# Patient Record
Sex: Female | Born: 1970 | Race: White | Hispanic: Yes | Marital: Married | Smoking: Never smoker
Health system: Southern US, Community
[De-identification: ages and names within clinical notes are randomized; demographics above are authoritative.]

## PROBLEM LIST (undated history)

## (undated) DIAGNOSIS — IMO0002 Reserved for concepts with insufficient information to code with codable children: Secondary | ICD-10-CM

## (undated) DIAGNOSIS — G4730 Sleep apnea, unspecified: Secondary | ICD-10-CM

## (undated) DIAGNOSIS — M5417 Radiculopathy, lumbosacral region: Secondary | ICD-10-CM

## (undated) DIAGNOSIS — R0789 Other chest pain: Secondary | ICD-10-CM

## (undated) DIAGNOSIS — E119 Type 2 diabetes mellitus without complications: Secondary | ICD-10-CM

## (undated) DIAGNOSIS — E1169 Type 2 diabetes mellitus with other specified complication: Secondary | ICD-10-CM

## (undated) DIAGNOSIS — R000 Tachycardia, unspecified: Secondary | ICD-10-CM

## (undated) DIAGNOSIS — E1143 Type 2 diabetes mellitus with diabetic autonomic (poly)neuropathy: Secondary | ICD-10-CM

## (undated) DIAGNOSIS — K219 Gastro-esophageal reflux disease without esophagitis: Secondary | ICD-10-CM

## (undated) DIAGNOSIS — I10 Essential (primary) hypertension: Secondary | ICD-10-CM

## (undated) DIAGNOSIS — K5900 Constipation, unspecified: Secondary | ICD-10-CM

## (undated) DIAGNOSIS — N390 Urinary tract infection, site not specified: Secondary | ICD-10-CM

## (undated) DIAGNOSIS — M545 Low back pain: Secondary | ICD-10-CM

## (undated) DIAGNOSIS — E669 Obesity, unspecified: Secondary | ICD-10-CM

## (undated) DIAGNOSIS — Z6841 Body Mass Index (BMI) 40.0 and over, adult: Secondary | ICD-10-CM

## (undated) HISTORY — DX: Essential (primary) hypertension: I10

## (undated) HISTORY — PX: OTHER SURGICAL HISTORY: SHX169

## (undated) HISTORY — DX: Urinary tract infection, site not specified: N39.0

## (undated) HISTORY — DX: Tachycardia, unspecified: R00.0

## (undated) HISTORY — DX: Sleep apnea, unspecified: G47.30

## (undated) HISTORY — DX: Radiculopathy, lumbosacral region: M54.17

## (undated) HISTORY — DX: Type 2 diabetes mellitus without complications: E11.9

## (undated) HISTORY — DX: Body Mass Index (BMI) 40.0 and over, adult: Z684

## (undated) HISTORY — DX: Gastro-esophageal reflux disease without esophagitis: K21.9

## (undated) HISTORY — DX: Obesity, unspecified: E66.9

## (undated) HISTORY — DX: Type 2 diabetes mellitus with other specified complication: E11.69

## (undated) HISTORY — DX: Low back pain: M54.5

## (undated) HISTORY — DX: Morbid (severe) obesity due to excess calories: E66.01

## (undated) HISTORY — DX: Other chest pain: R07.89

## (undated) HISTORY — DX: Constipation, unspecified: K59.00

## (undated) HISTORY — DX: Type 2 diabetes mellitus with diabetic autonomic (poly)neuropathy: E11.43

---

## 2004-05-20 ENCOUNTER — Ambulatory Visit: Payer: Self-pay | Admitting: Family Medicine

## 2004-07-01 ENCOUNTER — Ambulatory Visit: Payer: Self-pay | Admitting: Family Medicine

## 2004-07-13 ENCOUNTER — Ambulatory Visit: Payer: Self-pay | Admitting: Family Medicine

## 2004-11-29 ENCOUNTER — Ambulatory Visit: Payer: Self-pay | Admitting: Family Medicine

## 2004-12-01 ENCOUNTER — Ambulatory Visit: Payer: Self-pay | Admitting: Family Medicine

## 2005-10-19 ENCOUNTER — Encounter: Admission: RE | Admit: 2005-10-19 | Discharge: 2005-10-19 | Payer: Self-pay | Admitting: Specialist

## 2005-11-09 ENCOUNTER — Encounter: Admission: RE | Admit: 2005-11-09 | Discharge: 2005-11-09 | Payer: Self-pay | Admitting: Specialist

## 2005-11-16 ENCOUNTER — Encounter: Admission: RE | Admit: 2005-11-16 | Discharge: 2005-11-16 | Payer: Self-pay | Admitting: Specialist

## 2005-12-15 ENCOUNTER — Ambulatory Visit (HOSPITAL_COMMUNITY): Admission: RE | Admit: 2005-12-15 | Discharge: 2005-12-16 | Payer: Self-pay | Admitting: Specialist

## 2007-08-01 IMAGING — RF DG MYELOGRAM LUMBAR
14 of 18 series · 14 of 18 positions shown · IV contrast (omnipaque)
Comparison: none

CLINICAL DATA: Low back and bilateral leg pain.  
LUMBAR MYELOGRAM:
TECHNIQUE: Following informed consent, sterile preparation of the back, and adequate local anesthesia, a lumbar puncture was performed using a 22 gauge spinal needle at L2-3 from a right paramedian approach.  Fluid was clear and colorless.  15 cc of Omnipaque 180 was instilled in the subarachnoid space.
TECHNIQUE: Multidetector CT imaging of the lumbar spine was performed after intrathecal injection of contrast.  Multiplanar CT image reconstructions were also generated.

[Series 2: myelogram  white · 1 of 1 slices shown (1 of 14)]
[im 1/1]
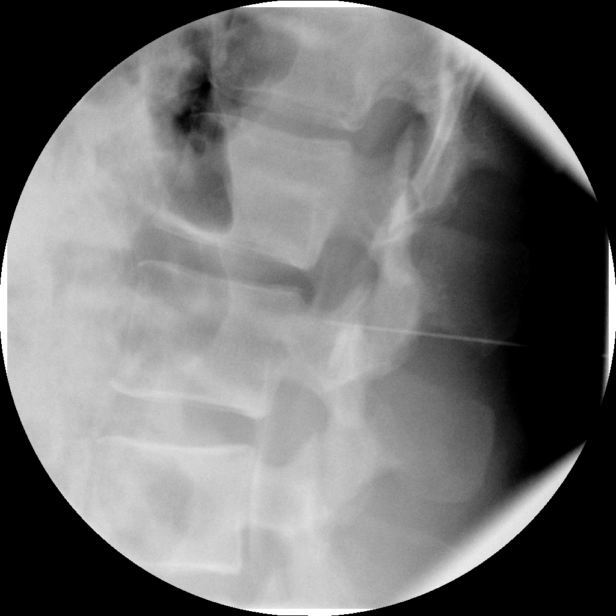

[Series 3: myelogram  white · 1 of 1 slices shown (2 of 14)]
[im 1/1]
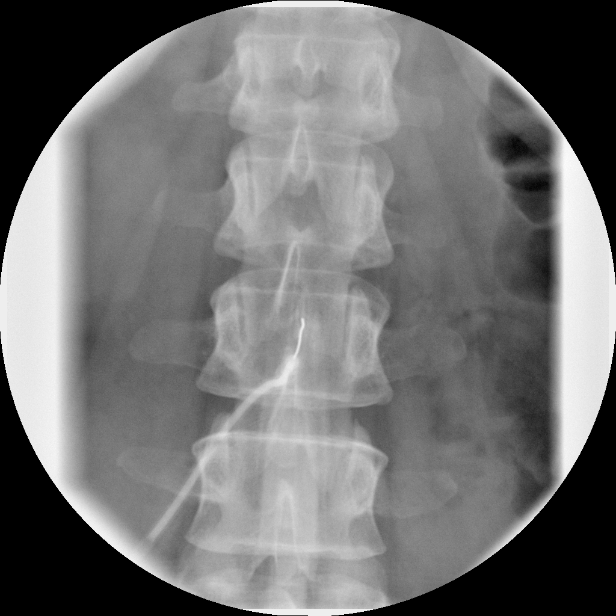

[Series 5: myelogram  white · 1 of 1 slices shown (3 of 14)]
[im 1/1]
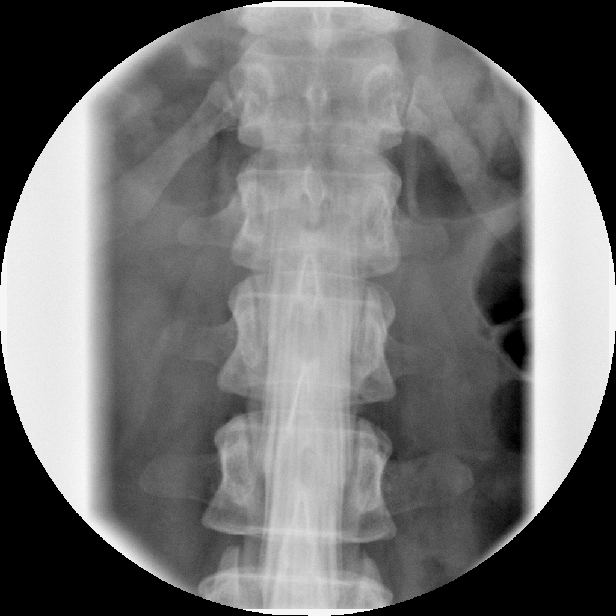

[Series 6: myelogram  white · 1 of 1 slices shown (4 of 14)]
[im 1/1]
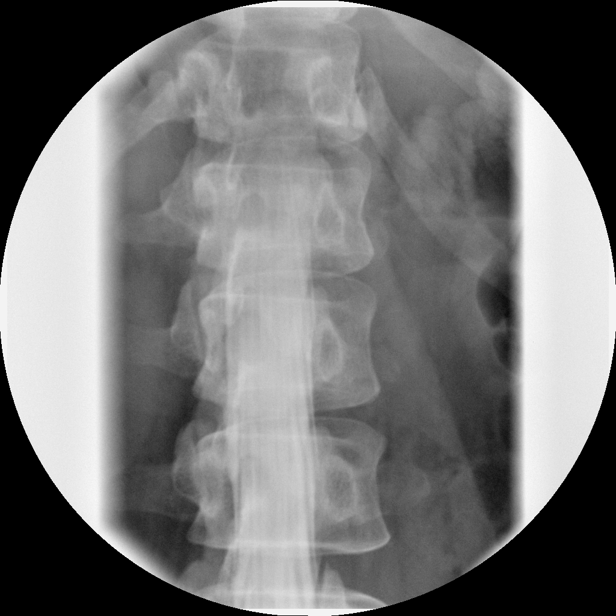

[Series 7: myelogram  white · 1 of 1 slices shown (5 of 14)]
[im 1/1]
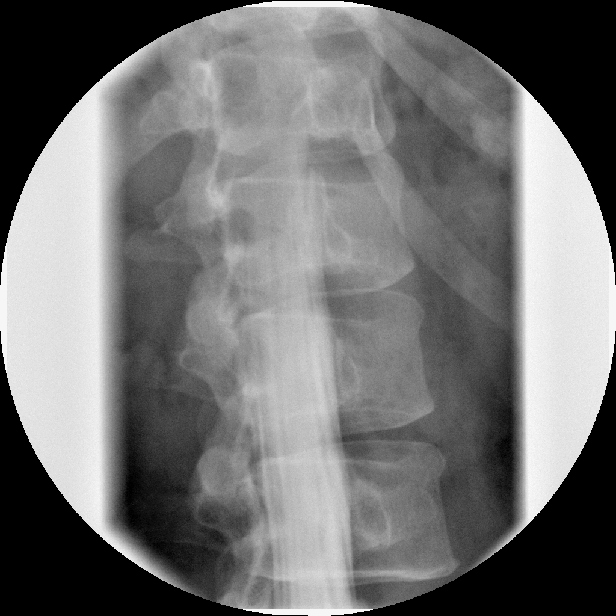

[Series 9: myelogram  white · 1 of 1 slices shown (6 of 14)]
[im 1/1]
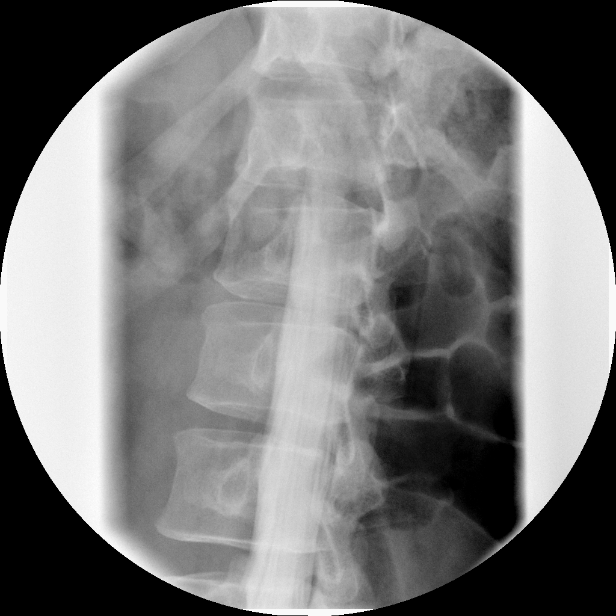

[Series 10: myelogram  white · 1 of 1 slices shown (7 of 14)]
[im 1/1]
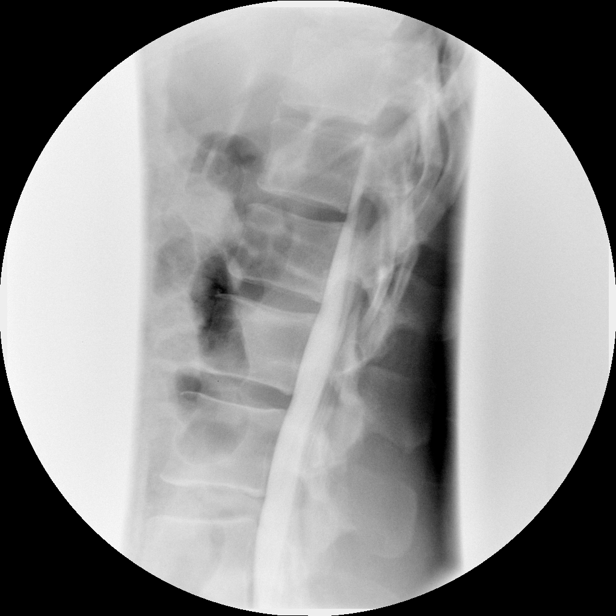

[Series 11: myelogram  white · 1 of 1 slices shown (8 of 14)]
[im 1/1]
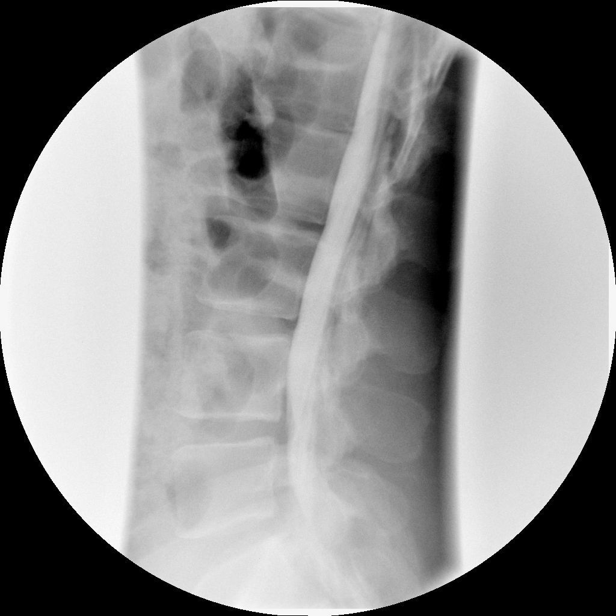

[Series 12: myelogram  white · 1 of 1 slices shown (9 of 14)]
[im 1/1]
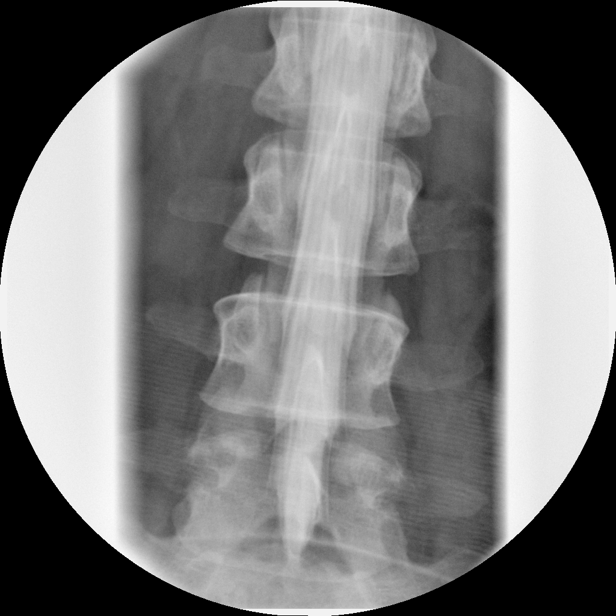

[Series 14: myelogram  white · 1 of 1 slices shown (10 of 14)]
[im 1/1]
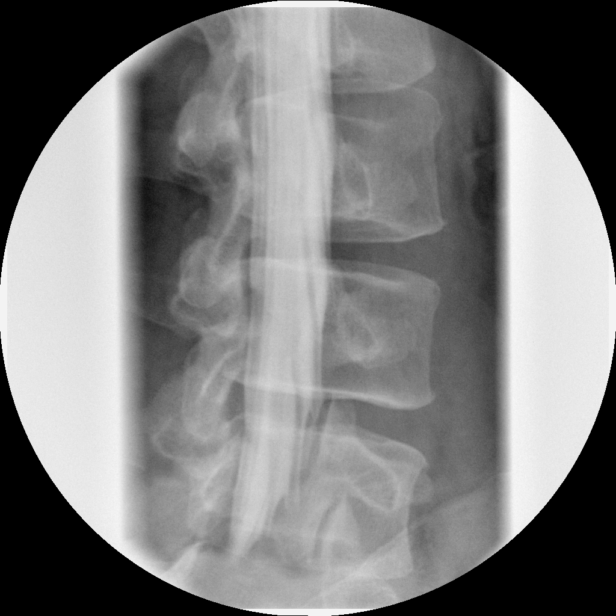

[Series 15: myelogram  white · 1 of 1 slices shown (11 of 14)]
[im 1/1]
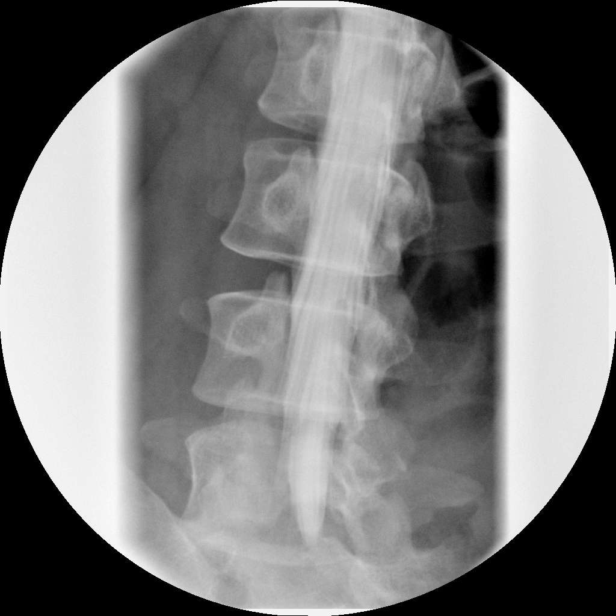

[Series 16: myelogram  white · 1 of 1 slices shown (12 of 14)]
[im 1/1]
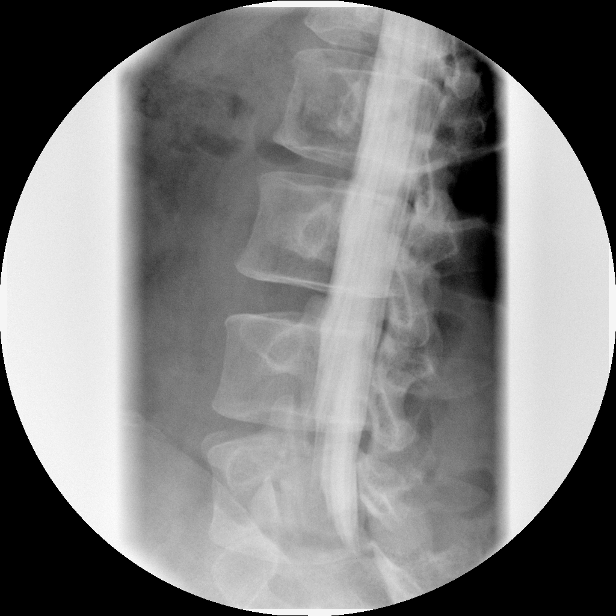

[Series 18: myelogram  white · 1 of 1 slices shown (13 of 14)]
[im 1/1]
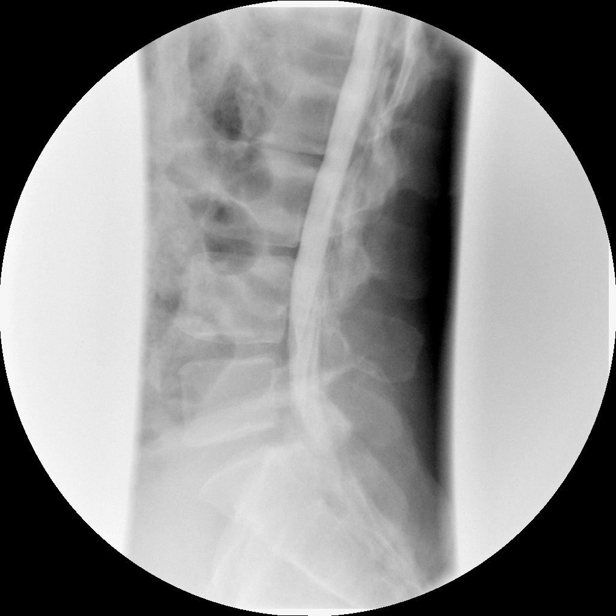

[Series 19: myelogram  white · 1 of 1 slices shown (14 of 14)]
[im 1/1]
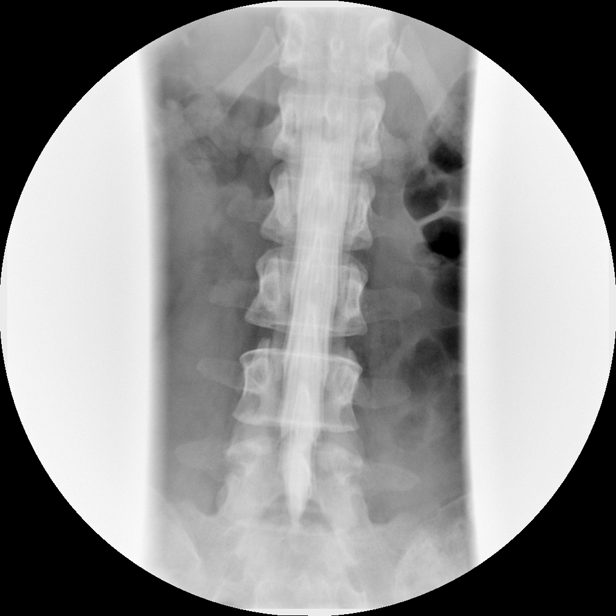

[14 of 18 positions shown; findings below may reference images not displayed]

FINDINGS: AP, lateral, and oblique views demonstrate bilateral extradural defects at L4-5 which appear relatively symmetric.  With the patient standing, the defect is larger on the right.  Side-to-side bending exacerbates the right sided defect when bending to that side.  Flexion/extensions show prominent ventral defects at L4-5 and 
L5-S1, but no abnormal movement or subluxation.
IMPRESSION: Axial loading results in increasing prominence of extradural defects at L4-5.  
POST MYELOGRAM CT SCAN OF THE LUMBAR SPINE:
FINDINGS: The disk spaces at L3-4 and above are normal.  
L4-5:  Calcified central disk protrusion with facet arthropathy.  It contacts, but not does significantly efface, either L5 nerve root.
L5-S1:  Shallow central disk protrusion.  No definite S1 nerve root encroachment.  Mild central osteophyte formation. 
Far right and left parasagittal images show some mild foraminal narrowing, but no significant neural compression.
IMPRESSION: 1.  Calcified central protrusion at L4-5 associated with facet arthropathy; this contacts, but does not significantly displace, the L5 nerve roots with the patient recumbent. 
2.  Central protrusion L5-S1 again without significant neural compression.

## 2007-08-01 IMAGING — CT CT L SPINE W/ CM
2 of 10 series · 10 of 27 positions shown, 12 images · IV contrast (omnipaque)
Comparison: none

CLINICAL DATA: Low back and bilateral leg pain.  
LUMBAR MYELOGRAM:
TECHNIQUE: Following informed consent, sterile preparation of the back, and adequate local anesthesia, a lumbar puncture was performed using a 22 gauge spinal needle at L2-3 from a right paramedian approach.  Fluid was clear and colorless.  15 cc of Omnipaque 180 was instilled in the subarachnoid space.
TECHNIQUE: Multidetector CT imaging of the lumbar spine was performed after intrathecal injection of contrast.  Multiplanar CT image reconstructions were also generated.

[Series 4: recon 3: l spine · axial · 0.27mm/px · z∈[-232,-92]mm · 5 of 351 slices shown, 7 images]
[im 59/351  soft-tissue]
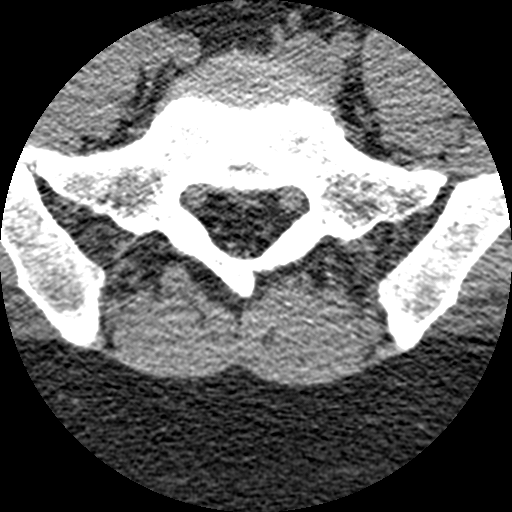
[im 59/351  bone]
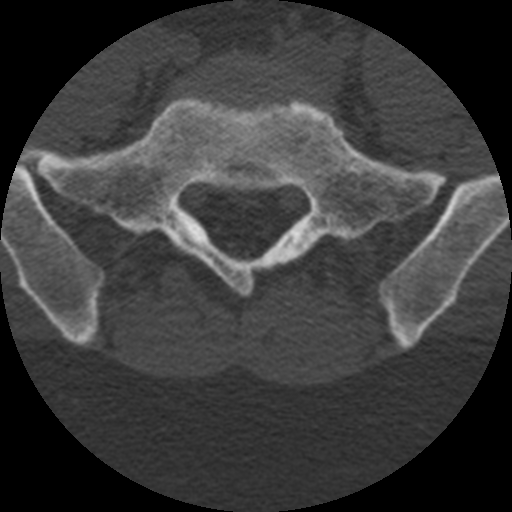
[im 117/351  bone]
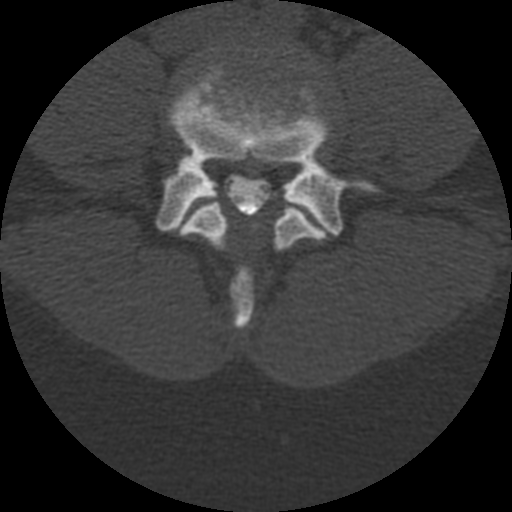
[im 176/351  bone]
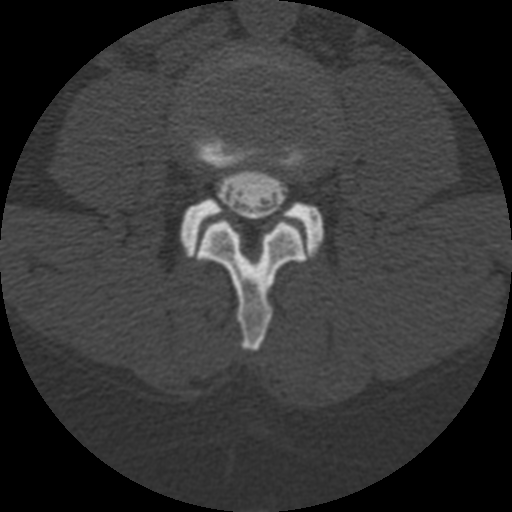
[im 234/351  bone]
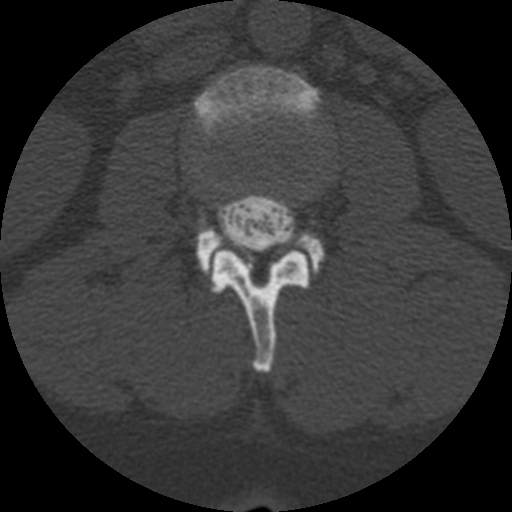
[im 292/351  soft-tissue]
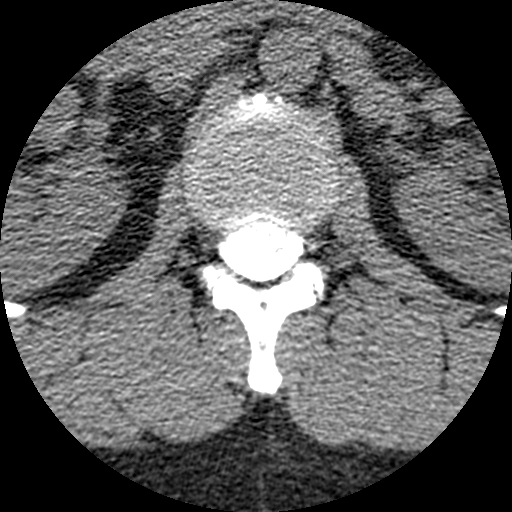
[im 292/351  bone]
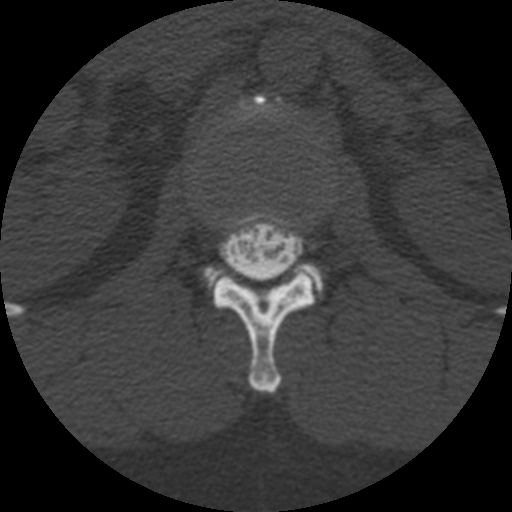

[Series 400: reformatted · coronal · 0.42mm/px · 5 of 40 slices shown]
[im 7/40  bone]
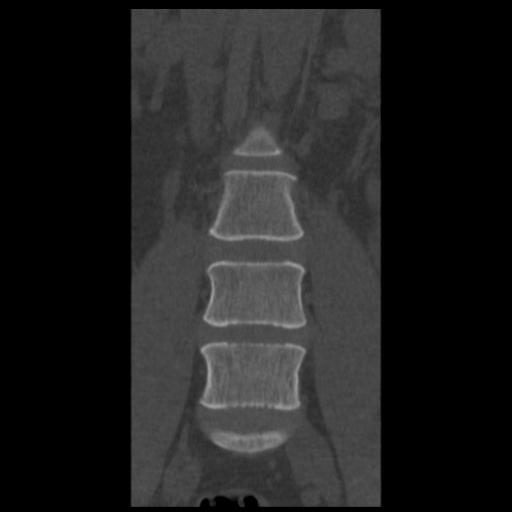
[im 14/40  bone]
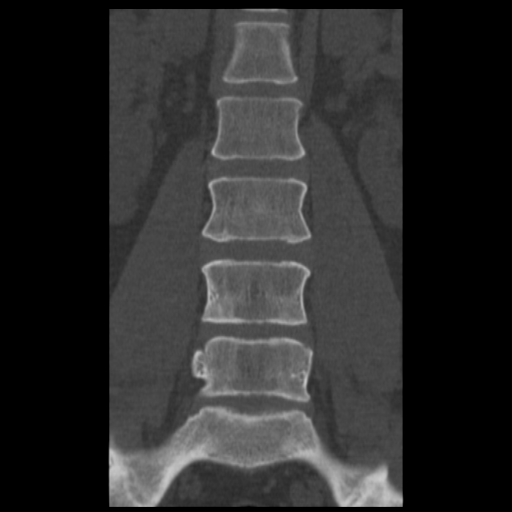
[im 20/40  bone]
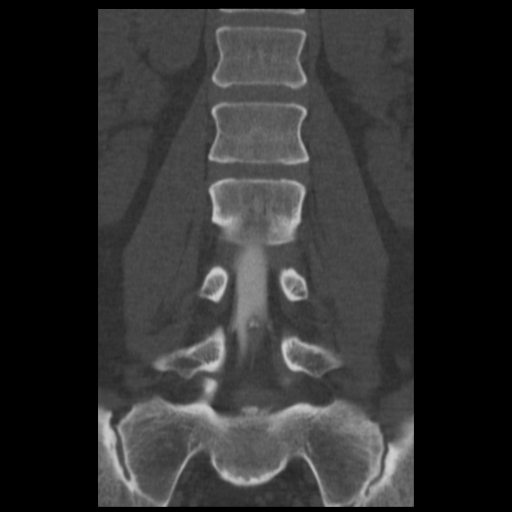
[im 27/40  bone]
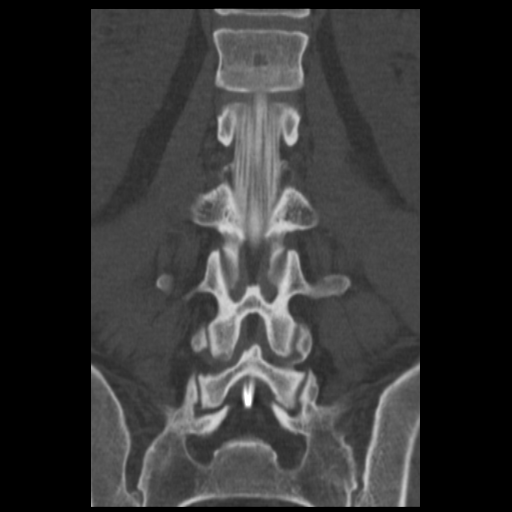
[im 33/40  bone]
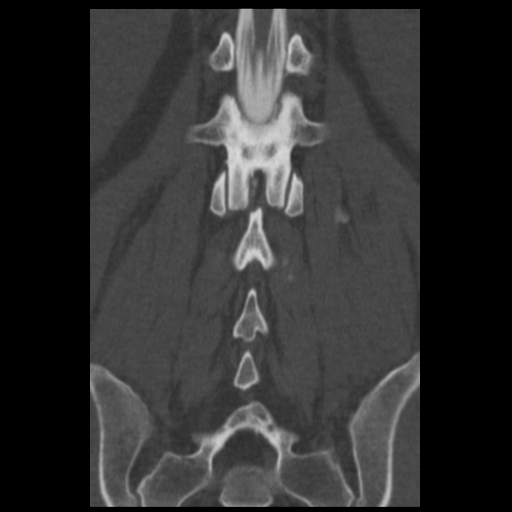

[10 of 27 positions shown; findings below may reference images not displayed]

FINDINGS: AP, lateral, and oblique views demonstrate bilateral extradural defects at L4-5 which appear relatively symmetric.  With the patient standing, the defect is larger on the right.  Side-to-side bending exacerbates the right sided defect when bending to that side.  Flexion/extensions show prominent ventral defects at L4-5 and 
L5-S1, but no abnormal movement or subluxation.
IMPRESSION: Axial loading results in increasing prominence of extradural defects at L4-5.  
POST MYELOGRAM CT SCAN OF THE LUMBAR SPINE:
FINDINGS: The disk spaces at L3-4 and above are normal.  
L4-5:  Calcified central disk protrusion with facet arthropathy.  It contacts, but not does significantly efface, either L5 nerve root.
L5-S1:  Shallow central disk protrusion.  No definite S1 nerve root encroachment.  Mild central osteophyte formation. 
Far right and left parasagittal images show some mild foraminal narrowing, but no significant neural compression.
IMPRESSION: 1.  Calcified central protrusion at L4-5 associated with facet arthropathy; this contacts, but does not significantly displace, the L5 nerve roots with the patient recumbent. 
2.  Central protrusion L5-S1 again without significant neural compression.

## 2011-08-07 ENCOUNTER — Inpatient Hospital Stay (HOSPITAL_COMMUNITY)
Admission: EM | Admit: 2011-08-07 | Discharge: 2011-08-15 | DRG: 460 | Disposition: A | Discharge status: Home / Self Care | Payer: 59 | Attending: Internal Medicine | Admitting: Internal Medicine

## 2011-08-07 ENCOUNTER — Encounter (HOSPITAL_COMMUNITY): Payer: Self-pay | Admitting: Physical Medicine and Rehabilitation

## 2011-08-07 DIAGNOSIS — D72829 Elevated white blood cell count, unspecified: Secondary | ICD-10-CM | POA: Diagnosis present

## 2011-08-07 DIAGNOSIS — M5459 Other low back pain: Secondary | ICD-10-CM

## 2011-08-07 DIAGNOSIS — I10 Essential (primary) hypertension: Secondary | ICD-10-CM | POA: Diagnosis present

## 2011-08-07 DIAGNOSIS — M545 Low back pain: Secondary | ICD-10-CM | POA: Diagnosis present

## 2011-08-07 DIAGNOSIS — M5126 Other intervertebral disc displacement, lumbar region: Principal | ICD-10-CM | POA: Diagnosis present

## 2011-08-07 DIAGNOSIS — K5900 Constipation, unspecified: Secondary | ICD-10-CM | POA: Diagnosis present

## 2011-08-07 DIAGNOSIS — N390 Urinary tract infection, site not specified: Secondary | ICD-10-CM | POA: Diagnosis present

## 2011-08-07 DIAGNOSIS — M549 Dorsalgia, unspecified: Secondary | ICD-10-CM

## 2011-08-07 DIAGNOSIS — M5432 Sciatica, left side: Secondary | ICD-10-CM

## 2011-08-07 DIAGNOSIS — M5417 Radiculopathy, lumbosacral region: Secondary | ICD-10-CM | POA: Diagnosis present

## 2011-08-07 HISTORY — DX: Other low back pain: M54.59

## 2011-08-07 HISTORY — DX: Reserved for concepts with insufficient information to code with codable children: IMO0002

## 2011-08-07 LAB — CBC WITH DIFFERENTIAL/PLATELET
Basophils Absolute: 0 10*3/uL (ref 0.0–0.1)
Eosinophils Relative: 0 % (ref 0–5)
Lymphocytes Relative: 25 % (ref 12–46)
Lymphs Abs: 4 10*3/uL (ref 0.7–4.0)
MCV: 85.1 fL (ref 78.0–100.0)
Neutro Abs: 11 10*3/uL — ABNORMAL HIGH (ref 1.7–7.7)
Neutrophils Relative %: 68 % (ref 43–77)
Platelets: 320 10*3/uL (ref 150–400)
RBC: 4.98 MIL/uL (ref 3.87–5.11)
RDW: 12.3 % (ref 11.5–15.5)
WBC: 16.2 10*3/uL — ABNORMAL HIGH (ref 4.0–10.5)

## 2011-08-07 LAB — BASIC METABOLIC PANEL
CO2: 24 mEq/L (ref 19–32)
Calcium: 8.7 mg/dL (ref 8.4–10.5)
Chloride: 104 mEq/L (ref 96–112)
Glucose, Bld: 86 mg/dL (ref 70–99)
Potassium: 3.2 mEq/L — ABNORMAL LOW (ref 3.5–5.1)
Sodium: 137 mEq/L (ref 135–145)

## 2011-08-07 MED ORDER — KETOROLAC TROMETHAMINE 30 MG/ML IJ SOLN
30.0000 mg | Freq: Once | INTRAMUSCULAR | Status: AC
  Administered 2011-08-07: 30 mg via INTRAVENOUS
  Filled 2011-08-07: qty 1

## 2011-08-07 MED ORDER — HYDROMORPHONE HCL PF 1 MG/ML IJ SOLN
0.5000 mg | INTRAMUSCULAR | Status: DC | PRN
Start: 2011-08-07 — End: 2011-08-11
  Administered 2011-08-08 – 2011-08-11 (×11): 0.5 mg via INTRAVENOUS
  Filled 2011-08-07 (×10): qty 1

## 2011-08-07 MED ORDER — HYDROMORPHONE HCL 2 MG PO TABS: 2.0000 mg | ORAL_TABLET | ORAL | Status: DC | PRN

## 2011-08-07 MED ORDER — HEPARIN SODIUM (PORCINE) 5000 UNIT/ML IJ SOLN
5000.0000 [IU] | Freq: Three times a day (TID) | INTRAMUSCULAR | Status: DC
  Administered 2011-08-08 – 2011-08-09 (×4): 5000 [IU] via SUBCUTANEOUS
  Filled 2011-08-07 (×7): qty 1

## 2011-08-07 MED ORDER — METHOCARBAMOL 100 MG/ML IJ SOLN: 1000.0000 mg | Freq: Once | INTRAMUSCULAR | Status: DC

## 2011-08-07 MED ORDER — NAPROXEN 500 MG PO TABS: 500.0000 mg | ORAL_TABLET | Freq: Two times a day (BID) | ORAL | Status: DC

## 2011-08-07 MED ORDER — FENTANYL 25 MCG/HR TD PT72
25.0000 ug | MEDICATED_PATCH | TRANSDERMAL | Status: DC
  Administered 2011-08-07 – 2011-08-10 (×2): 25 ug via TRANSDERMAL
  Filled 2011-08-07 (×2): qty 1

## 2011-08-07 MED ORDER — HYDROMORPHONE HCL PF 1 MG/ML IJ SOLN: 1.0000 mg | INTRAMUSCULAR | Status: DC | PRN

## 2011-08-07 MED ORDER — HYDROMORPHONE HCL PF 1 MG/ML IJ SOLN
1.0000 mg | Freq: Once | INTRAMUSCULAR | Status: AC
  Administered 2011-08-07: 1 mg via INTRAVENOUS
  Filled 2011-08-07: qty 1

## 2011-08-07 MED ORDER — HYDROMORPHONE HCL PF 1 MG/ML IJ SOLN
1.0000 mg | INTRAMUSCULAR | Status: AC | PRN
  Administered 2011-08-07 – 2011-08-08 (×2): 1 mg via INTRAVENOUS
  Filled 2011-08-07 (×4): qty 1

## 2011-08-07 MED ORDER — SODIUM CHLORIDE 0.9 % IV SOLN
Freq: Once | INTRAVENOUS | Status: AC
  Administered 2011-08-07: 17:00:00 via INTRAVENOUS

## 2011-08-07 MED ORDER — CYCLOBENZAPRINE HCL 10 MG PO TABS
10.0000 mg | ORAL_TABLET | Freq: Three times a day (TID) | ORAL | Status: DC | PRN
  Administered 2011-08-07 – 2011-08-08 (×2): 10 mg via ORAL
  Filled 2011-08-07 (×2): qty 1

## 2011-08-07 MED ORDER — PREDNISONE 20 MG PO TABS
20.0000 mg | ORAL_TABLET | Freq: Two times a day (BID) | ORAL | Status: DC
  Administered 2011-08-07 – 2011-08-11 (×8): 20 mg via ORAL
  Filled 2011-08-07 (×9): qty 1

## 2011-08-07 MED ORDER — KETOROLAC TROMETHAMINE 10 MG PO TABS
10.0000 mg | ORAL_TABLET | Freq: Four times a day (QID) | ORAL | Status: DC | PRN
  Administered 2011-08-09 – 2011-08-11 (×4): 10 mg via ORAL
  Filled 2011-08-07 (×4): qty 1

## 2011-08-07 MED ORDER — ONDANSETRON HCL 4 MG/2ML IJ SOLN: 4.0000 mg | Freq: Three times a day (TID) | INTRAMUSCULAR | Status: AC | PRN

## 2011-08-07 MED ORDER — METHOCARBAMOL 500 MG PO TABS: 1000.0000 mg | ORAL_TABLET | Freq: Four times a day (QID) | ORAL | Status: DC

## 2011-08-07 MED ORDER — METHOCARBAMOL 100 MG/ML IJ SOLN
1000.0000 mg | Freq: Once | INTRAVENOUS | Status: AC
  Administered 2011-08-07: 1000 mg via INTRAVENOUS
  Filled 2011-08-07: qty 10

## 2011-08-07 MED ORDER — ONDANSETRON HCL 4 MG/2ML IJ SOLN
4.0000 mg | Freq: Once | INTRAMUSCULAR | Status: AC
  Administered 2011-08-07: 4 mg via INTRAVENOUS
  Filled 2011-08-07: qty 2

## 2011-08-07 MED ORDER — DOCUSATE SODIUM 100 MG PO CAPS
100.0000 mg | ORAL_CAPSULE | Freq: Two times a day (BID) | ORAL | Status: DC
  Administered 2011-08-07 – 2011-08-11 (×8): 100 mg via ORAL
  Filled 2011-08-07 (×8): qty 1

## 2011-08-07 MED ORDER — ONDANSETRON HCL 4 MG/2ML IJ SOLN: 4.0000 mg | Freq: Four times a day (QID) | INTRAMUSCULAR | Status: DC | PRN

## 2011-08-07 MED ORDER — ONDANSETRON HCL 4 MG PO TABS: 4.0000 mg | ORAL_TABLET | Freq: Four times a day (QID) | ORAL | Status: DC | PRN

## 2011-08-07 MED ORDER — SENNA 8.6 MG PO TABS
1.0000 | ORAL_TABLET | Freq: Two times a day (BID) | ORAL | Status: DC
  Administered 2011-08-07 – 2011-08-11 (×8): 8.6 mg via ORAL
  Filled 2011-08-07 (×9): qty 1

## 2011-08-07 NOTE — ED Notes (Signed)
Report received from Donna, RN.

## 2011-08-07 NOTE — ED Notes (Signed)
Report given to [place], Charity fundraiser.  Pt moving to CDU.

## 2011-08-07 NOTE — H&P (Signed)
[facility]                                                                                    Patient Demographics  Jasmin Gordon, is a 41 y.o. female  CSN: [id number]  MRN: [id number]  DOB - 01-Feb-1971  Admit Date - 08/07/2011  Outpatient Primary MD for the patient is [facility] Valrie Hart., MD   With History of -  Past Medical History  Diagnosis Date  . Herniated disc       No past surgical history on file. C-section  Hysterectomy  in for   Chief Complaint  Patient presents with  . Back Pain  . Leg Pain     HPI  Jasmin Gordon  is a 41 y.o. 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 Hulda Humphrey at [facility], where she was given a 3 Depo shots in March April and May, with initial improvement of pain, but patient was supposed to follow with [facility] in [place] for second opinion for possible need of surgery but she did not have her appointment, vision denies any focal deficits any urine incontinence and stool incontinence and loss of sensation dysuria polyuria chest pain shortness of breath.   Review of Systems    In addition to the HPI above,  No Fever-chills, No Headache, No changes with Vision or hearing, No problems swallowing food or Liquids, No Chest pain, Cough or Shortness of Breath, No Abdominal pain, No Nausea or Vommitting, Bowel movements are regular, No Blood in stool or Urine, No dysuria, No new skin rashes  or bruises, Complaints of intractable lower back pain radiating to the left lower, denies any urine incontinence and stool incontinence or paresthesia, or focal deficit No new weakness, tingling, numbness in any extremity, No recent weight gain or loss, No polyuria, polydypsia or polyphagia, No significant Mental Stressors.  A full 10 point Review of Systems was done, except as stated above, all other Review of Systems were negative.   Social History History  Substance Use Topics  . Smoking status: Never Smoker   . Smokeless tobacco: Not on file  . Alcohol Use: No     Family History No family history on file.   Prior to Admission medications   Medication Sig Start Date End Date Taking? Authorizing Provider  predniSONE (DELTASONE) 20 MG tablet Take 20 mg by mouth 2 (two) times daily.   Yes Historical Provider, MD  HYDROmorphone (DILAUDID) 2 MG tablet Take 1 tablet (2 mg total) by mouth every 4 (four) hours as needed for pain. 08/07/11 08/17/11  Dione Booze, MD  methocarbamol (ROBAXIN) 500 MG tablet Take 2 tablets (1,000 mg total)  by mouth 4 (four) times daily. 08/07/11 08/17/11  Dione Booze, MD  naproxen (NAPROSYN) 500 MG tablet Take 1 tablet (500 mg total) by mouth 2 (two) times daily. 08/07/11 08/06/12  Dione Booze, MD    No Known Allergies  Physical Exam  Vitals  Blood pressure 149/86, pulse 87, temperature 98 F (36.7 C), temperature source Oral, resp. rate 24, SpO2 98.00%.   1. General obese female lying in bed in mild distress due to pain  2. Normal affect and insight, Not Suicidal or Homicidal, Awake Alert, Oriented X 3.  3. No F.N deficits, ALL C.Nerves Intact, Strength 5/5 all 4 extremities, Sensation intact all 4 extremities, Plantars down going. Movement is limited in left lower extremity due to pain, positive leg raise test  left leg, and has left paravertebral tenderness to palpation  4. Ears and Eyes appear Normal, Conjunctivae clear, PERRLA. Moist Oral Mucosa.  5.  Supple Neck, No JVD, No cervical lymphadenopathy appriciated, No Carotid Bruits.  6. Symmetrical Chest wall movement, Good air movement bilaterally, CTAB.  7. RRR, No Gallops, Rubs or Murmurs, No Parasternal Heave.  8. Positive Bowel Sounds, Abdomen Soft, Non tender, No organomegaly appriciated,No rebound -guarding or rigidity.  9.  No Cyanosis, Normal Skin Turgor, No Skin Rash or Bruise.  10. Good muscle tone,  joints appear normal , no effusions, .  11. No Palpable Lymph Nodes in Neck or Axillae   Data Review  CBC No results found for this basename: WBC:5,HGB:5,HCT:5,PLT:5,MCV:5,MCH:5,MCHC:5,RDW:5,NEUTRABS:5,LYMPHSABS:5,MONOABS:5,EOSABS:5,BASOSABS:5,BANDABS:5,BANDSABD:5 in the last 168 hours ------------------------------------------------------------------------------------------------------------------  Chemistries  No results found for this basename: NA:5,K:5,CL:5,CO2:5,GLUCOSE:5,BUN:5,CREATININE:5,GFRCGP,:5,CALCIUM:5,MG:5,AST:5,ALT:5,ALKPHOS:5,BILITOT:5 in the last 168 hours ------------------------------------------------------------------------------------------------------------------ CrCl is unknown because no creatinine reading has been taken and the patient has no height on file. ------------------------------------------------------------------------------------------------------------------ No results found for this basename: TSH,T4TOTAL,FREET3,T3FREE,THYROIDAB in the last 72 hours   Coagulation profile No results found for this basename: INR:5,PROTIME:5 in the last 168 hours ------------------------------------------------------------------------------------------------------------------- No results found for this basename: DDIMER:2 in the last 72 hours -------------------------------------------------------------------------------------------------------------------  Cardiac Enzymes No results found for this basename: CK:3,CKMB:3,TROPONINI:3,MYOGLOBIN:3 in the last  168 hours ------------------------------------------------------------------------------------------------------------------ No components found with this basename: POCBNP:3   ---------------------------------------------------------------------------------------------------------------  Urinalysis No results found for this basename: colorurine, appearanceur, labspec, phurine, glucoseu, hgbur, bilirubinur, ketonesur, proteinur, urobilinogen, nitrite, leukocytesur    ----------------------------------------------------------------------------------------------------------------    Imaging results:   No results found.   Assessment & Plan  Principal Problem:  *Intractable low back pain    1. intractable back pain. Patient has history of disc desiccation in L4-L5, L5-S1, status post left laminectomy in L4, with recurrent lower back pain, status post 3 Depo shots, recently started on by mouth prednisone, 20 daily last Wednesday, will continue her on when necessary Dilauded, continue her on Flexeril when necessary, and oral toradol prn, and we'll start her on fentanyl patch, and if pain is hard to control will consult [facility] neurosurgery.    DVT Prophylaxis Heparin  AM Labs Ordered, also please review Full Orders  Admission, patients condition and plan of care including tests being ordered have been discussed with the patient  who indicate understanding and agree with the plan and Code Status.  Code Status full  Condition fair  Brie Eppard M.D on 08/07/2011 at 7:45 PM

## 2011-08-07 NOTE — ED Notes (Addendum)
Pt presents to department for evaluation of lower back pain and L leg pain. States she lost her balance and fell this morning, was able to stop herself with hands. Pt states history of herniated disk. 10/10 pain upon arrival to ED. Pt is tearful and crying. No obvious deformities noted. She is conscious alert and oriented x4. Pt states she recently went to [facility] for same recently, no relief from pain with steroid or muscle relaxer.

## 2011-08-07 NOTE — ED Provider Notes (Signed)
History   This chart was scribed for Dione Booze, MD scribed by Magnus Sinning. The patient was seen in room TR11C/TR11C seen at 13:32   CSN: [id number]  Arrival date & time 08/07/11  1210   None     Chief Complaint  Patient presents with  . Back Pain  . Leg Pain    (Consider location/radiation/quality/duration/timing/severity/associated sxs/prior treatment) HPI Jasmin Gordon is a 41 y.o. female who presents to the Emergency Department complaining of gradually worsening severe burning, throbbing, and aching lower left back pain that radiates into left leg, onset 1 week. Also reports associated weakness. She explains back pain wasn't bad initially, but says now it is aggravated severely with walking and sitting. She states it used to be modified by laying down, but says it now causes pain.  She states she was prescribed oxycodone to take for chronic back pain, but states it has not worked because it just makes her sleep. Patient states she was dxd with herniated disk with Dr. Kathrene Bongo approximately 5 months ago. She says she has been treated with depo shots, which she explains worked very well. However, she states orthopedist also noted possible need for surgery. Patient does not smoke or consume ETOH. Denies incontinence.  Past Medical History  Diagnosis Date  . Herniated disc     No past surgical history on file.  No family history on file.  History  Substance Use Topics  . Smoking status: Never Smoker   . Smokeless tobacco: Not on file  . Alcohol Use: No   Review of Systems  Musculoskeletal: Positive for back pain.       Leg pain    Allergies  Review of patient's allergies indicates no known allergies.  Home Medications  No current outpatient prescriptions on file.  BP 169/115  Pulse 101  Temp 97 F (36.1 C) (Oral)  Resp 22  SpO2 96%  Physical Exam  Nursing note and vitals reviewed. Constitutional: She is oriented to person, place, and time. She appears  well-developed and well-nourished. No distress.       In pain  HENT:  Head: Normocephalic and atraumatic.  Eyes: EOM are normal.  Neck: Neck supple. No tracheal deviation present.  Cardiovascular: Normal rate.   Pulmonary/Chest: Effort normal. No respiratory distress.  Musculoskeletal: Normal range of motion. She exhibits tenderness.       Mild tenderness to lumbar spine with left para lumbar tenderness and spasm. Positive straight leg raise on the left at 30 degrees. Positive straight leg raise on the right at 45 degrees  Neurological: She is alert and oriented to person, place, and time.  Skin: Skin is warm and dry.  Psychiatric: She has a normal mood and affect. Her behavior is normal.    ED Course  Procedures (including critical care time) DIAGNOSTIC STUDIES: Oxygen Saturation is 96% on room air, normal by my interpretation.    COORDINATION OF CARE:  15:22:EDMD performs recheck. Patient reports feeling better and is ready for d/c.  17:06: EDMD notes: Patient unable to get out of bed and will need further evaluation.   17:09: EDMD performs recheck. Patient reports that she is still in pain. EDMD also provides options for treatment and possible plans for admittance for further EVAL.   18:52: EDMD provides that patient did not have relief with Dilaudid injections and provides plan to admit for further eval.  Results for orders placed during the hospital encounter of 08/07/11  CBC WITH DIFFERENTIAL  Component Value Range   WBC 16.2 (*) 4.0 - 10.5 K/uL   RBC 4.98  3.87 - 5.11 MIL/uL   Hemoglobin 14.9  12.0 - 15.0 g/dL   HCT 16.1  09.6 - 04.5 %   MCV 85.1  78.0 - 100.0 fL   MCH 29.9  26.0 - 34.0 pg   MCHC 35.1  30.0 - 36.0 g/dL   RDW 40.9  81.1 - 91.4 %   Platelets 320  150 - 400 K/uL   Neutrophils Relative 68  43 - 77 %   Neutro Abs 11.0 (*) 1.7 - 7.7 K/uL   Lymphocytes Relative 25  12 - 46 %   Lymphs Abs 4.0  0.7 - 4.0 K/uL   Monocytes Relative 7  3 - 12 %    Monocytes Absolute 1.1 (*) 0.1 - 1.0 K/uL   Eosinophils Relative 0  0 - 5 %   Eosinophils Absolute 0.0  0.0 - 0.7 K/uL   Basophils Relative 0  0 - 1 %   Basophils Absolute 0.0  0.0 - 0.1 K/uL  BASIC METABOLIC PANEL      Component Value Range   Sodium 137  135 - 145 mEq/L   Potassium 3.2 (*) 3.5 - 5.1 mEq/L   Chloride 104  96 - 112 mEq/L   CO2 24  19 - 32 mEq/L   Glucose, Bld 86  70 - 99 mg/dL   BUN 11  6 - 23 mg/dL   Creatinine, Ser 7.82 (*) 0.50 - 1.10 mg/dL   Calcium 8.7  8.4 - 95.6 mg/dL   GFR calc non Af Amer >90  >90 mL/min   GFR calc Af Amer >90  >90 mL/min  URINALYSIS, ROUTINE W REFLEX MICROSCOPIC      Component Value Range   Color, Urine YELLOW  YELLOW   APPearance CLOUDY (*) CLEAR   Specific Gravity, Urine 1.024  1.005 - 1.030   pH 6.0  5.0 - 8.0   Glucose, UA NEGATIVE  NEGATIVE mg/dL   Hgb urine dipstick TRACE (*) NEGATIVE   Bilirubin Urine NEGATIVE  NEGATIVE   Ketones, ur NEGATIVE  NEGATIVE mg/dL   Protein, ur NEGATIVE  NEGATIVE mg/dL   Urobilinogen, UA 0.2  0.0 - 1.0 mg/dL   Nitrite POSITIVE (*) NEGATIVE   Leukocytes, UA MODERATE (*) NEGATIVE  URINE MICROSCOPIC-ADD ON      Component Value Range   Squamous Epithelial / LPF FEW (*) RARE   WBC, UA 11-20  <3 WBC/hpf   RBC / HPF 0-2  <3 RBC/hpf   Bacteria, UA MANY (*) RARE  URINE CULTURE      Component Value Range   Specimen Description URINE, CLEAN CATCH     Special Requests NONE     Culture  Setup Time 08/09/2011 06:13     Colony Count 20,OOO COLONIES/ML     Culture       Value: Multiple bacterial morphotypes present, none predominant. Suggest appropriate recollection if clinically indicated.   Report Status 08/10/2011 FINAL    CBC      Component Value Range   WBC 15.6 (*) 4.0 - 10.5 K/uL   RBC 5.25 (*) 3.87 - 5.11 MIL/uL   Hemoglobin 15.6 (*) 12.0 - 15.0 g/dL   HCT 21.3  08.6 - 57.8 %   MCV 84.2  78.0 - 100.0 fL   MCH 29.7  26.0 - 34.0 pg   MCHC 35.3  30.0 - 36.0 g/dL   RDW 46.9  62.9 - 52.8 %  Platelets 346  150 - 400 K/uL  BASIC METABOLIC PANEL      Component Value Range   Sodium 136  135 - 145 mEq/L   Potassium 3.3 (*) 3.5 - 5.1 mEq/L   Chloride 99  96 - 112 mEq/L   CO2 24  19 - 32 mEq/L   Glucose, Bld 171 (*) 70 - 99 mg/dL   BUN 13  6 - 23 mg/dL   Creatinine, Ser 6.44 (*) 0.50 - 1.10 mg/dL   Calcium 9.5  8.4 - 03.4 mg/dL   GFR calc non Af Amer >90  >90 mL/min   GFR calc Af Amer >90  >90 mL/min  CBC      Component Value Range   WBC 14.3 (*) 4.0 - 10.5 K/uL   RBC 5.66 (*) 3.87 - 5.11 MIL/uL   Hemoglobin 16.7 (*) 12.0 - 15.0 g/dL   HCT 74.2 (*) 59.5 - 63.8 %   MCV 85.0  78.0 - 100.0 fL   MCH 29.5  26.0 - 34.0 pg   MCHC 34.7  30.0 - 36.0 g/dL   RDW 75.6  43.3 - 29.5 %   Platelets 367  150 - 400 K/uL  BASIC METABOLIC PANEL      Component Value Range   Sodium 138  135 - 145 mEq/L   Potassium 4.4  3.5 - 5.1 mEq/L   Chloride 99  96 - 112 mEq/L   CO2 24  19 - 32 mEq/L   Glucose, Bld 149 (*) 70 - 99 mg/dL   BUN 14  6 - 23 mg/dL   Creatinine, Ser 1.88 (*) 0.50 - 1.10 mg/dL   Calcium 9.8  8.4 - 41.6 mg/dL   GFR calc non Af Amer >90  >90 mL/min   GFR calc Af Amer >90  >90 mL/min   Mr Lumbar Spine W Wo Contrast  08/09/2011  *RADIOLOGY REPORT*  Clinical Data: Increasing low back pain and left lower extremity radiculopathy.  MRI LUMBAR SPINE WITHOUT AND WITH CONTRAST  Technique:  Multiplanar and multiecho pulse sequences of the lumbar spine were obtained without and with intravenous contrast.  Contrast: 20mL MULTIHANCE GADOBENATE DIMEGLUMINE 529 MG/ML IV SOLN  Comparison: MRI dated 03/11/2011 and radiographs dated 03/06/2011  Findings: Scan extends from T11-12 through S4.  Tip of the conus is at L1-2 with a tiny lipoma of the filum terminalis.  Normal paraspinal soft tissues.  T10-11 through L3-4: Normal.  L4-5:  There is a recurrent disc extrusion into the left lateral recess compressing the left L5 nerve root sleeve.  Evidence of previous left hemilaminotomy.  After contrast  administration there is slight enhancement around the thecal sac.  The extrusion is new since 03/11/2011.  L5-S1:  There is a focal central disc protrusion which does not touch the nerve roots or thecal sac.  There is enhancing epidural fibrosis around the left S1 nerve root sleeve.  This does have a slight mass effect upon the left S1 nerve root sleeve.  IMPRESSION:  1.  New disc extrusion into the left lateral recess at L4-5 compressing the left L5 nerve root. 2.  Small central disc protrusion at L5-S1 without neural impingement. 3.  Enhancing scar tissue around the left S1 nerve root sleeve at L5-S1.  Original Report Authenticated By: Gwynn Burly, M.D.      1. Sciatica of left side       MDM  Severe lumbar pain. She will be given treatment with IV hydromorphone and methocarbamol and reevaluated.  No relief despite multiple injections of hydromorphone and also methocarbamol. I have discussed with the radiologist was able to get her MRI from [facility] and she has severe multilevel disc disease. She'll need to be admitted for pain control and neurosurgical consultation. Case is discussed with [facility] who agreed to admit the patient.  I personally performed the services described in this documentation, which was scribed in my presence. The recorded information has been reviewed and considered.           Dione Booze, MD 08/11/11 [phone]

## 2011-08-08 ENCOUNTER — Observation Stay (HOSPITAL_COMMUNITY): Payer: 59

## 2011-08-08 DIAGNOSIS — M5417 Radiculopathy, lumbosacral region: Secondary | ICD-10-CM

## 2011-08-08 DIAGNOSIS — M5430 Sciatica, unspecified side: Secondary | ICD-10-CM

## 2011-08-08 DIAGNOSIS — M545 Low back pain, unspecified: Secondary | ICD-10-CM

## 2011-08-08 DIAGNOSIS — N390 Urinary tract infection, site not specified: Secondary | ICD-10-CM | POA: Diagnosis present

## 2011-08-08 HISTORY — DX: Radiculopathy, lumbosacral region: M54.17

## 2011-08-08 HISTORY — DX: Urinary tract infection, site not specified: N39.0

## 2011-08-08 LAB — URINALYSIS, ROUTINE W REFLEX MICROSCOPIC
Glucose, UA: NEGATIVE mg/dL
Protein, ur: NEGATIVE mg/dL
Specific Gravity, Urine: 1.024 (ref 1.005–1.030)

## 2011-08-08 LAB — URINE MICROSCOPIC-ADD ON

## 2011-08-08 MED ORDER — PANTOPRAZOLE SODIUM 40 MG PO TBEC
40.0000 mg | DELAYED_RELEASE_TABLET | Freq: Every day | ORAL | Status: DC
  Administered 2011-08-08 – 2011-08-11 (×4): 40 mg via ORAL
  Filled 2011-08-08 (×3): qty 1

## 2011-08-08 MED ORDER — GADOBENATE DIMEGLUMINE 529 MG/ML IV SOLN
20.0000 mL | Freq: Once | INTRAVENOUS | Status: AC | PRN
  Administered 2011-08-08: 20 mL via INTRAVENOUS

## 2011-08-08 MED ORDER — CIPROFLOXACIN HCL 500 MG PO TABS
500.0000 mg | ORAL_TABLET | Freq: Two times a day (BID) | ORAL | Status: DC
  Administered 2011-08-08 – 2011-08-11 (×7): 500 mg via ORAL
  Filled 2011-08-08 (×11): qty 1

## 2011-08-08 MED ORDER — IBUPROFEN 600 MG PO TABS
600.0000 mg | ORAL_TABLET | Freq: Three times a day (TID) | ORAL | Status: DC
  Administered 2011-08-08 – 2011-08-09 (×3): 600 mg via ORAL
  Filled 2011-08-08 (×7): qty 1

## 2011-08-08 MED ORDER — CYCLOBENZAPRINE HCL 5 MG PO TABS
5.0000 mg | ORAL_TABLET | Freq: Three times a day (TID) | ORAL | Status: DC
  Administered 2011-08-08 – 2011-08-11 (×10): 5 mg via ORAL
  Filled 2011-08-08 (×13): qty 1

## 2011-08-08 NOTE — Progress Notes (Signed)
Utilization review complete 

## 2011-08-08 NOTE — Progress Notes (Signed)
Subjective: Still has considerable back and left leg pain.  Objective: Vital signs in last 24 hours: Temp:  [97 F (36.1 C)-98.9 F (37.2 C)] 98.4 F (36.9 C) (07/08 0522) Pulse Rate:  [87-101] 90  (07/08 0522) Resp:  [16-24] 16  (07/08 0522) BP: (134-169)/(85-115) 148/90 mmHg (07/08 0522) SpO2:  [95 %-98 %] 95 % (07/08 0522) Weight:  [107.956 kg (238 lb)] 107.956 kg (238 lb) (07/07 2307) Weight change:  Last BM Date: 08/06/11  Intake/Output from previous day:       Physical Exam: General:  Alert, communicative, fully oriented, not short of breath at rest.  HEENT:  No clinical pallor, no jaundice, no conjunctival injection or discharge. Hydration status is satisfactory. NECK:  Supple, JVP not seen, no carotid bruits, no palpable lymphadenopathy, no palpable goiter. CHEST:  Clinically clear to auscultation, no wheezes, no crackles. HEART:  Sounds 1 and 2 heard, normal, regular, no murmurs. ABDOMEN:  Morbidly obese, soft, non-tender, no palpable organomegaly, no palpable masses, normal bowel sounds. GENITALIA:  Not examined. LOWER EXTREMITIES:  No pitting edema, palpable peripheral pulses. MUSCULOSKELETAL SYSTEM:  Has tenderness lower lumbar/sacral spine, with left paraspinal muscle spasm. Unable to attempt straight-leg raising, secondary to significant pain down back of left leg, on slightest movement. CENTRAL NERVOUS SYSTEM:  No focal neurologic deficit on gross examination.  Lab Results:  [facility] 08/07/11 1917  WBC 16.2*  HGB 14.9  HCT 42.4  PLT 320    Basename 08/07/11 1917  NA 137  K 3.2*  CL 104  CO2 24  GLUCOSE 86  BUN 11  CREATININE 0.45*  CALCIUM 8.7   No results found for this or any previous visit (from the past 240 hour(s)).   Studies/Results: No results found.  Medications: Scheduled Meds:   . sodium chloride   Intravenous Once  . docusate sodium  100 mg Oral BID  . fentaNYL  25 mcg Transdermal Q72H  . heparin  5,000 Units Subcutaneous Q8H    . HYDROmorphone  1 mg Intravenous Once  . HYDROmorphone  1 mg Intravenous Once  . HYDROmorphone  1 mg Intravenous Once  . ketorolac  30 mg Intravenous Once  . methocarbamol (ROBAXIN) IV  1,000 mg Intravenous Once  . ondansetron (ZOFRAN) IV  4 mg Intravenous Once  . predniSONE  20 mg Oral BID  . senna  1 tablet Oral BID  . DISCONTD: methocarbamol  1,000 mg Intravenous Once   Continuous Infusions:  PRN Meds:.cyclobenzaprine, HYDROmorphone, HYDROmorphone (DILAUDID) injection, ketorolac, ondansetron (ZOFRAN) IV, ondansetron (ZOFRAN) IV, ondansetron, DISCONTD:  HYDROmorphone (DILAUDID) injection  Assessment/Plan:  Active Problems: 1. Intractable low back pain/Left radiculopathy:  Patient has history of disc desiccation in L4-L5, L5-S1, status post left laminectomy in L4. Since the beginning of this year, she has had recurrent lower back pain, and is now status post status 3 Depo shots. Oral prednisone was started on 08/03/11. Symptoms have steadily progressed, and she is now unable to ambulate. Clinically, she has a radiculopathy. Managing with analgesics, and muscle relaxants. Will do MRI, to define anatomy, and consult neurosurgery, dependent on MRI findings. PT/OT, as tolerated.  2. UTI: Patient has a positive urinary sediment, with pyuria and bacteriuria. Will start oral ciprofloxacin, and await urine culture.     LOS: 1 day   Nihar Klus,CHRISTOPHER 08/08/2011, 10:34 AM

## 2011-08-08 NOTE — Progress Notes (Signed)
Physical Therapy Evaluation Patient Details Name: Jasmin Gordon MRN: [id number] DOB: 25-May-1970 Today's Date: 08/08/2011 Time: 4098-1191 PT Time Calculation (min): 27 min  PT Assessment / Plan / Recommendation Clinical Impression  Pt is a 41 yo female with past history of lumbar laminectomy and disc herniation L3-4 on MRI in 4/13 who presents with worsening of LBP and LLE pain as well as elevated WBC.  Pt is limited in all activity by pain.  Was able to position her in prone on her bed which did help relive pain slightly, all other positions aggravated pain.  Ice applied to low back.  Difficult to make d/c recommendations at this point since it is unclear how she will be managed but she will need acute PT for mobility in this setting as well as some sort of f/u PT (HH vs OP) depending on hospital course.      PT Assessment  Patient needs continued PT services    Follow Up Recommendations  Home health PT;Outpatient PT;Other (comment) (depending on mgmt of back pain)    Barriers to Discharge None      Equipment Recommendations  Other (comment) (TBD)    Recommendations for Other Services OT consult   Frequency Min 5X/week    Precautions / Restrictions Precautions Precautions: Back;Fall Precaution Booklet Issued: No Precaution Comments: precautions not formal as pt has not had back surgery but eduacted for safe mobility with back pain Restrictions Weight Bearing Restrictions: No   Pertinent Vitals/Pain 8/10 back and LLE pain      Mobility  Bed Mobility Bed Mobility: Rolling Right;Rolling Left Rolling Right: 5: Supervision;With rail Rolling Left: 5: Supervision;With rail Details for Bed Mobility Assistance: vc's to log roll and prevent twisting Ambulation/Gait Ambulation/Gait Assistance Details: pt unable to ambulate on eval as she had just returned from the bathroom, per her report, she is needing HHA of 1 for support during ambulation and has been ambulating with the help of her  daughter Stairs: No Wheelchair Mobility Wheelchair Mobility: No    Exercises Other Exercises Other Exercises: gentle traction given to LLE and bilateral LE's without relief of pain. 30 sec Other Exercises: prone positioning performed in bed which did help decrease pt's pain to a more manageable level and then ice applied to the low back. Other Exercises: soft tissue massage to left piriformis and distal ITB with some relief of symptoms   PT Diagnosis: Difficulty walking;Acute pain  PT Problem List: Decreased strength;Decreased activity tolerance;Decreased mobility;Decreased knowledge of use of DME;Decreased knowledge of precautions;Pain PT Treatment Interventions: DME instruction;Gait training;Stair training;Functional mobility training;Therapeutic activities;Therapeutic exercise;Patient/family education   PT Goals Acute Rehab PT Goals PT Goal Formulation: With patient Time For Goal Achievement: 08/15/11 Potential to Achieve Goals: Good Pt will go Supine/Side to Sit: with modified independence PT Goal: Supine/Side to Sit - Progress: Goal set today Pt will go Sit to Supine/Side: with modified independence PT Goal: Sit to Supine/Side - Progress: Goal set today Pt will go Sit to Stand: with modified independence PT Goal: Sit to Stand - Progress: Goal set today Pt will go Stand to Sit: with modified independence PT Goal: Stand to Sit - Progress: Goal set today Pt will Ambulate: >150 feet;with modified independence;with least restrictive assistive device PT Goal: Ambulate - Progress: Goal set today Pt will Go Up / Down Stairs: 1-2 stairs;with supervision;with rail(s) PT Goal: Up/Down Stairs - Progress: Goal set today  Visit Information  Last PT Received On: 08/08/11 Assistance Needed: +1    Subjective Data  Subjective: I was standing in my kitchen cooking when the terrible pain started Patient Stated Goal: decreased pain   Prior Functioning  Home Living Lives With: Spouse;Family  (3 children at home, 2 grown with 1 grandchild) Available Help at Discharge: Family;Available 24 hours/day Type of Home: House Home Access: Stairs to enter [employer] of Steps: 2 Home Layout: One level Home Adaptive Equipment: Straight cane Additional Comments: per pt report, she had laminectomy of 2 lumbar levels with good results but then began having pain again and was receiving injections which were helping until a few days ago when pain became unbearable with left leg pain and weakness  Prior Function Level of Independence: Independent Able to Take Stairs?: Yes Driving: Yes Vocation: Full time employment Comments: works at Nurse, adult: No difficulties;Other (comment) (primary language Spanish but speaks [place] well)    Cognition  Overall Cognitive Status: Appears within functional limits for tasks assessed/performed Arousal/Alertness: Awake/alert Orientation Level: Oriented X4 / Intact Behavior During Session: WFL for tasks performed    Extremity/Trunk Assessment Right Upper Extremity Assessment RUE ROM/Strength/Tone: Within functional levels Left Upper Extremity Assessment LUE ROM/Strength/Tone: Within functional levels Right Lower Extremity Assessment RLE ROM/Strength/Tone: Unable to fully assess;Due to pain RLE Sensation: WFL - Light Touch RLE Coordination: WFL - gross motor Left Lower Extremity Assessment LLE ROM/Strength/Tone: Deficits;Unable to fully assess;Due to pain LLE ROM/Strength/Tone Deficits: pt able to move leg against gravity but with extreme pain, unable to fully test because at this point, resisted mvmt causing unbearable pain LLE Sensation: Deficits LLE Sensation Deficits: numbess, tingling down left leg to ankle and 4th and 5th toes LLE Coordination: WFL - gross motor Trunk Assessment Trunk Assessment: Other exceptions Trunk Exceptions: decreased lumbar lordosis   Balance Balance Balance Assessed: No  End  of Session PT - End of Session Activity Tolerance: Patient limited by pain Patient left: in bed;with call bell/phone within reach;with family/visitor present Nurse Communication: Mobility status  GP Functional Assessment Tool Used: clinical judgement Functional Limitation: Mobility: Walking and moving around;Changing and maintaining body position Mobility: Walking and Moving Around Current Status [phone]): At least 40 percent but less than 60 percent impaired, limited or restricted Mobility: Walking and Moving Around Goal Status [phone]): 0 percent impaired, limited or restricted Changing and Maintaining Body Position Current Status (U9811): At least 40 percent but less than 60 percent impaired, limited or restricted Changing and Maintaining Body Position Goal Status (B1478): 0 percent impaired, limited or restricted  Lyanne Co, PT  Acute Rehab Services  [phone]  Lyanne Co 08/08/2011, 3:28 PM

## 2011-08-09 DIAGNOSIS — I10 Essential (primary) hypertension: Secondary | ICD-10-CM | POA: Diagnosis present

## 2011-08-09 HISTORY — DX: Essential (primary) hypertension: I10

## 2011-08-09 LAB — BASIC METABOLIC PANEL
BUN: 13 mg/dL (ref 6–23)
Creatinine, Ser: 0.47 mg/dL — ABNORMAL LOW (ref 0.50–1.10)
GFR calc Af Amer: >90 mL/min (ref >90)
GFR calc non Af Amer: >90 mL/min (ref >90)

## 2011-08-09 LAB — CBC
HCT: 44.2 % (ref 36.0–46.0)
MCHC: 35.3 g/dL (ref 30.0–36.0)
MCV: 84.2 fL (ref 78.0–100.0)
RDW: 12.1 % (ref 11.5–15.5)

## 2011-08-09 MED ORDER — AMLODIPINE BESYLATE 10 MG PO TABS
10.0000 mg | ORAL_TABLET | Freq: Every day | ORAL | Status: DC
  Administered 2011-08-09 – 2011-08-11 (×3): 10 mg via ORAL
  Filled 2011-08-09 (×4): qty 1

## 2011-08-09 MED ORDER — POTASSIUM CHLORIDE CRYS ER 20 MEQ PO TBCR
40.0000 meq | EXTENDED_RELEASE_TABLET | Freq: Every day | ORAL | Status: DC
  Administered 2011-08-09 – 2011-08-11 (×3): 40 meq via ORAL
  Filled 2011-08-09 (×4): qty 2

## 2011-08-09 NOTE — Consult Note (Signed)
Reason for Consult: Back and left leg pain Referring Physician: [facility]  SAMAYRA HEBEL is an 41 y.o. female.  HPI: The patient is a 41 year old female who had a discectomy at L4-5 on the left approximately 4 years ago. She did well after that surgery except for some intermittent back pain. In December of last year she developed some back pain and then it began radiating down the left leg. She was evaluated in [place] and had an MRI scan which showed a disc herniation at L5-S1 on the left. She is to an epidural shots over the next few months and actually got significant improvement. Approximately 2 weeks ago she had marked increase in her pain again got progressively worse in spite of oral steroids pain medication and muscle relaxants. He got so severe that yesterday she came to the emergency room was admitted by the hospitalist service. He got an MRI scan and neurosurgical consultation was requested. At this time the patient complained of severe pain radiating down her left leg to the point where she is unable to ambulate.  Past Medical History  Diagnosis Date  . Herniated disc     No past surgical history on file.  No family history on file.  Social History:  reports that she has never smoked. She does not have any smokeless tobacco history on file. She reports that she does not drink alcohol or use illicit drugs.  Allergies: No Known Allergies  Medications: I have reviewed the patient's current medications.  Results for orders placed during the hospital encounter of 08/07/11 (from the past 48 hour(s))  CBC WITH DIFFERENTIAL     Status: Abnormal   Collection Time   08/07/11  7:17 PM      Component Value Range Comment   WBC 16.2 (*) 4.0 - 10.5 K/uL    RBC 4.98  3.87 - 5.11 MIL/uL    Hemoglobin 14.9  12.0 - 15.0 g/dL    HCT 57.8  46.9 - 62.9 %    MCV 85.1  78.0 - 100.0 fL    MCH 29.9  26.0 - 34.0 pg    MCHC 35.1  30.0 - 36.0 g/dL    RDW 52.8  41.3 - 24.4 %    Platelets  320  150 - 400 K/uL    Neutrophils Relative 68  43 - 77 %    Neutro Abs 11.0 (*) 1.7 - 7.7 K/uL    Lymphocytes Relative 25  12 - 46 %    Lymphs Abs 4.0  0.7 - 4.0 K/uL    Monocytes Relative 7  3 - 12 %    Monocytes Absolute 1.1 (*) 0.1 - 1.0 K/uL    Eosinophils Relative 0  0 - 5 %    Eosinophils Absolute 0.0  0.0 - 0.7 K/uL    Basophils Relative 0  0 - 1 %    Basophils Absolute 0.0  0.0 - 0.1 K/uL   BASIC METABOLIC PANEL     Status: Abnormal   Collection Time   08/07/11  7:17 PM      Component Value Range Comment   Sodium 137  135 - 145 mEq/L    Potassium 3.2 (*) 3.5 - 5.1 mEq/L    Chloride 104  96 - 112 mEq/L    CO2 24  19 - 32 mEq/L    Glucose, Bld 86  70 - 99 mg/dL    BUN 11  6 - 23 mg/dL    Creatinine, Ser 0.10 (*)  0.50 - 1.10 mg/dL    Calcium 8.7  8.4 - 40.9 mg/dL    GFR calc non Af Amer >90  >90 mL/min    GFR calc Af Amer >90  >90 mL/min   URINALYSIS, ROUTINE W REFLEX MICROSCOPIC     Status: Abnormal   Collection Time   08/08/11  6:42 AM      Component Value Range Comment   Color, Urine YELLOW  YELLOW    APPearance CLOUDY (*) CLEAR    Specific Gravity, Urine 1.024  1.005 - 1.030    pH 6.0  5.0 - 8.0    Glucose, UA NEGATIVE  NEGATIVE mg/dL    Hgb urine dipstick TRACE (*) NEGATIVE    Bilirubin Urine NEGATIVE  NEGATIVE    Ketones, ur NEGATIVE  NEGATIVE mg/dL    Protein, ur NEGATIVE  NEGATIVE mg/dL    Urobilinogen, UA 0.2  0.0 - 1.0 mg/dL    Nitrite POSITIVE (*) NEGATIVE    Leukocytes, UA MODERATE (*) NEGATIVE   URINE MICROSCOPIC-ADD ON     Status: Abnormal   Collection Time   08/08/11  6:42 AM      Component Value Range Comment   Squamous Epithelial / LPF FEW (*) RARE    WBC, UA 11-20  <3 WBC/hpf    RBC / HPF 0-2  <3 RBC/hpf    Bacteria, UA MANY (*) RARE   CBC     Status: Abnormal   Collection Time   08/09/11  6:22 AM      Component Value Range Comment   WBC 15.6 (*) 4.0 - 10.5 K/uL    RBC 5.25 (*) 3.87 - 5.11 MIL/uL    Hemoglobin 15.6 (*) 12.0 - 15.0 g/dL    HCT  81.1  91.4 - 78.2 %    MCV 84.2  78.0 - 100.0 fL    MCH 29.7  26.0 - 34.0 pg    MCHC 35.3  30.0 - 36.0 g/dL    RDW 95.6  21.3 - 08.6 %    Platelets 346  150 - 400 K/uL   BASIC METABOLIC PANEL     Status: Abnormal   Collection Time   08/09/11  6:22 AM      Component Value Range Comment   Sodium 136  135 - 145 mEq/L    Potassium 3.3 (*) 3.5 - 5.1 mEq/L    Chloride 99  96 - 112 mEq/L    CO2 24  19 - 32 mEq/L    Glucose, Bld 171 (*) 70 - 99 mg/dL    BUN 13  6 - 23 mg/dL    Creatinine, Ser 5.78 (*) 0.50 - 1.10 mg/dL    Calcium 9.5  8.4 - 46.9 mg/dL    GFR calc non Af Amer >90  >90 mL/min    GFR calc Af Amer >90  >90 mL/min     Mr Lumbar Spine W Wo Contrast  08/09/2011  *RADIOLOGY REPORT*  Clinical Data: Increasing low back pain and left lower extremity radiculopathy.  MRI LUMBAR SPINE WITHOUT AND WITH CONTRAST  Technique:  Multiplanar and multiecho pulse sequences of the lumbar spine were obtained without and with intravenous contrast.  Contrast: 20mL MULTIHANCE GADOBENATE DIMEGLUMINE 529 MG/ML IV SOLN  Comparison: MRI dated 03/11/2011 and radiographs dated 03/06/2011  Findings: Scan extends from T11-12 through S4.  Tip of the conus is at L1-2 with a tiny lipoma of the filum terminalis.  Normal paraspinal soft tissues.  T10-11 through L3-4: Normal.  L4-5:  There is a recurrent disc extrusion into the left lateral recess compressing the left L5 nerve root sleeve.  Evidence of previous left hemilaminotomy.  After contrast administration there is slight enhancement around the thecal sac.  The extrusion is new since 03/11/2011.  L5-S1:  There is a focal central disc protrusion which does not touch the nerve roots or thecal sac.  There is enhancing epidural fibrosis around the left S1 nerve root sleeve.  This does have a slight mass effect upon the left S1 nerve root sleeve.  IMPRESSION:  1.  New disc extrusion into the left lateral recess at L4-5 compressing the left L5 nerve root. 2.  Small central disc  protrusion at L5-S1 without neural impingement. 3.  Enhancing scar tissue around the left S1 nerve root sleeve at L5-S1.  Original Report Authenticated By: Gwynn Burly, M.D.    A comprehensive review of systems was negative. Blood pressure 154/107, pulse 97, temperature 97.9 F (36.6 C), temperature source Oral, resp. rate 20, height 5\' 1"  (1.549 m), weight 107.956 kg (238 lb), SpO2 96.00%. The patient is awake alert and oriented. She is no facial asymmetry. She is unable to ambulate due to severe pain. Her strength however appears to be intact but is a bit difficult to test due to 2 pain.  Assessment/Plan: Her MRI scan is reviewed and this compared to her scan from February. The disc herniation at L5-S1 on the left is not as severe as it was back then. The S1 nerve root is not as compressed as it was. At L4-5 however she is a very large recurrent disc herniation with marked neural compression. We have discussed the options at this time. She's failed aggressive conservative therapy and has severe pain that is greatly limiting her activities of daily living to poor she is even unable to ambulate. She like to proceed with surgery. We discussed the options of a fusion versus a simple discectomy. With this recurrent disc herniation and the fact that she also ruptured and adjacent level disc to this recently I think that a simple discectomy would be very unlikely to give her long-term relief. Therefore, we have elected to proceed with a two-level posterior lumbar interbody fusion with pedicle screw fixation. I had a long discussion with her regarding the risks and benefits of surgical intervention. The risks discussed include but are not limited to bleeding infection weakness numbness paralysis trouble with instrumentation spinal fluid leakage nonunion coma and death. We have discussed alternative methods of therapy offered risks and benefits of nonintervention. She has had the opportunity to rest numerous  questions and appears to understand. With this information in hand she has requested we proceed with surgery and she'll be in scheduled for later this week.  Reinaldo Meeker, MD 08/09/2011, 9:46 AM

## 2011-08-09 NOTE — Progress Notes (Signed)
Physical Therapy Treatment Patient Details Name: Jasmin Gordon MRN: [id number] DOB: [birth year] Today's Date: 08/09/2011 Time: 4098-1191 PT Time Calculation (min): 15 min  PT Assessment / Plan / Recommendation Comments on Treatment Session  Pt's mobility limited by pain.  Tearful with mobility.  Per chart review,  plans are for pt to have back surgery later in week.      Follow Up Recommendations  Home health PT;Outpatient PT;Other (comment) (depending on mgmt of back pain)    Barriers to Discharge        Equipment Recommendations  Other (comment) (TBD)    Recommendations for Other Services    Frequency Min 5X/week   Plan      Precautions / Restrictions Precautions Precautions: Back;Fall Precaution Comments: precautions not formal as pt has not had back surgery but eduacted for safe mobility with back pain Restrictions Weight Bearing Restrictions: No     Pertinent Vitals/Pain 10/10.  Pt tearful.  Repositioned in prone.  Hot pack applied.  Premedicated.     Mobility  Bed Mobility Bed Mobility: Rolling Right;Right Sidelying to Sit;Sit to Sidelying Right Rolling Right: 5: Supervision Right Sidelying to Sit: 5: Supervision;HOB flat Sit to Sidelying Right: 5: Supervision Details for Bed Mobility Assistance: Cues to reinforce technique to decrease stress/discomfort on back.   Transfers Transfers: Sit to Stand;Stand to Sit Sit to Stand: 4: Min guard;With upper extremity assist;From bed Stand to Sit: 4: Min guard;To bed;With upper extremity assist Details for Transfer Assistance: Guarding for safety due to pain.  Cues for safe technique.   Ambulation/Gait Ambulation/Gait Assistance: 4: Min guard Ambulation Distance (Feet): 12 Feet Assistive device: Rolling walker Ambulation/Gait Assistance Details: Utilized RW today to attempt decrease pain in LLE & back.   Pt obviously in excruciating pain as evident in becoming tearful with short distance.  ] Stairs: No Wheelchair  Mobility Wheelchair Mobility: No           PT Goals Acute Rehab PT Goals PT Goal: Supine/Side to Sit - Progress: Not met PT Goal: Sit to Supine/Side - Progress: Not met PT Goal: Sit to Stand - Progress: Progressing toward goal PT Goal: Stand to Sit - Progress: Progressing toward goal PT Goal: Ambulate - Progress: Progressing toward goal  Visit Information  Last PT Received On: 08/09/11 Assistance Needed: +1    Subjective Data  Subjective: "I'll try"   Cognition  Overall Cognitive Status: Appears within functional limits for tasks assessed/performed Arousal/Alertness: Awake/alert Orientation Level: Oriented X4 / Intact Behavior During Session: [facility] for tasks performed    Balance  Balance Balance Assessed: No  End of Session PT - End of Session Activity Tolerance: Patient limited by pain Patient left: in bed;with family/visitor present;with call bell/phone within reach;Other (comment) (prone position) Nurse Communication: Mobility status    Verdell Face, [place] [phone] 08/09/2011

## 2011-08-09 NOTE — Progress Notes (Addendum)
Subjective: Still symptomatic.  Objective: Vital signs in last 24 hours: Temp:  [97.9 F (36.6 C)-98.5 F (36.9 C)] 97.9 F (36.6 C) (07/09 0606) Pulse Rate:  [76-97] 97  (07/09 0606) Resp:  [16-20] 20  (07/09 0606) BP: (145-154)/(94-107) 154/107 mmHg (07/09 0606) SpO2:  [96 %-99 %] 96 % (07/09 0606) Weight change:  Last BM Date: 08/06/11  Intake/Output from previous day: 07/08 0701 - 07/09 0700 In: -  Out: 100 [Urine:100]     Physical Exam: General:  Alert, communicative, fully oriented, not short of breath at rest.  HEENT:  No clinical pallor, no jaundice, no conjunctival injection or discharge. Hydration status is satisfactory. NECK:  Supple, JVP not seen, no carotid bruits, no palpable lymphadenopathy, no palpable goiter. CHEST:  Clinically clear to auscultation, no wheezes, no crackles. HEART:  Sounds 1 and 2 heard, normal, regular, no murmurs. ABDOMEN:  Morbidly obese, soft, non-tender, no palpable organomegaly, no palpable masses, normal bowel sounds. GENITALIA:  Not examined. LOWER EXTREMITIES:  No pitting edema, palpable peripheral pulses. MUSCULOSKELETAL SYSTEM:  Has tenderness lower lumbar/sacral spine, with left paraspinal muscle spasm. Unable to attempt straight-leg raising, secondary to significant pain down back of left leg, on slightest movement. CENTRAL NERVOUS SYSTEM:  No focal neurologic deficit on gross examination.  Lab Results:  Basename 08/09/11 0622 08/07/11 1917  WBC 15.6* 16.2*  HGB 15.6* 14.9  HCT 44.2 42.4  PLT 346 320    Basename 08/09/11 0622 08/07/11 1917  NA 136 137  K 3.3* 3.2*  CL 99 104  CO2 24 24  GLUCOSE 171* 86  BUN 13 11  CREATININE 0.47* 0.45*  CALCIUM 9.5 8.7   No results found for this or any previous visit (from the past 240 hour(s)).   Studies/Results: Mr Lumbar Spine W Wo Contrast  08/09/2011  *RADIOLOGY REPORT*  Clinical Data: Increasing low back pain and left lower extremity radiculopathy.  MRI LUMBAR SPINE  WITHOUT AND WITH CONTRAST  Technique:  Multiplanar and multiecho pulse sequences of the lumbar spine were obtained without and with intravenous contrast.  Contrast: 20mL MULTIHANCE GADOBENATE DIMEGLUMINE 529 MG/ML IV SOLN  Comparison: MRI dated 03/11/2011 and radiographs dated 03/06/2011  Findings: Scan extends from T11-12 through S4.  Tip of the conus is at L1-2 with a tiny lipoma of the filum terminalis.  Normal paraspinal soft tissues.  T10-11 through L3-4: Normal.  L4-5:  There is a recurrent disc extrusion into the left lateral recess compressing the left L5 nerve root sleeve.  Evidence of previous left hemilaminotomy.  After contrast administration there is slight enhancement around the thecal sac.  The extrusion is new since 03/11/2011.  L5-S1:  There is a focal central disc protrusion which does not touch the nerve roots or thecal sac.  There is enhancing epidural fibrosis around the left S1 nerve root sleeve.  This does have a slight mass effect upon the left S1 nerve root sleeve.  IMPRESSION:  1.  New disc extrusion into the left lateral recess at L4-5 compressing the left L5 nerve root. 2.  Small central disc protrusion at L5-S1 without neural impingement. 3.  Enhancing scar tissue around the left S1 nerve root sleeve at L5-S1.  Original Report Authenticated By: Gwynn Burly, M.D.    Medications: Scheduled Meds:    . ciprofloxacin  500 mg Oral BID  . cyclobenzaprine  5 mg Oral TID  . docusate sodium  100 mg Oral BID  . fentaNYL  25 mcg Transdermal Q72H  . heparin  5,000 Units Subcutaneous Q8H  . ibuprofen  600 mg Oral TID WC  . pantoprazole  40 mg Oral Q1200  . predniSONE  20 mg Oral BID  . senna  1 tablet Oral BID   Continuous Infusions:  PRN Meds:.gadobenate dimeglumine, HYDROmorphone, HYDROmorphone (DILAUDID) injection, ketorolac, ondansetron (ZOFRAN) IV, ondansetron (ZOFRAN) IV, ondansetron, DISCONTD: cyclobenzaprine  Assessment/Plan:  Active Problems: 1. Intractable low  back pain/Left radiculopathy:  Patient has history of disc desiccation in L4-L5, L5-S1, status post left laminectomy in L4. Since the beginning of this year, she has had recurrent lower back pain, and is now status post status 3 Depo shots. Oral prednisone was started on 08/03/11. Symptoms have steadily progressed, and she is now unable to ambulate. Clinically, she has a radiculopathy. Managing with analgesics, and muscle relaxants. MRI showed new disc extrusion into the left lateral recess at L4-5 compressing the left L5 nerve root, small central disc protrusion at L5-S1 without neural  Impingement, enhancing scar tissue around the left S1 nerve root sleeve at L5-S1. Dr Gerlene Fee provided neurosurgical consultation, and surgery is planned. Have stopped NSAIDS and Heparin. SCD ordered.  2. UTI: Patient has a positive urinary sediment, with pyuria and bacteriuria. Now on oral Ciprofloxacin, day#2. Urine culture is pending.  3. HTN: BP is significantly elevated today. Patient has no previous history of hypertension. Will commence Norvasc.    LOS: 2 days   Jasmin Gordon,Jasmin Gordon 08/09/2011, 9:39 AM

## 2011-08-10 DIAGNOSIS — IMO0002 Reserved for concepts with insufficient information to code with codable children: Secondary | ICD-10-CM

## 2011-08-10 LAB — URINE CULTURE

## 2011-08-10 LAB — BASIC METABOLIC PANEL
CO2: 24 mEq/L (ref 19–32)
Chloride: 99 mEq/L (ref 96–112)
GFR calc non Af Amer: >90 mL/min (ref >90)
Glucose, Bld: 149 mg/dL — ABNORMAL HIGH (ref 70–99)
Potassium: 4.4 mEq/L (ref 3.5–5.1)
Sodium: 138 mEq/L (ref 135–145)

## 2011-08-10 LAB — CBC
Hemoglobin: 16.7 g/dL — ABNORMAL HIGH (ref 12.0–15.0)
MCHC: 34.7 g/dL (ref 30.0–36.0)
RBC: 5.66 MIL/uL — ABNORMAL HIGH (ref 3.87–5.11)
WBC: 14.3 10*3/uL — ABNORMAL HIGH (ref 4.0–10.5)

## 2011-08-10 MED ORDER — ENOXAPARIN SODIUM 60 MG/0.6ML ~~LOC~~ SOLN
50.0000 mg | Freq: Once | SUBCUTANEOUS | Status: AC
  Administered 2011-08-10: 50 mg via SUBCUTANEOUS
  Filled 2011-08-10: qty 0.6

## 2011-08-10 MED ORDER — METOPROLOL TARTRATE 12.5 MG HALF TABLET
12.5000 mg | ORAL_TABLET | Freq: Two times a day (BID) | ORAL | Status: DC
  Administered 2011-08-11 (×2): 12.5 mg via ORAL
  Filled 2011-08-10 (×5): qty 1

## 2011-08-10 NOTE — Progress Notes (Signed)
ANTICOAGULATION CONSULT NOTE - Initial Consult  Pharmacy Consult for Lovenox Indication: VTE prophylaxis  No Known Allergies  Patient Measurements: Height: 5\' 1"  (154.9 cm) Weight: 238 lb (107.956 kg) IBW/kg (Calculated) : 47.8  BMI 45  Vital Signs: Temp: 98.8 F (37.1 C) (07/10 1418) Temp src: Oral (07/10 0634) BP: 141/102 mmHg (07/10 1418) Pulse Rate: 113  (07/10 1418)  Labs:  Basename 08/10/11 0612 08/09/11 0622 08/07/11 1917  HGB 16.7* 15.6* --  HCT 48.1* 44.2 42.4  PLT 367 346 320  APTT -- -- --  LABPROT -- -- --  INR -- -- --  HEPARINUNFRC -- -- --  CREATININE 0.45* 0.47* 0.45*  CKTOTAL -- -- --  CKMB -- -- --  TROPONINI -- -- --    Estimated Creatinine Clearance: 107.2 ml/min (by C-G formula based on Cr of 0.45).   Medical History: Past Medical History  Diagnosis Date  . Herniated disc     Assessment: 41 yo F admitted with intractable low back pain/left radiculopathy for spinal surgery tomorrow afternoon.  To receive DVT prophylaxis with Lovenox until surgery.  BMI 45 (wt 108kg) and est crcl >100 ml/min.  Goal of Therapy:  Anti-Xa level 4-hour after Lovenox given 0.3-0.6 units/ml Monitor platelets by anticoagulation protocol: Yes   Plan:  1.  Lovenox 50 mg SQ x 1 dose 2.  F/up after surgery for further DVT prophylaxis  Rolland Porter, Pharm.D., BCPS Clinical Pharmacist Pager: [phone] 08/10/2011,4:16 PM

## 2011-08-10 NOTE — Progress Notes (Signed)
Subjective: Pain is controlled.  Objective: Vital signs in last 24 hours: Temp:  [98.1 F (36.7 C)-98.5 F (36.9 C)] 98.1 F (36.7 C) (07/10 0634) Pulse Rate:  [100-117] 100  (07/10 0634) Resp:  [18-20] 18  (07/10 0634) BP: (133-176)/(101-107) 133/107 mmHg (07/10 0634) SpO2:  [98 %-100 %] 98 % (07/10 0634) Weight change:  Last BM Date: 08/06/11  Intake/Output from previous day: 07/09 0701 - 07/10 0700 In: 720 [P.O.:720] Out: -      Physical Exam: General:  Alert, communicative, fully oriented, not short of breath at rest.  HEENT:  No clinical pallor, no jaundice, no conjunctival injection or discharge. Hydration status is satisfactory. NECK:  Supple, JVP not seen, no carotid bruits, no palpable lymphadenopathy, no palpable goiter. CHEST:  Clinically clear to auscultation, no wheezes, no crackles. HEART:  Sounds 1 and 2 heard, normal, regular, no murmurs. ABDOMEN:  Morbidly obese, soft, non-tender, no palpable organomegaly, no palpable masses, normal bowel sounds. GENITALIA:  Not examined. LOWER EXTREMITIES:  No pitting edema, palpable peripheral pulses. MUSCULOSKELETAL SYSTEM:  Has tenderness lower lumbar/sacral spine, with left paraspinal muscle spasm. Unable to attempt straight-leg raising, secondary to significant pain down back of left leg, on slightest movement. CENTRAL NERVOUS SYSTEM:  No focal neurologic deficit on gross examination.  Lab Results:  [facility] 08/10/11 0612 08/09/11 0622  WBC 14.3* 15.6*  HGB 16.7* 15.6*  HCT 48.1* 44.2  PLT 367 346    Basename 08/10/11 0612 08/09/11 0622  NA 138 136  K 4.4 3.3*  CL 99 99  CO2 24 24  GLUCOSE 149* 171*  BUN 14 13  CREATININE 0.45* 0.47*  CALCIUM 9.8 9.5   Recent Results (from the past 240 hour(s))  URINE CULTURE     Status: Normal   Collection Time   08/09/11  5:30 AM      Component Value Range Status Comment   Specimen Description URINE, CLEAN CATCH   Final    Special Requests NONE   Final    Culture   Setup Time 08/09/2011 06:13   Final    Colony Count 20,OOO COLONIES/ML   Final    Culture     Final    Value: Multiple bacterial morphotypes present, none predominant. Suggest appropriate recollection if clinically indicated.   Report Status 08/10/2011 FINAL   Final      Studies/Results: Mr Lumbar Spine W Wo Contrast  08/09/2011  *RADIOLOGY REPORT*  Clinical Data: Increasing low back pain and left lower extremity radiculopathy.  MRI LUMBAR SPINE WITHOUT AND WITH CONTRAST  Technique:  Multiplanar and multiecho pulse sequences of the lumbar spine were obtained without and with intravenous contrast.  Contrast: 20mL MULTIHANCE GADOBENATE DIMEGLUMINE 529 MG/ML IV SOLN  Comparison: MRI dated 03/11/2011 and radiographs dated 03/06/2011  Findings: Scan extends from T11-12 through S4.  Tip of the conus is at L1-2 with a tiny lipoma of the filum terminalis.  Normal paraspinal soft tissues.  T10-11 through L3-4: Normal.  L4-5:  There is a recurrent disc extrusion into the left lateral recess compressing the left L5 nerve root sleeve.  Evidence of previous left hemilaminotomy.  After contrast administration there is slight enhancement around the thecal sac.  The extrusion is new since 03/11/2011.  L5-S1:  There is a focal central disc protrusion which does not touch the nerve roots or thecal sac.  There is enhancing epidural fibrosis around the left S1 nerve root sleeve.  This does have a slight mass effect upon the left S1 nerve  root sleeve.  IMPRESSION:  1.  New disc extrusion into the left lateral recess at L4-5 compressing the left L5 nerve root. 2.  Small central disc protrusion at L5-S1 without neural impingement. 3.  Enhancing scar tissue around the left S1 nerve root sleeve at L5-S1.  Original Report Authenticated By: Gwynn Burly, M.D.    Medications: Scheduled Meds:    . amLODipine  10 mg Oral Daily  . ciprofloxacin  500 mg Oral BID  . cyclobenzaprine  5 mg Oral TID  . docusate sodium  100 mg  Oral BID  . fentaNYL  25 mcg Transdermal Q72H  . pantoprazole  40 mg Oral Q1200  . potassium chloride  40 mEq Oral Daily  . predniSONE  20 mg Oral BID  . senna  1 tablet Oral BID   Continuous Infusions:  PRN Meds:.HYDROmorphone, ketorolac, ondansetron (ZOFRAN) IV, ondansetron  Assessment/Plan:  Active Problems: 1. Intractable low back pain/Left radiculopathy:  Patient has history of disc desiccation in L4-L5, L5-S1, status post left laminectomy in L4. Since the beginning of this year, she has had recurrent lower back pain, and is now status post status 3 Depo shots. Oral prednisone was started on 08/03/11. Symptoms have steadily progressed, and she is now unable to ambulate. Clinically, she has a radiculopathy. Managing with analgesics, and muscle relaxants. MRI showed new disc extrusion into the left lateral recess at L4-5 compressing the left L5 nerve root, small central disc protrusion at L5-S1 without neural  Impingement, enhancing scar tissue around the left S1 nerve root sleeve at L5-S1. Dr Gerlene Fee provided neurosurgical consultation, and surgery is planned for tomorrow afternoon. Have stopped NSAIDS and Heparin. SCD ordered.  2. UTI: Patient has a positive urinary sediment, with pyuria and bacteriuria. Now on oral Ciprofloxacin, day#3. Urine culture shows 20,000 colonies with multiple bacterial morphotypes. We will complete the course of ciprofloxacin.  3. HTN: BP is better controlled today. Patient has no previous history of hypertension. Will continue with Norvasc and add low dose B blocker.  4. Tachycardia; will get a 12 lead EKG. Tachycardia, most likely from pain. Will continue to monitor.  5. Leukocytosis: probably from steroid injections. Improving. Continue to  Monitor.  6. DVT prophylaxis: lovenox.     LOS: 3 days   Chatham Howington 08/10/2011, 2:17 PM

## 2011-08-10 NOTE — Progress Notes (Addendum)
Physical Therapy Treatment Patient Details Name: Jasmin Gordon MRN: [id number] DOB: May 07, 1970 Today's Date: 08/10/2011 Time: 1037 (1037)-1050 PT Time Calculation (min): 13 min  PT Assessment / Plan / Recommendation Comments on Treatment Session  Upon arrival pt reports pain better controlled today & that she was able to ambulate to bathroom without severe pain, however while ambulating at this time pt's pain significantly increased.  Attempted to position pt in chair but unable to tolerate due to pain.  assisted pt back to bed.  Per MD note, pt scheduled for sx tomorrow afternoon.      Follow Up Recommendations  Home health PT;Outpatient PT;Other (comment) (depending on mgmt of back pain)    Barriers to Discharge        Equipment Recommendations  Other (comment) (TBD)    Recommendations for Other Services    Frequency Min 5X/week   Plan      Precautions / Restrictions Precautions Precautions: Back;Fall Precaution Comments: precautions not formal as pt has not had back surgery but eduacted for safe mobility with back pain Restrictions Weight Bearing Restrictions: No     Pertinent Vitals/Pain 10/10 with OOB activity.  RN made aware.  Repositioned.      Mobility  Bed Mobility Bed Mobility: Rolling Right;Right Sidelying to Sit;Sit to Sidelying Right Rolling Right: 6: Modified independent (Device/Increase time) Right Sidelying to Sit: 5: Supervision;HOB flat Sit to Sidelying Right: 5: Supervision;HOB flat Details for Bed Mobility Assistance: No physical assist needed.  Cues implement log rolling & back precautions to decrease discomfort.  Cues needed mostly for "no twisting" Transfers Transfers: Sit to Stand;Stand to Sit Sit to Stand: 5: Supervision;With upper extremity assist;From bed Stand to Sit: 5: Supervision;With upper extremity assist;With armrests;To chair/3-in-1;To bed Details for Transfer Assistance: cues for safety & hand placement.  As pain increases pt with  decreased safe technique- attempts to sit before ensure safe body positioning.   Ambulation/Gait Ambulation/Gait Assistance: 4: Min guard Ambulation Distance (Feet): 20 Feet Assistive device: Rolling walker Ambulation/Gait Assistance Details: cues for safe RW management, tall posture.  Cont to require guarding due to pain.   Gait Pattern: Step-through pattern;Decreased stride length;Trunk flexed General Gait Details: Increased pain in low back & left leg.   Stairs: No Wheelchair Mobility Wheelchair Mobility: No      PT Goals Acute Rehab PT Goals Time For Goal Achievement: 08/15/11 Potential to Achieve Goals: Good PT Goal: Supine/Side to Sit - Progress: Progressing toward goal PT Goal: Sit to Supine/Side - Progress: Progressing toward goal PT Goal: Sit to Stand - Progress: Progressing toward goal PT Goal: Stand to Sit - Progress: Progressing toward goal PT Goal: Ambulate - Progress: Progressing toward goal  Visit Information  Last PT Received On: 08/10/11 Assistance Needed: +1    Subjective Data  Subjective: "It's feeling better than yesterday"   Cognition  Overall Cognitive Status: Appears within functional limits for tasks assessed/performed Arousal/Alertness: Awake/alert Orientation Level: Oriented X4 / Intact Behavior During Session: [facility] for tasks performed    Balance  Balance Balance Assessed: No  End of Session PT - End of Session Activity Tolerance: Patient limited by pain Patient left: in bed;with call bell/phone within reach;with family/visitor present    Verdell Face, PTA [phone] 08/10/2011

## 2011-08-10 NOTE — Progress Notes (Signed)
Patient ID: Jasmin Gordon, female   DOB: 30-Sep-1970, 41 y.o.   MRN: [id number] Subjective: Patient reports feeling a little better  Objective: Vital signs in last 24 hours: Temp:  [98.1 F (36.7 C)-99 F (37.2 C)] 98.1 F (36.7 C) (07/10 0634) Pulse Rate:  [90-117] 100  (07/10 0634) Resp:  [18-20] 18  (07/10 0634) BP: (133-176)/(98-107) 133/107 mmHg (07/10 0634) SpO2:  [98 %-100 %] 98 % (07/10 0634)  Intake/Output from previous day: 07/09 0701 - 07/10 0700 In: 720 [P.O.:720] Out: -  Intake/Output this shift:    no new changes  Lab Results:  [facility] 08/10/11 0612 08/09/11 0622  WBC 14.3* 15.6*  HGB 16.7* 15.6*  HCT 48.1* 44.2  PLT 367 346   BMET  Basename 08/10/11 0612 08/09/11 0622  NA 138 136  K 4.4 3.3*  CL 99 99  CO2 24 24  GLUCOSE 149* 171*  BUN 14 13  CREATININE 0.45* 0.47*  CALCIUM 9.8 9.5    Studies/Results: Mr Lumbar Spine W Wo Contrast  08/09/2011  *RADIOLOGY REPORT*  Clinical Data: Increasing low back pain and left lower extremity radiculopathy.  MRI LUMBAR SPINE WITHOUT AND WITH CONTRAST  Technique:  Multiplanar and multiecho pulse sequences of the lumbar spine were obtained without and with intravenous contrast.  Contrast: 20mL MULTIHANCE GADOBENATE DIMEGLUMINE 529 MG/ML IV SOLN  Comparison: MRI dated 03/11/2011 and radiographs dated 03/06/2011  Findings: Scan extends from T11-12 through S4.  Tip of the conus is at L1-2 with a tiny lipoma of the filum terminalis.  Normal paraspinal soft tissues.  T10-11 through L3-4: Normal.  L4-5:  There is a recurrent disc extrusion into the left lateral recess compressing the left L5 nerve root sleeve.  Evidence of previous left hemilaminotomy.  After contrast administration there is slight enhancement around the thecal sac.  The extrusion is new since 03/11/2011.  L5-S1:  There is a focal central disc protrusion which does not touch the nerve roots or thecal sac.  There is enhancing epidural fibrosis around the left S1  nerve root sleeve.  This does have a slight mass effect upon the left S1 nerve root sleeve.  IMPRESSION:  1.  New disc extrusion into the left lateral recess at L4-5 compressing the left L5 nerve root. 2.  Small central disc protrusion at L5-S1 without neural impingement. 3.  Enhancing scar tissue around the left S1 nerve root sleeve at L5-S1.  Original Report Authenticated By: Gwynn Burly, M.D.    Assessment/Plan: For surgery tomorrow afternoon. Risks/benefits discussed. Questions answered. Appears to understand. Requests that we proceed.  LOS: 3 days  As above   Reinaldo Meeker, MD 08/10/2011, 9:14 AM

## 2011-08-11 ENCOUNTER — Inpatient Hospital Stay (HOSPITAL_COMMUNITY): Payer: 59

## 2011-08-11 ENCOUNTER — Encounter (HOSPITAL_COMMUNITY)
Admission: EM | Disposition: A | Discharge status: Home / Self Care | Payer: Self-pay | Source: Home / Self Care | Attending: Internal Medicine

## 2011-08-11 ENCOUNTER — Inpatient Hospital Stay (HOSPITAL_COMMUNITY): Payer: 59 | Admitting: Certified Registered Nurse Anesthetist

## 2011-08-11 ENCOUNTER — Encounter (HOSPITAL_COMMUNITY): Payer: Self-pay | Admitting: Certified Registered Nurse Anesthetist

## 2011-08-11 LAB — SURGICAL PCR SCREEN: Staphylococcus aureus: POSITIVE — AB

## 2011-08-11 SURGERY — POSTERIOR LUMBAR FUSION 2 LEVEL
Anesthesia: General | Site: Back | Wound class: Clean

## 2011-08-11 MED ORDER — PHENOL 1.4 % MT LIQD: 1.0000 | OROMUCOSAL | Status: DC | PRN

## 2011-08-11 MED ORDER — SODIUM CHLORIDE 0.9 % IJ SOLN
3.0000 mL | Freq: Two times a day (BID) | INTRAMUSCULAR | Status: DC
  Administered 2011-08-12 – 2011-08-14 (×4): 3 mL via INTRAVENOUS

## 2011-08-11 MED ORDER — FENTANYL CITRATE 0.05 MG/ML IJ SOLN
INTRAMUSCULAR | Status: DC | PRN
  Administered 2011-08-11 (×4): 50 ug via INTRAVENOUS
  Administered 2011-08-11: 100 ug via INTRAVENOUS
  Administered 2011-08-11: 50 ug via INTRAVENOUS
  Administered 2011-08-11: 100 ug via INTRAVENOUS
  Administered 2011-08-11 (×2): 50 ug via INTRAVENOUS
  Administered 2011-08-11: 100 ug via INTRAVENOUS
  Administered 2011-08-11 (×2): 50 ug via INTRAVENOUS

## 2011-08-11 MED ORDER — CYCLOBENZAPRINE HCL 10 MG PO TABS
10.0000 mg | ORAL_TABLET | Freq: Three times a day (TID) | ORAL | Status: DC | PRN
  Administered 2011-08-12 – 2011-08-15 (×7): 10 mg via ORAL
  Filled 2011-08-11 (×7): qty 1

## 2011-08-11 MED ORDER — LIDOCAINE HCL 1 % IJ SOLN
INTRAMUSCULAR | Status: DC | PRN
Start: 2011-08-11 — End: 2011-08-11
  Administered 2011-08-11: 100 mg via INTRADERMAL

## 2011-08-11 MED ORDER — ZOLPIDEM TARTRATE 5 MG PO TABS: 5.0000 mg | ORAL_TABLET | Freq: Every evening | ORAL | Status: DC | PRN

## 2011-08-11 MED ORDER — MENTHOL 3 MG MT LOZG: 1.0000 | LOZENGE | OROMUCOSAL | Status: DC | PRN

## 2011-08-11 MED ORDER — PROPOFOL 10 MG/ML IV EMUL
INTRAVENOUS | Status: DC | PRN
  Administered 2011-08-11: 200 mg via INTRAVENOUS
  Administered 2011-08-11: 20 mg via INTRAVENOUS

## 2011-08-11 MED ORDER — SODIUM CHLORIDE 0.9 % IV SOLN: 250.0000 mL | INTRAVENOUS | Status: DC

## 2011-08-11 MED ORDER — HYDROMORPHONE HCL PF 1 MG/ML IJ SOLN
1.0000 mg | INTRAMUSCULAR | Status: DC | PRN
  Administered 2011-08-12 – 2011-08-13 (×4): 1 mg via INTRAMUSCULAR
  Filled 2011-08-11 (×4): qty 1

## 2011-08-11 MED ORDER — PHENYLEPHRINE HCL 10 MG/ML IJ SOLN
INTRAMUSCULAR | Status: DC | PRN
  Administered 2011-08-11 (×2): 40 ug via INTRAVENOUS

## 2011-08-11 MED ORDER — GLYCOPYRROLATE 0.2 MG/ML IJ SOLN
INTRAMUSCULAR | Status: DC | PRN
  Administered 2011-08-11: 0.6 mg via INTRAVENOUS

## 2011-08-11 MED ORDER — CEFAZOLIN SODIUM 1-5 GM-% IV SOLN
INTRAVENOUS | Status: DC | PRN
  Administered 2011-08-11: 2 g via INTRAVENOUS

## 2011-08-11 MED ORDER — ACETAMINOPHEN 650 MG RE SUPP: 650.0000 mg | RECTAL | Status: DC | PRN

## 2011-08-11 MED ORDER — ACETAMINOPHEN 325 MG PO TABS: 650.0000 mg | ORAL_TABLET | ORAL | Status: DC | PRN

## 2011-08-11 MED ORDER — 0.9 % SODIUM CHLORIDE (POUR BTL) OPTIME
TOPICAL | Status: DC | PRN
  Administered 2011-08-11: 1000 mL

## 2011-08-11 MED ORDER — ONDANSETRON HCL 4 MG/2ML IJ SOLN: 4.0000 mg | INTRAMUSCULAR | Status: DC | PRN

## 2011-08-11 MED ORDER — CEFAZOLIN SODIUM 1-5 GM-% IV SOLN
1.0000 g | Freq: Three times a day (TID) | INTRAVENOUS | Status: AC
  Administered 2011-08-11 – 2011-08-12 (×2): 1 g via INTRAVENOUS
  Filled 2011-08-11 (×2): qty 50

## 2011-08-11 MED ORDER — BUPIVACAINE HCL (PF) 0.5 % IJ SOLN
INTRAMUSCULAR | Status: DC | PRN
  Administered 2011-08-11: 20 mL

## 2011-08-11 MED ORDER — LIDOCAINE HCL 4 % MT SOLN
OROMUCOSAL | Status: DC | PRN
  Administered 2011-08-11: 4 mL via TOPICAL

## 2011-08-11 MED ORDER — ONDANSETRON HCL 4 MG/2ML IJ SOLN
INTRAMUSCULAR | Status: DC | PRN
  Administered 2011-08-11: 4 mg via INTRAVENOUS

## 2011-08-11 MED ORDER — ROCURONIUM BROMIDE 100 MG/10ML IV SOLN
INTRAVENOUS | Status: DC | PRN
  Administered 2011-08-11: 50 mg via INTRAVENOUS

## 2011-08-11 MED ORDER — MIDAZOLAM HCL 5 MG/5ML IJ SOLN
INTRAMUSCULAR | Status: DC | PRN
  Administered 2011-08-11: 2 mg via INTRAVENOUS

## 2011-08-11 MED ORDER — ARTIFICIAL TEARS OP OINT
TOPICAL_OINTMENT | OPHTHALMIC | Status: DC | PRN
  Administered 2011-08-11: 1 via OPHTHALMIC

## 2011-08-11 MED ORDER — DEXAMETHASONE SODIUM PHOSPHATE 4 MG/ML IJ SOLN
4.0000 mg | Freq: Four times a day (QID) | INTRAMUSCULAR | Status: AC
  Administered 2011-08-11: 4 mg via INTRAVENOUS
  Filled 2011-08-11 (×2): qty 1

## 2011-08-11 MED ORDER — LACTATED RINGERS IV SOLN
INTRAVENOUS | Status: DC | PRN
  Administered 2011-08-11 (×2): via INTRAVENOUS

## 2011-08-11 MED ORDER — THROMBIN 20000 UNITS EX KIT
PACK | CUTANEOUS | Status: DC | PRN
  Administered 2011-08-11 (×2): via TOPICAL

## 2011-08-11 MED ORDER — KCL IN DEXTROSE-NACL 20-5-0.45 MEQ/L-%-% IV SOLN
80.0000 mL/h | INTRAVENOUS | Status: DC
  Filled 2011-08-11 (×4): qty 1000

## 2011-08-11 MED ORDER — HYDROMORPHONE HCL PF 1 MG/ML IJ SOLN
0.2500 mg | INTRAMUSCULAR | Status: DC | PRN
  Administered 2011-08-11 (×4): 0.5 mg via INTRAVENOUS

## 2011-08-11 MED ORDER — NEOSTIGMINE METHYLSULFATE 1 MG/ML IJ SOLN
INTRAMUSCULAR | Status: DC | PRN
  Administered 2011-08-11: 5 mg via INTRAVENOUS

## 2011-08-11 MED ORDER — VECURONIUM BROMIDE 10 MG IV SOLR
INTRAVENOUS | Status: DC | PRN
  Administered 2011-08-11: 2 mg via INTRAVENOUS
  Administered 2011-08-11 (×3): 1 mg via INTRAVENOUS
  Administered 2011-08-11: 3 mg via INTRAVENOUS
  Administered 2011-08-11: 2 mg via INTRAVENOUS
  Administered 2011-08-11: 1 mg via INTRAVENOUS

## 2011-08-11 MED ORDER — HETASTARCH-ELECTROLYTES 6 % IV SOLN
INTRAVENOUS | Status: DC | PRN
  Administered 2011-08-11: 17:00:00 via INTRAVENOUS

## 2011-08-11 MED ORDER — SODIUM CHLORIDE 0.9 % IJ SOLN: 3.0000 mL | INTRAMUSCULAR | Status: DC | PRN

## 2011-08-11 MED ORDER — SODIUM CHLORIDE 0.9 % IR SOLN
Status: DC | PRN
  Administered 2011-08-11: 16:00:00

## 2011-08-11 MED ORDER — HYDROMORPHONE HCL PF 1 MG/ML IJ SOLN
INTRAMUSCULAR | Status: AC
  Administered 2011-08-12: 1 mg via INTRAMUSCULAR
  Filled 2011-08-11: qty 1

## 2011-08-11 MED ORDER — OXYCODONE-ACETAMINOPHEN 5-325 MG PO TABS
1.0000 | ORAL_TABLET | ORAL | Status: DC | PRN
  Administered 2011-08-12 – 2011-08-15 (×14): 2 via ORAL
  Filled 2011-08-11 (×15): qty 2

## 2011-08-11 MED ORDER — ONDANSETRON HCL 4 MG/2ML IJ SOLN: 4.0000 mg | Freq: Once | INTRAMUSCULAR | Status: AC | PRN

## 2011-08-11 MED ORDER — DEXAMETHASONE 4 MG PO TABS
4.0000 mg | ORAL_TABLET | Freq: Four times a day (QID) | ORAL | Status: AC
  Filled 2011-08-11 (×2): qty 1

## 2011-08-11 SURGICAL SUPPLY — 69 items
ADH SKN CLS APL DERMABOND .7 (GAUZE/BANDAGES/DRESSINGS) ×1
ALIGRAFT PLIF PUROS GRAFT (Graft) ×1 IMPLANT
APL SKNCLS STERI-STRIP NONHPOA (GAUZE/BANDAGES/DRESSINGS) ×1
BAG DECANTER FOR FLEXI CONT (MISCELLANEOUS) ×2 IMPLANT
BENZOIN TINCTURE PRP APPL 2/3 (GAUZE/BANDAGES/DRESSINGS) ×3 IMPLANT
BLADE SURG ROTATE 9660 (MISCELLANEOUS) ×2 IMPLANT
BONE EQUIVA 10CC (Bone Implant) ×2 IMPLANT
BRUSH SCRUB EZ PLAIN DRY (MISCELLANEOUS) ×2 IMPLANT
CANISTER SUCTION 2500CC (MISCELLANEOUS) ×2 IMPLANT
CLOTH BEACON ORANGE TIMEOUT ST (SAFETY) ×2 IMPLANT
CONT SPEC 4OZ CLIKSEAL STRL BL (MISCELLANEOUS) ×4 IMPLANT
COVER BACK TABLE 24X17X13 BIG (DRAPES) IMPLANT
COVER TABLE BACK 60X90 (DRAPES) ×2 IMPLANT
DERMABOND ADVANCED (GAUZE/BANDAGES/DRESSINGS) ×1
DERMABOND ADVANCED .7 DNX12 (GAUZE/BANDAGES/DRESSINGS) ×1 IMPLANT
DRAPE C-ARM 42X72 X-RAY (DRAPES) ×4 IMPLANT
DRAPE LAPAROTOMY 100X72X124 (DRAPES) ×2 IMPLANT
DRAPE SURG 17X23 STRL (DRAPES) ×4 IMPLANT
DRESSING TELFA 8X3 (GAUZE/BANDAGES/DRESSINGS) ×2 IMPLANT
ELECT REM PT RETURN 9FT ADLT (ELECTROSURGICAL) ×2
ELECTRODE REM PT RTRN 9FT ADLT (ELECTROSURGICAL) ×1 IMPLANT
EVACUATOR 1/8 PVC DRAIN (DRAIN) ×2 IMPLANT
GAUZE SPONGE 4X4 16PLY XRAY LF (GAUZE/BANDAGES/DRESSINGS) ×1 IMPLANT
GLOVE BIO SURGEON STRL SZ 6.5 (GLOVE) ×2 IMPLANT
GLOVE BIOGEL M 8.0 STRL (GLOVE) ×1 IMPLANT
GLOVE BIOGEL PI IND STRL 6.5 (GLOVE) IMPLANT
GLOVE BIOGEL PI IND STRL 7.0 (GLOVE) IMPLANT
GLOVE BIOGEL PI INDICATOR 6.5 (GLOVE) ×1
GLOVE BIOGEL PI INDICATOR 7.0 (GLOVE) ×2
GLOVE ECLIPSE 7.5 STRL STRAW (GLOVE) ×4 IMPLANT
GLOVE EXAM NITRILE MD LF STRL (GLOVE) ×2 IMPLANT
GLOVE SS BIOGEL STRL SZ 6.5 (GLOVE) IMPLANT
GLOVE SUPERSENSE BIOGEL SZ 6.5 (GLOVE) ×2
GOWN BRE IMP SLV AUR LG STRL (GOWN DISPOSABLE) ×2 IMPLANT
GOWN BRE IMP SLV AUR XL STRL (GOWN DISPOSABLE) ×5 IMPLANT
GOWN STRL REIN 2XL LVL4 (GOWN DISPOSABLE) IMPLANT
GRAFT PLIF 10X9MM (Graft) ×1 IMPLANT
KIT BASIN OR (CUSTOM PROCEDURE TRAY) ×2 IMPLANT
KIT ROOM TURNOVER OR (KITS) ×2 IMPLANT
LUCENT STRAIGHT 10X22X10 (Set) ×1 IMPLANT
LUCENT STRAIGHT 10X22X8 (Set) ×1 IMPLANT
NEEDLE HYPO 22GX1.5 SAFETY (NEEDLE) ×2 IMPLANT
NS IRRIG 1000ML POUR BTL (IV SOLUTION) ×2 IMPLANT
PACK LAMINECTOMY NEURO (CUSTOM PROCEDURE TRAY) ×2 IMPLANT
PAD ARMBOARD 7.5X6 YLW CONV (MISCELLANEOUS) ×6 IMPLANT
PATTIES SURGICAL .75X.75 (GAUZE/BANDAGES/DRESSINGS) ×1 IMPLANT
ROD PREBENT 75MM (Rod) ×2 IMPLANT
SCREW POLY 6.5X40MM (Screw) ×4 IMPLANT
SCREW POLY 6.5X45 (Screw) IMPLANT
SCREW POLY 6.5X45MM (Screw) ×2 IMPLANT
SCREW SEQUOIA 6.5X35MM (Screw) ×1 IMPLANT
SPEEDLINK 11 MEDIUM (Rod) ×1 IMPLANT
SPONGE GAUZE 4X4 12PLY (GAUZE/BANDAGES/DRESSINGS) ×2 IMPLANT
SPONGE LAP 4X18 X RAY DECT (DISPOSABLE) IMPLANT
SPONGE SURGIFOAM ABS GEL 100 (HEMOSTASIS) ×3 IMPLANT
STRIP CLOSURE SKIN 1/2X4 (GAUZE/BANDAGES/DRESSINGS) ×2 IMPLANT
SUT VIC AB 0 CT1 18XCR BRD8 (SUTURE) ×1 IMPLANT
SUT VIC AB 0 CT1 8-18 (SUTURE) ×2
SUT VIC AB 2-0 OS6 18 (SUTURE) ×6 IMPLANT
SUT VIC AB 3-0 CP2 18 (SUTURE) ×2 IMPLANT
SYR 20ML ECCENTRIC (SYRINGE) ×2 IMPLANT
TOOL FLUTED BALL 7MM (MISCELLANEOUS) ×3 IMPLANT
TOOL MATCHSTK 3MM (MISCELLANEOUS) ×2 IMPLANT
TOP CLSR SEQUOIA (Orthopedic Implant) ×6 IMPLANT
TOWEL OR 17X24 6PK STRL BLUE (TOWEL DISPOSABLE) ×2 IMPLANT
TOWEL OR 17X26 10 PK STRL BLUE (TOWEL DISPOSABLE) ×2 IMPLANT
TRAP SPECIMEN MUCOUS 40CC (MISCELLANEOUS) ×1 IMPLANT
TRAY FOLEY CATH 14FRSI W/METER (CATHETERS) ×2 IMPLANT
WATER STERILE IRR 1000ML POUR (IV SOLUTION) ×2 IMPLANT

## 2011-08-11 NOTE — Anesthesia Procedure Notes (Signed)
Procedure Name: Intubation Date/Time: 08/11/2011 2:28 PM Performed by: Margaree Mackintosh Pre-anesthesia Checklist: Patient identified, Timeout performed, Emergency Drugs available, Suction available and Patient being monitored Patient Re-evaluated:Patient Re-evaluated prior to inductionOxygen Delivery Method: Circle system utilized Preoxygenation: Pre-oxygenation with 100% oxygen Intubation Type: IV induction Ventilation: Mask ventilation with difficulty and Oral airway inserted - appropriate to patient size Laryngoscope Size: Mac and 3 Grade View: Grade II Tube type: Oral Tube size: 7.0 mm Number of attempts: 1 Airway Equipment and Method: Stylet Placement Confirmation: ETT inserted through vocal cords under direct vision,  positive ETCO2 and breath sounds checked- equal and bilateral Secured at: 22 cm Tube secured with: Tape Dental Injury: Teeth and Oropharynx as per pre-operative assessment

## 2011-08-11 NOTE — Progress Notes (Signed)
Patient ID: Jasmin Gordon, female   DOB: Feb 05, 1970, 41 y.o.   MRN: [id number] Patient doing the same and is ready for surgery today. Risks/benefits  Discussed. To proceed now.

## 2011-08-11 NOTE — Op Note (Signed)
Preop diagnosis: Herniated disc L4-5 left, recurrent and herniated disc L5-S1 left Postop diagnosis: Same Procedure: Bilateral L4-5 L5-S1 decompressive laminectomy followed by bilateral L4-5 L5-S1 microdiscectomy followed by L4-5 L5-S1 posterior lumbar interbody fusion with peek interbody spacer and Zimmer bony spacer at each level followed by segmental instrumentation L4-5 L5-S1 with Sequoia pedicle screw instrumentation followed by L4-5 L5-S1 posterolateral fusion Surgeon: Insurance risk surveyor: [place]  After and placed the prone position the patient's back was prepped and draped in usual sterile fashion. Fluoroscopy was used prior to incision to identify the appropriate level. Midline incision was made above the spinous processes of L3 L4-L5 and S1. Using Bovie cutting current the incision was carried on the spinous processes. Subperiosteal dissection was then carried out bilaterally on the spinous processes and lamina facet joint and the far lateral region to identify the transverse processes of L4 and L5 as well as the far lateral aspect of the sacrum. Self-retaining tract was placed for exposure and x-ray showed approach the appropriate levels. Using the Leksell rongeur spinous processes of L4-L5 and S1 removed. Starting the patient's left side generous laminotomy was performed removing the inferior 80% of the L4 lamina the medial three quarters of the facet joint and the superior one third of the L5 lamina. Residual bone was removed and saved for use later in the case ligamentum flavum was removed in a piecemeal fashion. Similar decompression was then carried out on the left L5-S1. Attention was then turned to the right side where similar decompressive laminotomy was performed. When the laminotomies were completed on the right side of residual midline structures were removed to complete the bilateral decompression. At this time starting at L4 upon the left discectomy was carried out. Is a very large disc  herniation with marked compression this was removed in a piecemeal fashion gave excellent decompression. The disc space was thoroughly cleaned out with pituitary rongeurs and curettes. At L5-S1 on the left similar discectomies carried out there was also a left-sided disc herniation. On the right side the disc spaces at L4-5 and L5-S1 were both cleaned out to prepare the disc spaces for interbody fusion. This was then performed. At L4-5 the disc was distracted up to a 10 mm size. Rotating cutter was then used to decorticate the endplates. On the first side we put a peek interbody spacer filled with autologous bone and morselized allograft. The spacer was impacted in good position. On the opposite side recent rotating cutter once again placed a bony spacer at this level on this side. Prior to placing the second spacer mixture of autologous bone morselized allograft was placed deep in the interspace to help with interbody fusion. Similar interbody fusion was then carried out at L5-S1. At this level the disc space was distracted up to an 8 mm size this was felt to be a good fit. At this level once again rotating cutters were used decorticate along with the Epstein curettes. Once again a peek spacer was placed to one side and a bony spacer on the opposite side. Once again morselized allograft and autologous bone were placed in the midline to help with interbody fusion. At this time pedicle screw fixation was carried out. High-speed drill was used to make entry points in standard fashion and then the pedicle all was passed from a lateral to medial direction L4-L5 and S1 on the patient's left side. We tapped with a 5.5 mm tap and placed 6.5 x 40 mm screws at L4 and L5 6.5 x 35  mm screw at S1. Similar procedure was carried out on the opposite side except for a 6.545 mm screw was placed L4. These were followed into excellent position under fluoroscopy and confirmed to be in good position under AP lateral imaging. We then  decorticated the far lateral region and placed a mixture of autologous bone morselized allograft to do a posterior lateral fusion L4-5 L5-S1. We then irrigated copiously metabolic irrigation controlled any bleeding with upper coagulation Gelfoam. We chose appropriate length rod and secured at the top of the screws with the top loading nuts with tightening and final tightening with torque and counter torque. We placed a cross-link and tightened in standard fashion. Final fluoroscopy in AP lateral direction looked excellent with good placed of the spacers screws and rods. We left an epidural drain in the epidural space and brought out through a separate stab wound incision. The was then closed in multiple layers of Vicryl on the muscle fascia subcutaneous and subcuticular tissues. Dermabond and Steri-Strips were placed on the skin. A sterile dressing was applied the patient was extubated and taken to recovery in stable condition.

## 2011-08-11 NOTE — Anesthesia Postprocedure Evaluation (Signed)
  Anesthesia Post-op Note  Patient: Annalee Meyerhoff [facility]  Procedure(s) Performed: Procedure(s) (LRB): POSTERIOR LUMBAR FUSION 2 LEVEL (N/A)  Patient Location: PACU  Anesthesia Type: General  Level of Consciousness: awake  Airway and Oxygen Therapy: Patient Spontanous Breathing  Post-op Pain: mild  Post-op Assessment: Post-op Vital signs reviewed, Patient's Cardiovascular Status Stable, Respiratory Function Stable, Patent Airway, No signs of Nausea or vomiting and Pain level controlled  Post-op Vital Signs: stable  Complications: No apparent anesthesia complications

## 2011-08-11 NOTE — Anesthesia Preprocedure Evaluation (Addendum)
Anesthesia Evaluation  Patient identified by MRN, date of birth, ID band Patient awake    Reviewed: Allergy & Precautions, H&P , NPO status , Patient's Chart, lab work & pertinent test results  History of Anesthesia Complications Negative for: history of anesthetic complications  Airway Mallampati: I TM Distance: >3 FB     Dental  (+) Dental Advisory Given and Teeth Intact   Pulmonary neg pulmonary ROS,  breath sounds clear to auscultation        Cardiovascular hypertension, Pt. on medications Rhythm:Regular Rate:Normal     Neuro/Psych  Neuromuscular disease (pain, weakness, numbness LLE) negative psych ROS   GI/Hepatic negative GI ROS, Neg liver ROS,   Endo/Other  Morbid obesity  Renal/GU negative Renal ROS     Musculoskeletal negative musculoskeletal ROS (+)   Abdominal (+) + obese,   Peds  Hematology negative hematology ROS (+)   Anesthesia Other Findings   Reproductive/Obstetrics                          Anesthesia Physical Anesthesia Plan  ASA: III  Anesthesia Plan: General   Post-op Pain Management:    Induction: Intravenous  Airway Management Planned: Oral ETT  Additional Equipment:   Intra-op Plan:   Post-operative Plan: Extubation in OR  Informed Consent: I have reviewed the patients History and Physical, chart, labs and discussed the procedure including the risks, benefits and alternatives for the proposed anesthesia with the patient or authorized representative who has indicated his/her understanding and acceptance.   Dental advisory given  Plan Discussed with: CRNA, Anesthesiologist and Surgeon  Anesthesia Plan Comments:         Anesthesia Quick Evaluation

## 2011-08-11 NOTE — Progress Notes (Signed)
PT Cancel Note:   Pt scheduled for back sx today.    Verdell Face, [place] [phone] 08/11/2011

## 2011-08-11 NOTE — Transfer of Care (Signed)
Immediate Anesthesia Transfer of Care Note  Patient: [employer]  Procedure(s) Performed: Procedure(s) (LRB): POSTERIOR LUMBAR FUSION 2 LEVEL (N/A)  Patient Location: PACU  Anesthesia Type: General  Level of Consciousness: sedated and pateint uncooperative  Airway & Oxygen Therapy: Patient Spontanous Breathing and Patient connected to nasal cannula oxygen  Post-op Assessment: Report given to PACU RN, Post -op Vital signs reviewed and stable and Patient moving all extremities X 4  Post vital signs: Reviewed and stable  Complications: No apparent anesthesia complications

## 2011-08-11 NOTE — Preoperative (Addendum)
Beta Blockers   Reason not to administer Beta Blockers:Metoprolol 08/11/11 0800

## 2011-08-11 NOTE — Progress Notes (Signed)
Subjective: Pain is controlled. No nausea or vomiting, no fever or chills.   Objective: Vital signs in last 24 hours: Temp:  [97.9 F (36.6 C)-98.8 F (37.1 C)] 97.9 F (36.6 C) (07/11 0521) Pulse Rate:  [103-122] 103  (07/11 0521) Resp:  [18-20] 18  (07/11 0521) BP: (129-144)/(90-103) 129/90 mmHg (07/11 0521) SpO2:  [96 %-98 %] 98 % (07/11 0521) Weight change:  Last BM Date: 08/10/11  Intake/Output from previous day: 07/10 0701 - 07/11 0700 In: 1200 [P.O.:1200] Out: -      Physical Exam: General:  Alert, communicative, fully oriented, not short of breath at rest.  HEENT:  No clinical pallor, no jaundice, no conjunctival injection or discharge. Hydration status is satisfactory. NECK:  Supple, JVP not seen, no carotid bruits, no palpable lymphadenopathy, no palpable goiter. CHEST:  Clinically clear to auscultation, no wheezes, no crackles. HEART:  Sounds 1 and 2 heard, normal, regular, no murmurs. ABDOMEN:  Morbidly obese, soft, non-tender, no palpable organomegaly, no palpable masses, normal bowel sounds. GENITALIA:  Not examined. LOWER EXTREMITIES:  No pitting edema, palpable peripheral pulses. MUSCULOSKELETAL SYSTEM:  Has tenderness lower lumbar/sacral spine, with left paraspinal muscle spasm. Unable to attempt straight-leg raising, secondary to significant pain down back of left leg, on slightest movement. CENTRAL NERVOUS SYSTEM:  No focal neurologic deficit on gross examination.  Lab Results:  [facility] 08/10/11 0612 08/09/11 0622  WBC 14.3* 15.6*  HGB 16.7* 15.6*  HCT 48.1* 44.2  PLT 367 346    Basename 08/10/11 0612 08/09/11 0622  NA 138 136  K 4.4 3.3*  CL 99 99  CO2 24 24  GLUCOSE 149* 171*  BUN 14 13  CREATININE 0.45* 0.47*  CALCIUM 9.8 9.5   Recent Results (from the past 240 hour(s))  URINE CULTURE     Status: Normal   Collection Time   08/09/11  5:30 AM      Component Value Range Status Comment   Specimen Description URINE, CLEAN CATCH   Final    Special Requests NONE   Final    Culture  Setup Time 08/09/2011 06:13   Final    Colony Count 20,OOO COLONIES/ML   Final    Culture     Final    Value: Multiple bacterial morphotypes present, none predominant. Suggest appropriate recollection if clinically indicated.   Report Status 08/10/2011 FINAL   Final      Studies/Results: No results found.  Medications: Scheduled Meds:    . amLODipine  10 mg Oral Daily  . ciprofloxacin  500 mg Oral BID  . cyclobenzaprine  5 mg Oral TID  . docusate sodium  100 mg Oral BID  . enoxaparin (LOVENOX) injection  50 mg Subcutaneous Once  . fentaNYL  25 mcg Transdermal Q72H  . metoprolol tartrate  12.5 mg Oral BID  . pantoprazole  40 mg Oral Q1200  . potassium chloride  40 mEq Oral Daily  . predniSONE  20 mg Oral BID  . senna  1 tablet Oral BID   Continuous Infusions:  PRN Meds:.HYDROmorphone, ketorolac, ondansetron (ZOFRAN) IV, ondansetron  Assessment/Plan:  Active Problems: 1. Intractable low back pain/Left radiculopathy:  Patient has history of disc desiccation in L4-L5, L5-S1, status post left laminectomy in L4. Since the beginning of this year, she has had recurrent lower back pain, and is now status post status 3 Depo shots. Oral prednisone was started on 08/03/11. Symptoms have steadily progressed, and she is now unable to ambulate. Clinically, she has a radiculopathy. Managing with  analgesics, and muscle relaxants. MRI showed new disc extrusion into the left lateral recess at L4-5 compressing the left L5 nerve root, small central disc protrusion at L5-S1 without neural  Impingement, enhancing scar tissue around the left S1 nerve root sleeve at L5-S1. Dr Gerlene Fee provided neurosurgical consultation, and surgery is planned for afternoon. Have stopped NSAIDS and Heparin. SCD ordered.  2. UTI: Patient has a positive urinary sediment, with pyuria and bacteriuria. Now on oral Ciprofloxacin, day#3. Urine culture shows 20,000 colonies with multiple  bacterial morphotypes. We will complete the course of ciprofloxacin.  3. HTN: BP is better controlled today. Patient has no previous history of hypertension. Will continue with Norvasc and add low dose B blocker.  4. Tachycardia; improved with metoprolol. EKG pending. Tachycardia, most likely from pain. Will continue to monitor.  5. Leukocytosis: probably from steroid injections. Improving. Continue to  Monitor.  6. DVT prophylaxis: lovenox.     LOS: 4 days   Jasmin Gordon 08/11/2011, 10:52 AM

## 2011-08-12 LAB — BASIC METABOLIC PANEL
CO2: 28 mEq/L (ref 19–32)
Calcium: 9.2 mg/dL (ref 8.4–10.5)
Chloride: 99 mEq/L (ref 96–112)
Sodium: 138 mEq/L (ref 135–145)

## 2011-08-12 LAB — CBC
MCH: 29.8 pg (ref 26.0–34.0)
Platelets: 303 10*3/uL (ref 150–400)
RBC: 4.47 MIL/uL (ref 3.87–5.11)
WBC: 17.2 10*3/uL — ABNORMAL HIGH (ref 4.0–10.5)

## 2011-08-12 MED FILL — Heparin Sodium (Porcine) Inj 1000 Unit/ML: INTRAMUSCULAR | Qty: 30 | Status: AC

## 2011-08-12 MED FILL — Sodium Chloride IV Soln 0.9%: INTRAVENOUS | Qty: 1000 | Status: AC

## 2011-08-12 NOTE — Progress Notes (Signed)
Subjective: Pain is controlled. No nausea or vomiting, no fever or chills.   Objective: Vital signs in last 24 hours: Temp:  [97 F (36.1 C)-98.4 F (36.9 C)] 98.3 F (36.8 C) (07/12 1525) Pulse Rate:  [83-115] 100  (07/12 1525) Resp:  [18-23] 18  (07/12 1525) BP: (115-130)/(63-82) 123/79 mmHg (07/12 1525) SpO2:  [96 %-100 %] 97 % (07/12 1525) FiO2 (%):  [2 %] 2 % (07/11 2015) Weight change:  Last BM Date: 08/10/11  Intake/Output from previous day: 07/11 0701 - 07/12 0700 In: 4025 [I.V.:3100; IV Piggyback:500] Out: 3010 [Urine:2810; Blood:200]     Physical Exam: General:  Alert, communicative, fully oriented, not short of breath at rest.  CHEST:  Clinically clear to auscultation, no wheezes, no crackles. HEART:  Sounds 1 and 2 heard, normal, regular, no murmurs. ABDOMEN:  Morbidly obese, soft, non-tender, no palpable organomegaly, no palpable masses, normal bowel sounds. LOWER EXTREMITIES:  No pitting edema, palpable peripheral pulses. MUSCULOSKELETAL SYSTEM:  Has tenderness lower lumbar/sacral spine, with left paraspinal muscle spasm.  CENTRAL NERVOUS SYSTEM:  No focal neurologic deficit on gross examination.  Lab Results:  Basename 08/12/11 0630 08/10/11 0612  WBC 17.2* 14.3*  HGB 13.3 16.7*  HCT 38.9 48.1*  PLT 303 367    Basename 08/12/11 0630 08/10/11 0612  NA 138 138  K 4.4 4.4  CL 99 99  CO2 28 24  GLUCOSE 149* 149*  BUN 14 14  CREATININE 0.59 0.45*  CALCIUM 9.2 9.8   Recent Results (from the past 240 hour(s))  URINE CULTURE     Status: Normal   Collection Time   08/09/11  5:30 AM      Component Value Range Status Comment   Specimen Description URINE, CLEAN CATCH   Final    Special Requests NONE   Final    Culture  Setup Time 08/09/2011 06:13   Final    Colony Count 20,OOO COLONIES/ML   Final    Culture     Final    Value: Multiple bacterial morphotypes present, none predominant. Suggest appropriate recollection if clinically indicated.   Report  Status 08/10/2011 FINAL   Final   SURGICAL PCR SCREEN     Status: Abnormal   Collection Time   08/11/11 12:48 PM      Component Value Range Status Comment   MRSA, PCR NEGATIVE  NEGATIVE Final    Staphylococcus aureus POSITIVE (*) NEGATIVE Final      Studies/Results: Dg Lumbar Spine 2-3 Views  08/11/2011  *RADIOLOGY REPORT*  Clinical Data: 41 year old female undergoing lumbar surgery.  DG C-ARM 61-120 MIN, LUMBAR SPINE - 2-3 VIEW  Comparison: Lumbar MRI 08/08/2011 and earlier.  Findings: Intraoperative fluoroscopic frontal and lateral views of the lower lumbar spine.  Transpedicular and interbody fusion hardware placed at L3-L4 and L4-L5.  These levels probably also have been decompressed.  Fluoroscopy time of 0.6 minutes was utilized.  IMPRESSION: L3-L4 and L4-L5 fusion depicted as above.  Original Report Authenticated By: Harley Hallmark, M.D.   Dg C-arm 61-120 Min  08/11/2011  *RADIOLOGY REPORT*  Clinical Data: 41 year old female undergoing lumbar surgery.  DG C-ARM 61-120 MIN, LUMBAR SPINE - 2-3 VIEW  Comparison: Lumbar MRI 08/08/2011 and earlier.  Findings: Intraoperative fluoroscopic frontal and lateral views of the lower lumbar spine.  Transpedicular and interbody fusion hardware placed at L3-L4 and L4-L5.  These levels probably also have been decompressed.  Fluoroscopy time of 0.6 minutes was utilized.  IMPRESSION: L3-L4 and L4-L5 fusion depicted as above.  Original Report Authenticated By: Harley Hallmark, M.D.    Medications: Scheduled Meds:    .  ceFAZolin (ANCEF) IV  1 g Intravenous Q8H  . dexamethasone  4 mg Intravenous Q6H   Or  . dexamethasone  4 mg Oral Q6H  . sodium chloride  3 mL Intravenous Q12H  . DISCONTD: amLODipine  10 mg Oral Daily  . DISCONTD: ciprofloxacin  500 mg Oral BID  . DISCONTD: cyclobenzaprine  5 mg Oral TID  . DISCONTD: docusate sodium  100 mg Oral BID  . DISCONTD: fentaNYL  25 mcg Transdermal Q72H  . DISCONTD: metoprolol tartrate  12.5 mg Oral BID  .  DISCONTD: pantoprazole  40 mg Oral Q1200  . DISCONTD: potassium chloride  40 mEq Oral Daily  . DISCONTD: predniSONE  20 mg Oral BID  . DISCONTD: senna  1 tablet Oral BID   Continuous Infusions:    . sodium chloride    . dextrose 5 % and 0.45 % NaCl with KCl 20 mEq/L     PRN Meds:.acetaminophen, acetaminophen, cyclobenzaprine, HYDROmorphone (DILAUDID) injection, HYDROmorphone (DILAUDID) injection, menthol-cetylpyridinium, ondansetron (ZOFRAN) IV, ondansetron (ZOFRAN) IV, oxyCODONE-acetaminophen, phenol, sodium chloride, zolpidem, DISCONTD: HYDROmorphone, DISCONTD: ketorolac, DISCONTD: ondansetron (ZOFRAN) IV, DISCONTD: ondansetron  Assessment/Plan:  Active Problems: 1. Intractable low back pain/Left radiculopathy:  Patient has history of disc desiccation in L4-L5, L5-S1, status post left laminectomy in L4. Since the beginning of this year, she has had recurrent lower back pain, and is now status post status 3 Depo shots. Oral prednisone was started on 08/03/11. Symptoms have steadily progressed, and she is now unable to ambulate. Clinically, she has a radiculopathy. Managing with analgesics, and muscle relaxants. MRI showed new disc extrusion into the left lateral recess at L4-5 compressing the left L5 nerve root, small central disc protrusion at L5-S1 without neural  Impingement, enhancing scar tissue around the left S1 nerve root sleeve at L5-S1. Dr Gerlene Fee provided neurosurgical consultation, and she underwent Bilateral L4-5 L5-S1 decompressive laminectomy followed by bilateral L4-5 L5-S1 microdiscectomy followed by L4-5 L5-S1 posterior lumbar interbody fusion, SCD's  ordered.  2. UTI: Patient has a positive urinary sediment, with pyuria and bacteriuria. Now on oral Ciprofloxacin, day#5 Urine culture shows 20,000 colonies with multiple bacterial morphotypes. We will complete the course of ciprofloxacin.  3. HTN: BP is better controlled today. Patient has no previous history of hypertension 4.  Tachycardia; improved with metoprolol. Tachycardia, most likely from pain. Will continue to monitor.  5. Leukocytosis: probably from steroid injections. Improving. Continue to  Monitor.     LOS: 5 days   Jasmin Gordon 08/12/2011, 7:28 PM

## 2011-08-12 NOTE — Progress Notes (Signed)
Utilization review completed. Neko Boyajian, RN, BSN. 

## 2011-08-12 NOTE — Progress Notes (Signed)
Orthopedic Tech Progress Note Patient Details:  Jasmin Gordon [facility] 12-21-1970 [id number]  Patient ID: Jasmin Gordon, female   DOB: 05-Jun-1970, 41 y.o.   MRN: [id number] Brace order completed by Jasmin Gordon, Jasmin Gordon 08/12/2011, 5:06 PM

## 2011-08-12 NOTE — Progress Notes (Signed)
Patient ID: Jasmin Gordon, female   DOB: 1970-05-11, 41 y.o.   MRN: [id number] Subjective: Patient reports no leg pain  Objective: Vital signs in last 24 hours: Temp:  [97 F (36.1 C)-99.1 F (37.3 C)] 97 F (36.1 C) (07/12 0514) Pulse Rate:  [83-115] 83  (07/12 0514) Resp:  [18-23] 18  (07/12 0514) BP: (115-130)/(63-82) 115/70 mmHg (07/12 0514) SpO2:  [96 %-100 %] 97 % (07/12 0514) FiO2 (%):  [2 %] 2 % (07/11 2015)  Intake/Output from previous day: 07/11 0701 - 07/12 0700 In: 4025 [I.V.:3100; IV Piggyback:500] Out: 3010 [Urine:2810; Blood:200] Intake/Output this shift: Total I/O In: -  Out: 1500 [Urine:1500]  Wound:clean and dry  Lab Results:  [facility] 08/12/11 0630 08/10/11 0612  WBC 17.2* 14.3*  HGB 13.3 16.7*  HCT 38.9 48.1*  PLT 303 367   BMET  Basename 08/12/11 0630 08/10/11 0612  NA 138 138  K 4.4 4.4  CL 99 99  CO2 28 24  GLUCOSE 149* 149*  BUN 14 14  CREATININE 0.59 0.45*  CALCIUM 9.2 9.8    Studies/Results: Dg Lumbar Spine 2-3 Views  08/11/2011  *RADIOLOGY REPORT*  Clinical Data: 41 year old female undergoing lumbar surgery.  DG C-ARM 61-120 MIN, LUMBAR SPINE - 2-3 VIEW  Comparison: Lumbar MRI 08/08/2011 and earlier.  Findings: Intraoperative fluoroscopic frontal and lateral views of the lower lumbar spine.  Transpedicular and interbody fusion hardware placed at L3-L4 and L4-L5.  These levels probably also have been decompressed.  Fluoroscopy time of 0.6 minutes was utilized.  IMPRESSION: L3-L4 and L4-L5 fusion depicted as above.  Original Report Authenticated By: Harley Hallmark, M.D.   Dg C-arm 61-120 Min  08/11/2011  *RADIOLOGY REPORT*  Clinical Data: 41 year old female undergoing lumbar surgery.  DG C-ARM 61-120 MIN, LUMBAR SPINE - 2-3 VIEW  Comparison: Lumbar MRI 08/08/2011 and earlier.  Findings: Intraoperative fluoroscopic frontal and lateral views of the lower lumbar spine.  Transpedicular and interbody fusion hardware placed at L3-L4 and L4-L5.   These levels probably also have been decompressed.  Fluoroscopy time of 0.6 minutes was utilized.  IMPRESSION: L3-L4 and L4-L5 fusion depicted as above.  Original Report Authenticated By: Harley Hallmark, M.D.    Assessment/Plan: Doing very well POD 1. Will increase activity. Home next 1-2 days.   LOS: 5 days  As above   Reinaldo Meeker, MD 08/12/2011, 2:28 PM

## 2011-08-12 NOTE — Progress Notes (Signed)
Physical Therapy Note   08/12/11 1432  PT Visit Information  Last PT Received On 08/12/11  Reason Eval/Treat Not Completed (awaiting for brace from [employer])  PT General Charges  $PT Canceled Visit 1 Procedure     PT to complete eval as able when brace arrives, most likely first thing 7/13 AM.  Lewis Shock, PT, DPT Pager #: [phone] Office #: [phone]

## 2011-08-12 NOTE — Evaluation (Signed)
Physical Therapy Evaluation Patient Details Name: Jasmin Gordon MRN: [id number] DOB: 1970/10/17 Today's Date: 08/12/2011 Time: 4098-1191 PT Time Calculation (min): 27 min  PT Assessment / Plan / Recommendation Clinical Impression  Patient is a 41 yo female with h/o lumbar laminectomy, now s/p L4-S1 PLIF.  Patient moving very slowly during mobility/gait today due to pain. Will follow patient acutely for mobility, gait training, and education.  May benefit from HHPT at discharge.    PT Assessment  Patient needs continued PT services    Follow Up Recommendations  Home health PT;Supervision/Assistance - 24 hour    Barriers to Discharge None      Equipment Recommendations  Rolling walker with 5" wheels;3 in 1 bedside comode    Recommendations for Other Services OT consult   Frequency Min 5X/week    Precautions / Restrictions Precautions Precautions: Back Precaution Booklet Issued: Yes (comment) (Reviewed back precautions with patient) Precaution Comments: Reviewed with patient Required Braces or Orthoses: Spinal Brace Spinal Brace: Lumbar corset;Applied in sitting position Restrictions Weight Bearing Restrictions: No   Pertinent Vitals/Pain Pain limiting session today.      Mobility  Bed Mobility Bed Mobility: Rolling Right;Right Sidelying to Sit;Sitting - Scoot to [place] of Bed Rolling Right: 4: Min assist Right Sidelying to Sit: 3: Mod assist;With rails;HOB flat Sitting - Scoot to Edge of Bed: 4: Min guard Details for Bed Mobility Assistance: Verbal cues for technique and to maintain back precautions.  Patient required physical assist - mobility limited by pain and weakness. Transfers Transfers: Sit to Stand;Stand to Sit Sit to Stand: 4: Min assist;With upper extremity assist;From bed Stand to Sit: 4: Min guard;With upper extremity assist;With armrests;To chair/3-in-1 Details for Transfer Assistance: Verbal cues for safety and proper technique.  Required increased time due  to pain. Ambulation/Gait Ambulation/Gait Assistance: 4: Min assist Ambulation Distance (Feet): 15 Feet Assistive device: Rolling walker Ambulation/Gait Assistance Details: Patient required min assist for safety and to maneuver RW.  Pain impacting gait - slow gait with increased pain. Gait Pattern: Step-through pattern;Decreased stride length;Shuffle;Antalgic Gait velocity: Slow gait speed General Gait Details: Increased pain in back impacting gait.    Exercises     PT Diagnosis: Difficulty walking;Generalized weakness;Acute pain  PT Problem List: Decreased strength;Decreased activity tolerance;Decreased mobility;Decreased knowledge of use of DME;Decreased knowledge of precautions;Pain PT Treatment Interventions: DME instruction;Gait training;Stair training;Functional mobility training;Patient/family education   PT Goals Acute Rehab PT Goals PT Goal Formulation: With patient Time For Goal Achievement: 08/19/11 Potential to Achieve Goals: Good Pt will Roll Supine to Right Side: Independently PT Goal: Rolling Supine to Right Side - Progress: Goal set today Pt will Roll Supine to Left Side: Independently PT Goal: Rolling Supine to Left Side - Progress: Goal set today Pt will go Supine/Side to Sit: with supervision;with HOB 0 degrees PT Goal: Supine/Side to Sit - Progress: Goal set today Pt will go Sit to Supine/Side: with supervision;with HOB 0 degrees PT Goal: Sit to Supine/Side - Progress: Goal set today Pt will go Sit to Stand: with supervision;with upper extremity assist PT Goal: Sit to Stand - Progress: Goal set today Pt will go Stand to Sit: with supervision;with upper extremity assist PT Goal: Stand to Sit - Progress: Goal set today Pt will Ambulate: 51 - 150 feet;with supervision;with rolling walker PT Goal: Ambulate - Progress: Goal set today Pt will Go Up / Down Stairs: 1-2 stairs;with supervision;with rail(s) PT Goal: Up/Down Stairs - Progress: Goal set today Additional  Goals Additional Goal #1: Patient will recall  3/3 back precautions and integrate into functional mobility. PT Goal: Additional Goal #1 - Progress: Goal set today Additional Goal #2: Patient will don/doff back brace with min assist. PT Goal: Additional Goal #2 - Progress: Goal set today  Visit Information  Last PT Received On: 08/12/11 Assistance Needed: +1    Subjective Data  Subjective: Patient reports significant back pain Patient Stated Goal: To decrease pain.  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 Communication Communication: No difficulties Dominant Hand: Right    Cognition  Overall Cognitive Status: Appears within functional limits for tasks assessed/performed Arousal/Alertness: Awake/alert Orientation Level: Oriented X4 / Intact Behavior During Session: [facility] for tasks performed    Extremity/Trunk Assessment Right Upper Extremity Assessment RUE ROM/Strength/Tone: Within functional levels Left Upper Extremity Assessment LUE ROM/Strength/Tone: Within functional levels Right Lower Extremity Assessment RLE ROM/Strength/Tone: Deficits;Unable to fully assess;Due to pain RLE ROM/Strength/Tone Deficits: Able to move LE against gravity Left Lower Extremity Assessment LLE ROM/Strength/Tone: Deficits;Unable to fully assess;Due to pain LLE ROM/Strength/Tone Deficits: Able to move LE against gravity   Balance    End  of Session PT - End of Session Equipment Utilized During Treatment: Gait belt;Back brace Activity Tolerance: Patient limited by pain Patient left: in chair;with call bell/phone within reach;with nursing in room Nurse Communication: Mobility status;Patient requests pain meds  GP     Vena Austria 08/12/2011, 6:44 PM Durenda Hurt. Renaldo Fiddler, [facility] Acute Rehab Services Pager [phone]

## 2011-08-13 MED ORDER — SENNOSIDES-DOCUSATE SODIUM 8.6-50 MG PO TABS
1.0000 | ORAL_TABLET | Freq: Two times a day (BID) | ORAL | Status: DC
  Administered 2011-08-13 – 2011-08-14 (×3): 1 via ORAL
  Filled 2011-08-13 (×3): qty 1

## 2011-08-13 NOTE — Progress Notes (Signed)
Orthopedic Tech Progress Note Patient Details:  [name] [facility] 1970/02/12 [id number]  Patient ID: Jasmin Gordon, female   DOB: October 04, 1970, 41 y.o.   MRN: [id number]   [name] 08/13/2011, 9:18 AM Corset applied by Loman Brooklyn from [employer]

## 2011-08-13 NOTE — Progress Notes (Signed)
Subjective: Pain is controlled. No nausea or vomiting, no fever or chills.   Objective: Vital signs in last 24 hours: Temp:  [98 F (36.7 C)-98.4 F (36.9 C)] 98.4 F (36.9 C) (07/13 1422) Pulse Rate:  [102-127] 127  (07/13 1422) Resp:  [16-20] 20  (07/13 1422) BP: (106-127)/(66-74) 127/74 mmHg (07/13 1422) SpO2:  [95 %-98 %] 98 % (07/13 1422) Weight change:  Last BM Date: 08/10/11  Intake/Output from previous day: 07/12 0701 - 07/13 0700 In: 720 [P.O.:720] Out: 2425 [Urine:2200; Drains:225]     Physical Exam: General:  Alert, communicative, fully oriented, not short of breath at rest.  CHEST:  Clinically clear to auscultation, no wheezes, no crackles. HEART:  Sounds 1 and 2 heard, normal, regular, no murmurs. ABDOMEN:  Morbidly obese, soft, non-tender, no palpable organomegaly, no palpable masses, normal bowel sounds. LOWER EXTREMITIES:  No pitting edema, palpable peripheral pulses. MUSCULOSKELETAL SYSTEM:  Has tenderness lower lumbar/sacral spine, with left paraspinal muscle spasm.  CENTRAL NERVOUS SYSTEM:  No focal neurologic deficit on gross examination.  Lab Results:  Basename 08/12/11 0630  WBC 17.2*  HGB 13.3  HCT 38.9  PLT 303    Basename 08/12/11 0630  NA 138  K 4.4  CL 99  CO2 28  GLUCOSE 149*  BUN 14  CREATININE 0.59  CALCIUM 9.2   Recent Results (from the past 240 hour(s))  URINE CULTURE     Status: Normal   Collection Time   08/09/11  5:30 AM      Component Value Range Status Comment   Specimen Description URINE, CLEAN CATCH   Final    Special Requests NONE   Final    Culture  Setup Time 08/09/2011 06:13   Final    Colony Count 20,OOO COLONIES/ML   Final    Culture     Final    Value: Multiple bacterial morphotypes present, none predominant. Suggest appropriate recollection if clinically indicated.   Report Status 08/10/2011 FINAL   Final   SURGICAL PCR SCREEN     Status: Abnormal   Collection Time   08/11/11 12:48 PM      Component Value  Range Status Comment   MRSA, PCR NEGATIVE  NEGATIVE Final    Staphylococcus aureus POSITIVE (*) NEGATIVE Final      Studies/Results: Dg Lumbar Spine 2-3 Views  08/11/2011  *RADIOLOGY REPORT*  Clinical Data: 41 year old female undergoing lumbar surgery.  DG C-ARM 61-120 MIN, LUMBAR SPINE - 2-3 VIEW  Comparison: Lumbar MRI 08/08/2011 and earlier.  Findings: Intraoperative fluoroscopic frontal and lateral views of the lower lumbar spine.  Transpedicular and interbody fusion hardware placed at L3-L4 and L4-L5.  These levels probably also have been decompressed.  Fluoroscopy time of 0.6 minutes was utilized.  IMPRESSION: L3-L4 and L4-L5 fusion depicted as above.  Original Report Authenticated By: Harley Hallmark, M.D.   Dg C-arm 61-120 Min  08/11/2011  *RADIOLOGY REPORT*  Clinical Data: 41 year old female undergoing lumbar surgery.  DG C-ARM 61-120 MIN, LUMBAR SPINE - 2-3 VIEW  Comparison: Lumbar MRI 08/08/2011 and earlier.  Findings: Intraoperative fluoroscopic frontal and lateral views of the lower lumbar spine.  Transpedicular and interbody fusion hardware placed at L3-L4 and L4-L5.  These levels probably also have been decompressed.  Fluoroscopy time of 0.6 minutes was utilized.  IMPRESSION: L3-L4 and L4-L5 fusion depicted as above.  Original Report Authenticated By: Harley Hallmark, M.D.    Medications: Scheduled Meds:    . senna-docusate  1 tablet Oral BID  .  sodium chloride  3 mL Intravenous Q12H   Continuous Infusions:    . DISCONTD: sodium chloride    . DISCONTD: dextrose 5 % and 0.45 % NaCl with KCl 20 mEq/L     PRN Meds:.acetaminophen, acetaminophen, cyclobenzaprine, HYDROmorphone (DILAUDID) injection, HYDROmorphone (DILAUDID) injection, menthol-cetylpyridinium, ondansetron (ZOFRAN) IV, oxyCODONE-acetaminophen, phenol, sodium chloride, zolpidem  Assessment/Plan:  Active Problems: 1. 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 Since the beginning of this year, she has had recurrent lower back pain, and is now status post status 3 Depo shots. Oral prednisone was started on 08/03/11. Symptoms have steadily progressed, and she is now unable to ambulate. Clinically, she has a radiculopathy. Managing with analgesics, and muscle relaxants. MRI showed new disc extrusion into the left lateral recess at L4-5 compressing the left L5 nerve root, small central disc protrusion at L5-S1 without neural  Impingement, enhancing scar tissue around the left S1 nerve root sleeve at L5-S1. Dr Gerlene Fee provided neurosurgical consultation, and she underwent Bilateral L4-5 L5-S1 decompressive laminectomy followed by bilateral L4-5 L5-S1 microdiscectomy followed by L4-5 L5-S1 posterior lumbar interbody fusion, SCD's  ordered.  2. UTI: Patient has a positive urinary sediment, with pyuria and bacteriuria. Now on oral Ciprofloxacin, day#5 Urine culture shows 20,000 colonies with multiple bacterial morphotypes. We will complete the course of ciprofloxacin.  3. HTN: BP is better controlled today. Patient has no previous history of hypertension 4. Tachycardia; improved with metoprolol. Tachycardia, most likely from pain. Will continue to monitor.  5. Leukocytosis: probably from steroid injections. Improving. Continue to  Monitor. wll repeat CBC IN AM.     LOS: 6 days   Jasmin Gordon 08/13/2011, 5:09 PM

## 2011-08-13 NOTE — Progress Notes (Signed)
Pt ambulated to bathroom with brace and front wheel walker with much difficulty pt lack understanding of how to walk with walker and had a lot of difficulty getting oob. Not able to walk in hall due to pain will continue to monitor. Ilean Skill LPN

## 2011-08-13 NOTE — Progress Notes (Signed)
Subjective: Patient reports Is doing much better no leg pain his little bit of tingling that was there before it is her baseline  Objective: Vital signs in last 24 hours: Temp:  [98 F (36.7 C)-98.3 F (36.8 C)] 98.2 F (36.8 C) (07/13 0511) Pulse Rate:  [100-107] 102  (07/13 0511) Resp:  [16-18] 16  (07/13 0511) BP: (106-123)/(66-79) 106/66 mmHg (07/13 0511) SpO2:  [95 %-97 %] 95 % (07/13 0511)  Intake/Output from previous day: 07/12 0701 - 07/13 0700 In: 720 [P.O.:720] Out: 2425 [Urine:2200; Drains:225] Intake/Output this shift:    Strength is 5 out of 5 wound is clean and dry  Lab Results:  Basename 08/12/11 0630  WBC 17.2*  HGB 13.3  HCT 38.9  PLT 303   BMET  Basename 08/12/11 0630  NA 138  K 4.4  CL 99  CO2 28  GLUCOSE 149*  BUN 14  CREATININE 0.59  CALCIUM 9.2    Studies/Results: Dg Lumbar Spine 2-3 Views  08/11/2011  *RADIOLOGY REPORT*  Clinical Data: 41 year old female undergoing lumbar surgery.  DG C-ARM 61-120 MIN, LUMBAR SPINE - 2-3 VIEW  Comparison: Lumbar MRI 08/08/2011 and earlier.  Findings: Intraoperative fluoroscopic frontal and lateral views of the lower lumbar spine.  Transpedicular and interbody fusion hardware placed at L3-L4 and L4-L5.  These levels probably also have been decompressed.  Fluoroscopy time of 0.6 minutes was utilized.  IMPRESSION: L3-L4 and L4-L5 fusion depicted as above.  Original Report Authenticated By: Harley Hallmark, M.D.   Dg C-arm 61-120 Min  08/11/2011  *RADIOLOGY REPORT*  Clinical Data: 41 year old female undergoing lumbar surgery.  DG C-ARM 61-120 MIN, LUMBAR SPINE - 2-3 VIEW  Comparison: Lumbar MRI 08/08/2011 and earlier.  Findings: Intraoperative fluoroscopic frontal and lateral views of the lower lumbar spine.  Transpedicular and interbody fusion hardware placed at L3-L4 and L4-L5.  These levels probably also have been decompressed.  Fluoroscopy time of 0.6 minutes was utilized.  IMPRESSION: L3-L4 and L4-L5 fusion  depicted as above.  Original Report Authenticated By: Harley Hallmark, M.D.    Assessment/Plan: Continue to work on pain management progressive mobilization with physical therapy she posterior day 2 from a pleasant L3-4-4 5 we'll look towards discharge Sunday or Monday  LOS: 6 days     Colin Ellers P 08/13/2011, 11:12 AM

## 2011-08-14 DIAGNOSIS — K5900 Constipation, unspecified: Secondary | ICD-10-CM

## 2011-08-14 HISTORY — DX: Constipation, unspecified: K59.00

## 2011-08-14 LAB — CBC
Platelets: 280 10*3/uL (ref 150–400)
RDW: 12.8 % (ref 11.5–15.5)
WBC: 13.5 10*3/uL — ABNORMAL HIGH (ref 4.0–10.5)

## 2011-08-14 MED ORDER — SENNOSIDES-DOCUSATE SODIUM 8.6-50 MG PO TABS
2.0000 | ORAL_TABLET | Freq: Two times a day (BID) | ORAL | Status: DC
  Administered 2011-08-14 – 2011-08-15 (×2): 2 via ORAL
  Filled 2011-08-14 (×2): qty 2

## 2011-08-14 MED ORDER — MAGNESIUM HYDROXIDE 400 MG/5ML PO SUSP
30.0000 mL | Freq: Every day | ORAL | Status: DC | PRN
  Administered 2011-08-14: 30 mL via ORAL
  Filled 2011-08-14: qty 30

## 2011-08-14 NOTE — Progress Notes (Addendum)
Subjective: Pain is controlled. No nausea or vomiting, no fever or chills.  Complaining of constipation.   Objective: Vital signs in last 24 hours: Temp:  [98.2 F (36.8 C)-98.6 F (37 C)] 98.2 F (36.8 C) (07/14 0553) Pulse Rate:  [103-127] 104  (07/14 0553) Resp:  [16-20] 18  (07/14 0553) BP: (113-139)/(74-82) 118/82 mmHg (07/14 0553) SpO2:  [98 %] 98 % (07/14 0553) Weight change:  Last BM Date: 08/10/11  Intake/Output from previous day: 07/13 0701 - 07/14 0700 In: 633 [P.O.:480; I.V.:3] Out: 50 [Drains:50]     Physical Exam: General:  Alert, communicative, fully oriented, not short of breath at rest.  CHEST:  Clinically clear to auscultation, no wheezes, no crackles. HEART:  Sounds 1 and 2 heard, normal, regular, no murmurs. ABDOMEN:  Morbidly obese, soft, non-tender, no palpable organomegaly, no palpable masses, normal bowel sounds. LOWER EXTREMITIES:  No pitting edema, palpable peripheral pulses. MUSCULOSKELETAL SYSTEM:  Has tenderness lower lumbar/sacral spine, with left paraspinal muscle spasm.  CENTRAL NERVOUS SYSTEM:  No focal neurologic deficit on gross examination.  Lab Results:  Basename 08/14/11 0635 08/12/11 0630  WBC 13.5* 17.2*  HGB 11.9* 13.3  HCT 35.8* 38.9  PLT 280 303    Basename 08/12/11 0630  NA 138  K 4.4  CL 99  CO2 28  GLUCOSE 149*  BUN 14  CREATININE 0.59  CALCIUM 9.2   Recent Results (from the past 240 hour(s))  URINE CULTURE     Status: Normal   Collection Time   08/09/11  5:30 AM      Component Value Range Status Comment   Specimen Description URINE, CLEAN CATCH   Final    Special Requests NONE   Final    Culture  Setup Time 08/09/2011 06:13   Final    Colony Count 20,OOO COLONIES/ML   Final    Culture     Final    Value: Multiple bacterial morphotypes present, none predominant. Suggest appropriate recollection if clinically indicated.   Report Status 08/10/2011 FINAL   Final   SURGICAL PCR SCREEN     Status: Abnormal   Collection Time   08/11/11 12:48 PM      Component Value Range Status Comment   MRSA, PCR NEGATIVE  NEGATIVE Final    Staphylococcus aureus POSITIVE (*) NEGATIVE Final      Studies/Results: No results found.  Medications: Scheduled Meds:    . senna-docusate  1 tablet Oral BID  . sodium chloride  3 mL Intravenous Q12H   Continuous Infusions:   PRN Meds:.acetaminophen, acetaminophen, cyclobenzaprine, HYDROmorphone (DILAUDID) injection, HYDROmorphone (DILAUDID) injection, magnesium hydroxide, menthol-cetylpyridinium, ondansetron (ZOFRAN) IV, oxyCODONE-acetaminophen, phenol, sodium chloride, zolpidem  Assessment/Plan:  Active Problems: 1. Intractable low back pain/Left radiculopathy:  Patient has history of disc desiccation in L4-L5, L5-S1, status post left laminectomy in L4. Since the beginning of this year, she has had recurrent lower back pain, and is now status post status 3 Depo shots. Oral prednisone was started on 08/03/11. Symptoms have steadily progressed, and she is now unable to ambulate. Clinically, she has a radiculopathy. Managing with analgesics, and muscle relaxants. MRI showed new disc extrusion into the left lateral recess at L4-5 compressing the left L5 nerve root, small central disc protrusion at L5-S1 without neural  Impingement, enhancing scar tissue around the left S1 nerve root sleeve at L5-S1. Dr Gerlene Fee provided neurosurgical consultation, and she underwent Bilateral L4-5 L5-S1 decompressive laminectomy followed by bilateral L4-5 L5-S1 microdiscectomy followed by L4-5 L5-S1 posterior lumbar interbody fusion,  SCD's  ordered.  2. UTI: Patient has a positive urinary sediment, with pyuria and bacteriuria.completed the course of antibiotics. Urine culture shows 20,000 colonies with multiple bacterial morphotypes.  3. HTN: BP is better controlled today. Patient has no previous history of hypertension 4. Tachycardia; improved with metoprolol. Tachycardia, most likely from  pain. Will continue to monitor.  5. Leukocytosis: probably from steroid injections. Improving. Continue to  Monitor. wll repeat CBC IN AM.  6. Constipation: started  On milk of magnesia and colace.  7. DVT Prophylaxis  Dispo: possible d/c home with home PT.    LOS: 7 days   Boruch Manuele 08/14/2011, 11:56 AM

## 2011-08-14 NOTE — Progress Notes (Signed)
Subjective: Patient reports Reports moderate leg pain still neck pain with lower abdominal pain still problematic for her, constipation  Objective: Vital signs in last 24 hours: Temp:  [98.2 F (36.8 C)-98.6 F (37 C)] 98.2 F (36.8 C) (07/14 0553) Pulse Rate:  [103-127] 104  (07/14 0553) Resp:  [16-20] 18  (07/14 0553) BP: (113-139)/(74-82) 118/82 mmHg (07/14 0553) SpO2:  [98 %] 98 % (07/14 0553)  Intake/Output from previous day: 07/13 0701 - 07/14 0700 In: 633 [P.O.:480; I.V.:3] Out: 50 [Drains:50] Intake/Output this shift:    Dressing clean and dry motor function good in lower extremities however patient remains very slow moving  Lab Results:  Basename 08/14/11 0635 08/12/11 0630  WBC 13.5* 17.2*  HGB 11.9* 13.3  HCT 35.8* 38.9  PLT 280 303   BMET  Basename 08/12/11 0630  NA 138  K 4.4  CL 99  CO2 28  GLUCOSE 149*  BUN 14  CREATININE 0.59  CALCIUM 9.2    Studies/Results: No results found.  Assessment/Plan: Stable postop lumbar fusion, constipation, residual radiculopathy  LOS: 7 days  Does not feel ready for discharge were independent inactivity at this time in addition constipation remains problematic. Will order milk of magnesia.   Juanjose Mojica J 08/14/2011, 8:49 AM

## 2011-08-14 NOTE — Progress Notes (Signed)
Physical Therapy Treatment Patient Details Name: Jasmin Gordon MRN: [id number] DOB: 02-May-1970 Today's Date: 08/14/2011 Time: 4098-1191 PT Time Calculation (min): 24 min  PT Assessment / Plan / Recommendation Comments on Treatment Session  Pt hesitant during mobility, although discussed how pt feels better during ambulation, sitting hurts her the worst. Pt does not feel as though she is ready to go home as only her kids will be there. Wiill continue per plan    Follow Up Recommendations  Home health PT;Supervision/Assistance - 24 hour    Barriers to Discharge        Equipment Recommendations  Rolling walker with 5" wheels;3 in 1 bedside comode    Recommendations for Other Services    Frequency Min 5X/week   Plan Discharge plan remains appropriate;Frequency remains appropriate    Precautions / Restrictions Precautions Precautions: Back Precaution Comments: pt able to verbalize 2/3 back precautions. Cueing throughout for safety Required Braces or Orthoses: Spinal Brace Spinal Brace: Lumbar corset Restrictions Weight Bearing Restrictions: No       Mobility  Bed Mobility Bed Mobility: Not assessed Transfers Transfers: Sit to Stand;Stand to Sit Sit to Stand: 4: Min assist;With upper extremity assist;From chair/3-in-1 Stand to Sit: 4: Min assist;With upper extremity assist;To chair/3-in-1 Details for Transfer Assistance: VC for hand placement and proper sequencing. Pt with increased pain upon standing, increased time to complete. Ambulation/Gait Ambulation/Gait Assistance: 4: Min guard Ambulation Distance (Feet): 40 Feet Assistive device: Rolling walker Ambulation/Gait Assistance Details: Pt with decreased pain with ambulation, although slow movement as well as grunting throughout. No buckling noted throughout ambulation. Minguard for safety Gait Pattern: Step-through pattern;Decreased stride length;Antalgic Gait velocity: decreased    Exercises     PT Diagnosis:    PT  Problem List:   PT Treatment Interventions:     PT Goals Acute Rehab PT Goals PT Goal Formulation: With patient PT Goal: Sit to Stand - Progress: Progressing toward goal PT Goal: Stand to Sit - Progress: Progressing toward goal PT Goal: Ambulate - Progress: Progressing toward goal Additional Goals PT Goal: Additional Goal #1 - Progress: Progressing toward goal PT Goal: Additional Goal #2 - Progress: Progressing toward goal  Visit Information  Last PT Received On: 08/14/11 Assistance Needed: +1    Subjective Data      Cognition  Overall Cognitive Status: Appears within functional limits for tasks assessed/performed Arousal/Alertness: Awake/alert Orientation Level: Oriented X4 / Intact Behavior During Session: [facility] for tasks performed    Balance     End of Session PT - End of Session Equipment Utilized During Treatment: Gait belt;Back brace Activity Tolerance: Patient tolerated treatment well Patient left: in chair;with family/visitor present (in bathroom, RN and NT aware) Nurse Communication: Mobility status   GP     Milana Kidney 08/14/2011, 5:51 PM  08/14/2011 Milana Kidney DPT PAGER: [phone] OFFICE: [phone]

## 2011-08-15 LAB — CBC
HCT: 35.2 % — ABNORMAL LOW (ref 36.0–46.0)
MCHC: 33 g/dL (ref 30.0–36.0)
MCV: 88.9 fL (ref 78.0–100.0)
RDW: 12.9 % (ref 11.5–15.5)

## 2011-08-15 MED ORDER — CYCLOBENZAPRINE HCL 10 MG PO TABS: 10.0000 mg | ORAL_TABLET | Freq: Three times a day (TID) | ORAL | Status: AC | PRN

## 2011-08-15 MED ORDER — OXYCODONE-ACETAMINOPHEN 5-325 MG PO TABS: 1.0000 | ORAL_TABLET | ORAL | Status: AC | PRN

## 2011-08-15 NOTE — Progress Notes (Signed)
Patient ID: Jasmin Gordon, female   DOB: 05/29/1970, 41 y.o.   MRN: [id number] Physician Discharge Summary  Patient ID: Jasmin Gordon MRN: [id number] DOB/AGE: [birth year] 41 y.o.  Admit date: 08/07/2011 Discharge date: 08/15/2011  Admission Diagnoses:  Discharge Diagnoses:  Principal Problem:  *Intractable low back pain Active Problems:  UTI (lower urinary tract infection)  Radiculopathy, lumbosacral region  HTN (hypertension)  Constipation   Discharged Condition: good  Hospital Course: Admitted by medical service with severe left leg pain. 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 Consulted and taken to surgery for 2 level plif. Did well with marked improvement in her pain. Dealt with constipation post op that resolved and patient then ready for d/c. Sent home 7/15, specific inst given.  Consults: None  Significant Diagnostic Studies: none  Treatments: surgery: L45, L5S1 PLIF  Discharge Exam: Blood pressure 129/80, pulse 90, temperature 98.3 F (36.8 C), temperature source Oral, resp. rate 18, height 5\' 1"  (1.549 m), weight 107.956 kg (238 lb), SpO2 97.00%. Incision/Wound:healing well  Disposition:   Discharge Orders    Future Orders Please Complete By Expires   Diet general      Discharge instructions      Comments:   Mostly bedrest. Get up 9 or 10 times each day and walk for 15-20 minutes each time. Very little sitting the first week. No riding in the car until your first post op appointment. If you had neck surgery...may shower from the chest down. If you had low back surgery....you may shower with a saran wrap covering over the incision. Take your pain medicine as needed...and other medicines that you are instructed to take. Call for an appointment...[phone].   Call MD for:  temperature >100.4      Call MD for:  persistant nausea and vomiting      Call MD for:  severe uncontrolled pain      Call MD for:  redness, tenderness, or signs of infection (pain, swelling, redness,  odor or green/yellow discharge around incision site)      Call MD for:  difficulty breathing, headache or visual disturbances      Call MD for:  hives        Medication List  As of 08/15/2011  8:45 AM   STOP taking these medications         ibuprofen 200 MG tablet      ketorolac 10 MG tablet      OVER THE COUNTER MEDICATION      predniSONE 20 MG tablet         TAKE these medications         cyclobenzaprine 10 MG tablet   Commonly known as: FLEXERIL   Take 1 tablet (10 mg total) by mouth 3 (three) times daily as needed for muscle spasms.      oxyCODONE-acetaminophen 5-325 MG per tablet   Commonly known as: PERCOCET   Take 1-2 tablets by mouth every 4 (four) hours as needed.           Follow-up Information    Follow up with [facility] Valrie Hart., MD in 2 days.   Contact information:   [address] [place] [address] [phone]          At home rest most of the time. Get up 9 or 10 times each day and take a 15 or 20 minute walk. No riding in the car and to your first postoperative appointment. If you have neck surgery you  may shower from the chest down starting on the third postoperative day. If you had back surgery he may start showering on the third postoperative day with saran wrap wrapped around your incisional area 3 times. After the shower remove the saran wrap. Take pain medicine as needed and other medications as instructed. Call my office for an appointment.  SignedReinaldo Meeker, MD 08/15/2011, 8:45 AM

## 2011-08-15 NOTE — Progress Notes (Signed)
Patient was discharged home by neuro surgery .   Kathlen Mody, MD [phone]

## 2011-08-15 NOTE — Care Management Note (Addendum)
  Page 2 of 2   08/15/2011     11:45:35 AM   CARE MANAGEMENT NOTE 08/15/2011  Patient:  Jasmin Gordon, Jasmin Gordon   Account Number:  [id number]  Date Initiated:  08/15/2011  Documentation initiated by:  Anette Guarneri  Subjective/Objective Assessment:   POD#4 s/p lumbar fusion  PT recommends HHPT  needs RW     Action/Plan:   home with [facility] services and DME   Anticipated DC Date:  08/15/2011   Anticipated DC Plan:  HOME W HOME HEALTH SERVICES      DC Planning Services  CM consult      [facility] Choice  DURABLE MEDICAL EQUIPMENT  HOME HEALTH   Choice offered to / List presented to:  C-1 Patient   DME arranged  Levan Hurst      DME agency  [facility]     HH arranged  HH-2 PT      [facility] agency  [facility]   Status of service:  Completed, signed off Medicare Important Message given?  NO (If response is "NO", the following Medicare IM given date fields will be blank) Date Medicare IM given:   Date Additional Medicare IM given:    Discharge Disposition:  HOME W HOME HEALTH SERVICES  Per UR Regulation:  Reviewed for med. necessity/level of care/duration of stay  If discussed at Long Length of Stay Meetings, dates discussed:    Comments:  08/15/11 11:43  Anette Guarneri RN/CM Spoke with patient regarding d/c needs. Patient choice for [facility] services and DME is [facility] Contacted [facility] for HHPT to be started after d/c RW to be delivered to room prior to d/c  MEDICARE-CERTIFED HOME HEALTH AGENCIES [facility]   Agencies that are Medicare-Certified and affiliated with [facility]  Home Health Agency  Telephone Number Address  [facility]    [facility] has ownership interest  in this company; however, you are under no obligation to use this agency. [phone]  [address] [place], [place] [address]   Agencies that are Medicare-Certified and are not affiliated with [facility]  Telephone Number Address  [facility] [phone] Fax [phone] [address] [place], [place]  [address]  [facility] [phone] [address] [place], [place]  [address]  [facility] [phone] Fax [phone] [address] [place], [place]  [address]  [facility] [phone] Fax [phone] [address] [place], [place] [address]  [facility] [phone] or  [phone] [address] [place], [place] [address]  [facility] [phone] Fax [phone] [address] [place], [place] [address]  [facility] [phone]  [address] [place], [place] [address]  [facility] [phone] or  [phone] [address] [place], [place] [address]   [facility]  [phone] [address] [place], [place]  [address]  [facility] [phone] Fax [phone] [address] [place], [place]  [address]

## 2011-08-15 NOTE — Progress Notes (Signed)
1045am. Discharge instructions and prescriptions reviewed by patient. Patient states she understands instructions. Patient education handouts given on Sciatica, Spinal fusion home care of incision site, and constipation. Patient able to respond positively to the teaching. Raunaq Austynn Pridmore, SN [facility]

## 2011-08-15 NOTE — Progress Notes (Signed)
Physical Therapy Treatment Note   08/15/11 0954  PT Visit Information  Last PT Received On 08/15/11  Assistance Needed +1  PT Time Calculation  PT Start Time 0954  PT Stop Time 1016  PT Time Calculation (min) 22 min  Subjective Data  Subjective Pt received supine in bed with report "i"m leaving today"  Precautions  Precautions Back  Required Braces or Orthoses Spinal Brace  Spinal Brace Lumbar corset  Restrictions  Weight Bearing Restrictions No  Cognition  Overall Cognitive Status Appears within functional limits for tasks assessed/performed  Cognition - Other Comments pt reports "I"m woozy from the pain medicine."  Bed Mobility  Bed Mobility Rolling Left;Left Sidelying to Sit  Rolling Left 6: Modified independent (Device/Increase time);With rail  Left Sidelying to Sit 3: Mod assist;With rails;HOB flat  Sitting - Scoot to Edge of Bed 4: Min assist  Details for Bed Mobility Assistance pt with strong use of rail despite max v/c's to not use it due to patient not having hospital bed at home. pt use daughter to assist  Transfers  Transfers Sit to Stand;Stand to Sit  Sit to Stand 4: Min assist;With upper extremity assist;From chair/3-in-1  Stand to Sit 4: Min assist;With upper extremity assist;To chair/3-in-1  Details for Transfer Assistance v/c's for safe hand placement however pt still places bilat UEs on walker  Ambulation/Gait  Ambulation/Gait Assistance 4: Min guard  Ambulation Distance (Feet) 100 Feet  Assistive device Rolling walker  Ambulation/Gait Assistance Details labored effort, increased bilat UE support  Gait Pattern Step-through pattern;Decreased stride length;Antalgic  Gait velocity decreased  Stairs Yes  Stairs Assistance 1: +2 Total assist  Stairs Assistance Details (indicate cue type and reason) 50  Stair Management Technique (bilat HHA due to no handrail at home)  Number of Stairs 3   PT - End of Session  Equipment Utilized During Treatment Gait belt;Back  brace  Activity Tolerance Patient limited by fatigue;Patient limited by pain  Patient left in chair;with family/visitor present  PT - Assessment/Plan  Comments on Treatment Session Patient requires assist for all mobilty and reports children, mother, and husband will be available to assist her 24/7. Although labored effort patient able to safely enter home. daughter present and return demonstrated good technique with assisting mother on stairs and with donning of lumbar corset. Patient safe to d/c home when approved by MD.  PT Plan Discharge plan remains appropriate;Frequency remains appropriate  PT Frequency Min 5X/week  Follow Up Recommendations Home health PT;Supervision/Assistance - 24 hour  Equipment Recommended Rolling walker with 5" wheels;3 in 1 bedside comode  Acute Rehab PT Goals  PT Goal: Rolling Supine to Right Side - Progress Progressing toward goal  PT Goal: Rolling Supine to Left Side - Progress Progressing toward goal  PT Goal: Supine/Side to Sit - Progress Progressing toward goal  PT Goal: Sit to Stand - Progress Progressing toward goal  PT Goal: Stand to Sit - Progress Progressing toward goal  PT Goal: Ambulate - Progress Progressing toward goal  PT Goal: Up/Down Stairs - Progress Progressing toward goal  PT General Charges  $$ ACUTE PT VISIT 1 Procedure  PT Treatments  $Gait Training 8-22 mins     Pain: initially 1/10, 8/10 post PT session  Lewis Shock, PT, DPT Pager #: [phone] Office #: [phone]

## 2012-05-30 ENCOUNTER — Encounter: Payer: 59 | Admitting: Neurology

## 2012-06-05 ENCOUNTER — Encounter: Payer: Self-pay | Admitting: Neurology

## 2012-06-05 ENCOUNTER — Other Ambulatory Visit: Payer: Self-pay | Admitting: *Deleted

## 2012-06-05 ENCOUNTER — Ambulatory Visit (INDEPENDENT_AMBULATORY_CARE_PROVIDER_SITE_OTHER): Payer: 59 | Admitting: Neurology

## 2012-06-05 ENCOUNTER — Ambulatory Visit: Payer: 59 | Admitting: Neurology

## 2012-06-05 VITALS — BP 162/116 | HR 87 | Temp 98.2°F | Ht 63.0 in | Wt 243.0 lb

## 2012-06-05 DIAGNOSIS — R202 Paresthesia of skin: Secondary | ICD-10-CM

## 2012-06-05 DIAGNOSIS — R209 Unspecified disturbances of skin sensation: Secondary | ICD-10-CM

## 2012-06-05 DIAGNOSIS — R200 Anesthesia of skin: Secondary | ICD-10-CM

## 2012-06-05 NOTE — Patient Instructions (Addendum)
I have some suggestions for you today:   Remember to drink plenty of fluid, eat healthy meals and do not skip any meals. Try to eat protein with a every meal and eat a healthy snack such as fruit or nuts in between meals. Try to keep a regular sleep-wake schedule and try to exercise daily, particularly in the form of walking, 20-30 minutes a day, if you can. Try to lose wt.  I will do MRI L spine, blood work, EMG and nerve conduction test on L leg. Call us with dose of gabapentin, we may be able to increase that for symptomatic relief.  I would like to see you back in 3 months, sooner if we need to. Please call us with any interim questions, concerns, problems, updates or refill requests.  Please also call us for any test results so we can go over those with you on the phone. Brett Canales is my clinical assistant and will answer any of your questions and relay your messages to me and also relay most of my messages to you.  Our phone number is [phone]. We also have an after hours call service for urgent matters and there is a physician on-call for urgent questions. For any emergencies you know to call 911 or go to the nearest emergency room.

## 2012-06-05 NOTE — Progress Notes (Signed)
Subjective:    Patient ID: Jasmin Gordon is a 42 y.o. female.  HPI  Jasmin Foley, Jasmin Gordon, Jasmin Gordon [facility] [address] [address] [place], [place] [address]   Dear Dr. Jeanie Sewer,   I saw your patient, Jasmin Gordon, upon your kind request, in neurologic clinic today for initial consultation of left foot numbness. The patient is unaccompanied today. As you know, Jasmin Gordon is a very pleasant 42 year old right-handed woman with an underlying medical history of hypertension and obesity who has been experiencing left foot numbness for the past 10 months approximately. She reports symptoms started approximately one month after her back surgery. She has no significant residual lower back pain. She had low back surgery in July 2013. Her current medications include hydrochlorothiazide and gabapentin tid. She does not know the dose.  She reports a pins and needles like sensation and numbness, engulfing the lateral L foot and both distal legs from the knees on down are mildly tingling. No weakness, such as foot drop or tripping. She did fall on 09/10/11 and 11/26/11. Both times her ankle twisted as she did not feel where she was stepping.  She denies any sudden onset one-sided weakness, numbness, tingling, slurring of speech, swallowing difficulties, tinnitus, hearing loss, diplopia or facial droop. She has no significant recurrent headaches. She denies much in the way of weakness or paresthesias. No residual back pain, no B/B dysfunction, no radiating pain.  Her Past Medical History Is Significant For: Past Medical History  Diagnosis Date  . Herniated disc   . Obese     Her Past Surgical History Is Significant For: Past Surgical History  Procedure Laterality Date  . Hysterectomy surgery    . Lower back surgery  2013,2007    disc   . Cesarean section      x 4    Her Family History Is Significant For: Family History  Problem Relation Age of Onset  . High blood  pressure Maternal Grandmother   . Diabetes Paternal Grandmother     Her Social History Is Significant For: History   Social History  . Marital Status: Married    Spouse Name: N/A    Number of Children: 5  . Years of Education: college   Occupational History  . store Production designer, theatre/television/film    Social History Main Topics  . Smoking status: Never Smoker   . Smokeless tobacco: None  . Alcohol Use: Yes     Comment: Patient drinks 1-2 drinks a year.  . Drug Use: No  . Sexually Active: None   Other Topics Concern  . None   Social History Narrative  . None    Her Allergies Are:  No Known Allergies:   Her Current Medications Are:  Outpatient Encounter Prescriptions as of 06/05/2012  Medication Sig Dispense Refill  . gabapentin (NEURONTIN) 300 MG capsule       . hydrochlorothiazide (HYDRODIURIL) 12.5 MG tablet        No facility-administered encounter medications on file as of 06/05/2012.  : Review of Systems  HENT:       Restless leg  Musculoskeletal:       Cramps  Allergic/Immunologic: Positive for environmental allergies.  Neurological: Positive for weakness and numbness.    Objective:  Neurologic Exam  Physical Exam Physical Examination:   Filed Vitals:   06/05/12 0916  BP: 162/116  Pulse: 87  Temp: 98.2 F (36.8 C)    General Examination: The patient is a very pleasant 41  y.o. female in no acute distress. She appears well-developed and well-nourished and well groomed. She is morbidly obese.  HEENT: Normocephalic, atraumatic, pupils are equal, round and reactive to light and accommodation. Funduscopic exam is normal with sharp disc margins noted. Extraocular tracking is good without limitation to gaze excursion or nystagmus noted. Normal smooth pursuit is noted. Hearing is grossly intact. Tympanic membranes are clear bilaterally. Face is symmetric with normal facial animation and normal facial sensation. Speech is clear with no dysarthria noted. There is no hypophonia. There  is no lip, neck or jaw tremor. Neck is supple with full range of motion. There are no carotid bruits on auscultation. Oropharynx exam reveals: poor dental hygiene and moderate airway crowding, due to narrow airway entry and redundant soft palate. Mallampati is class II. Neck size is 16.5 inches. Tongue protrudes centrally and palate elevates symmetrically. Tonsils are 1+.   Chest: Clear to auscultation without wheezing, rhonchi or crackles noted.  Heart: S1+S2+0, regular and normal without murmurs, rubs or gallops noted.   Abdomen: Soft, non-tender and non-distended with normal bowel sounds appreciated on auscultation.  Extremities: There is no pitting edema in the distal lower extremities bilaterally. Pedal pulses are intact.  Skin: Warm and dry without trophic changes noted. There are no varicose veins.  Musculoskeletal: exam reveals no obvious joint deformities, tenderness or joint swelling or erythema.   Neurologically:  Mental status: The patient is awake, alert and oriented in all 4 spheres. Her memory, attention, language and knowledge are appropriate. There is no aphasia, agnosia, apraxia or anomia. Speech is clear with normal prosody and enunciation. Thought process is linear. Mood is congruent and affect is normal.  Cranial nerves are as described above under HEENT exam. In addition, shoulder shrug is normal with equal shoulder height noted. Motor exam: Normal bulk, strength and tone is noted, no foot drop. There is no drift, tremor or rebound. Romberg is negative. Reflexes are 2+ in the UEs and 1+ in the LEs. Toes are downgoing bilaterally. Fine motor skills are intact with normal finger taps, normal hand movements, normal rapid alternating patting, normal foot taps and normal foot agility.  Cerebellar testing shows no dysmetria or intention tremor on finger to nose testing. Heel to shin is unremarkable bilaterally. There is no truncal or gait ataxia.  Sensory exam is intact to light  touch, pinprick, vibration, temperature sense and proprioception in the upper extremities and the RLE. In the LLE there is decrease in PP, temp, vibration with allodynia or increase in paresthesia in the lateral aspect of the L foot.  Gait, station and balance are unremarkable. No veering to one side is noted. No leaning to one side is noted. Posture is age-appropriate and stance is narrow based. No problems turning are noted. She turns en bloc. Tandem walk is unremarkable. Intact toe and heel stance is noted. No tenderness over spine on palpation.              Assessment and Plan:  Assessment and Plan:  In summary, Jasmin Gordon is a very pleasant 42 y.o.-year old female with a history of L foot numbness and tingling since 8/13 with Hx of back surgery in 7/13. Her physical exam is pertinent for: Obesity, a narrow airway and enlarged neck, numbness and paresthesias in the lateral aspect of the L foot with normal strength and reflexes. I reassured the patient in that regard.  I had a long chat with the patient about my findings and suggested the following: MRI  lumbar spine with contrast, EMG and nerve conduction tests of left lower extremity, blood work to include CK, aldolase, ESR, electrolytes, thyroid function, B12 and folate. I encouraged the patient to eat healthy, exercise daily and keep well hydrated, to try to lose wt.  As far as medications are concerned, I recommended the following at this time: I would like for her to call me back with the dose of her gabapentin. We may be able to increase his for symptomatic relief. I have discouraged her from repetitive use of Advil, especially since it does not really help.  I answered all her questions today and the patient was in agreement with the above outlined plan. I would like to see the patient back in 3 months, sooner if the need arises and encouraged her to call with any interim questions, concerns, problems or updates.   Thank you very much for  allowing me to participate in the care of this nice patient. If I can be of any further assistance to you please do not hesitate to call me at [phone].  Sincerely,   Jasmin Foley, Jasmin Gordon, Jasmin Gordon

## 2012-06-06 LAB — CK TOTAL AND CKMB (NOT AT ARMC): Total CK: 97 U/L (ref 24–173)

## 2012-06-06 LAB — CBC WITH DIFFERENTIAL
Basophils Absolute: 0 10*3/uL (ref 0.0–0.2)
Eosinophils Absolute: 0.7 10*3/uL — ABNORMAL HIGH (ref 0.0–0.4)
HCT: 43.6 % (ref 34.0–46.6)
Lymphocytes Absolute: 3.5 10*3/uL — ABNORMAL HIGH (ref 0.7–3.1)
MCH: 28.8 pg (ref 26.6–33.0)
MCHC: 34.2 g/dL (ref 31.5–35.7)
MCV: 84 fL (ref 79–97)
Monocytes: 5 % (ref 4–12)
Neutrophils Absolute: 7.3 10*3/uL — ABNORMAL HIGH (ref 1.4–7.0)
RDW: 14 % (ref 12.3–15.4)

## 2012-06-06 LAB — COMPREHENSIVE METABOLIC PANEL
AST: 24 IU/L (ref 0–40)
Albumin/Globulin Ratio: 1.4 (ref 1.1–2.5)
Alkaline Phosphatase: 80 IU/L (ref 39–117)
BUN/Creatinine Ratio: 10 (ref 9–23)
Creatinine, Ser: 0.67 mg/dL (ref 0.57–1.00)
GFR calc non Af Amer: 110 mL/min/{1.73_m2} (ref >59)
Globulin, Total: 3 g/dL (ref 1.5–4.5)
Sodium: 139 mmol/L (ref 134–144)
Total Bilirubin: 0.3 mg/dL (ref 0.0–1.2)

## 2012-06-06 NOTE — Progress Notes (Signed)
Quick Note:  Please call and advise the patient that the recent labs we checked were within normal limits. We checked vitamin B12 level, cell count, blood sugar, electrolytes, kidney function, liver function, thyroid function, inflammatory markers, muscle enzymes. There is only 2 things that showed up which is a B12 level that is on the lower end of the spectrum so it may not be a bad idea to take them vitamin B complex over-the-counter and her white count was mildly elevated, which may be indicating a infection in her body but is nonspecific. Any infection can do this including an upper respiratory infection or urinary tract infection. When I look back in her records she has had increased white count values in the past as well. This may be normal for her. Please remind patient to keep any upcoming appointments and to call us with any interim questions, concerns, problems or updates. Thanks,  Huston Foley, MD, PhD    ______

## 2012-06-07 ENCOUNTER — Encounter: Payer: 59 | Admitting: Neurology

## 2012-06-07 NOTE — Progress Notes (Signed)
Quick Note:  Spoke with patient and relayed results of blood work. Patient understood and had no questions. She is not able to keep her EMG appointment today because she was unaware of it until I mentioned it. She asked that her EMG be rescheduled for next Thursday (06-14-12) and that somebody call her with the time.  ______

## 2012-06-18 ENCOUNTER — Encounter (INDEPENDENT_AMBULATORY_CARE_PROVIDER_SITE_OTHER): Payer: 59

## 2012-06-18 ENCOUNTER — Ambulatory Visit (INDEPENDENT_AMBULATORY_CARE_PROVIDER_SITE_OTHER): Payer: 59 | Admitting: Neurology

## 2012-06-18 DIAGNOSIS — G544 Lumbosacral root disorders, not elsewhere classified: Secondary | ICD-10-CM

## 2012-06-18 DIAGNOSIS — R202 Paresthesia of skin: Secondary | ICD-10-CM

## 2012-06-18 DIAGNOSIS — R200 Anesthesia of skin: Secondary | ICD-10-CM

## 2012-06-18 DIAGNOSIS — Z0289 Encounter for other administrative examinations: Secondary | ICD-10-CM

## 2012-06-18 NOTE — Procedures (Signed)
  HISTORY:  Jasmin Gordon is a 42 year old patient with lumbosacral spine surgery in July of 2013. She has reported numbness in the outside of the left foot since that time.  NERVE CONDUCTION STUDIES:  Nerve conduction studies were performed on both lower extremities. The distal motor latencies and motor amplitudes for the peroneal and posterior tibial nerves were within normal limits. The nerve conduction velocities for these nerves were also normal. The H reflex latencies were normal. The sensory latencies for the peroneal nerves were within normal limits.    EMG STUDIES:  EMG study was performed on the left lower extremity:  The tibialis anterior muscle reveals 2 to 4K motor units with full recruitment. No fibrillations or positive waves were seen. The peroneus tertius muscle reveals 2 to 4K motor units with full recruitment. No fibrillations or positive waves were seen. The medial gastrocnemius muscle reveals 1 to 3K motor units with full recruitment. 2 plus fibrillations and positive waves were seen. The vastus lateralis muscle reveals 2 to 4K motor units with full recruitment. No fibrillations or positive waves were seen. The iliopsoas muscle reveals 2 to 4K motor units with full recruitment. No fibrillations or positive waves were seen. The biceps femoris muscle (long head) reveals 2 to 4K motor units with decreased recruitment. 3 plus fibrillations and positive waves were seen. The lumbosacral paraspinal muscles were tested at 3 levels, and revealed 2 plus fibrillations and positive waves at the upper level, and 3 plus at the middle level and 4 plus fibrillations and positive waves at the lower level.. There was good relaxation.    IMPRESSION:  Nerve conduction studies of the legs were normal. No evidence of a peripheral neuropathy is seen. EMG of the left leg is notable for an acute left S1 radiculopathy.  Marlan Palau MD 06/18/2012 10:59 AM  [facility] [address] [place], [place] [address]  Phone [phone] Fax [phone]

## 2012-06-18 NOTE — Progress Notes (Signed)
Quick Note:  Please call and advise the patient that the recent EMG and nerve conduction velocity test, which is the electrical nerve and muscle test we we performed, was reported as within normal limits as far as the nerve part goes. The muscle part, indicates a reaction of the muscles do to I nerve root problem in the back. This may be coming from the very low back and I do believe we should look at the lower back with the MRI. Please ask her if she has been scheduled just for that test. No further action is required on this EMG test at this time. Please remind patient to keep any upcoming appointments or tests and to call us with any interim questions, concerns, problems or updates. Thanks,  Huston Foley, MD, PhD   ______

## 2012-06-26 NOTE — Progress Notes (Signed)
Quick Note:  Spoke with patient and relayed results of EMG. Patient understood and had no questions. She is scheduled for a MRI lumbar on Wednesday (06-28-12). ______

## 2012-06-27 ENCOUNTER — Ambulatory Visit (INDEPENDENT_AMBULATORY_CARE_PROVIDER_SITE_OTHER): Payer: 59

## 2012-06-27 ENCOUNTER — Other Ambulatory Visit: Payer: Self-pay | Admitting: Neurology

## 2012-06-27 DIAGNOSIS — R202 Paresthesia of skin: Secondary | ICD-10-CM

## 2012-06-27 DIAGNOSIS — R200 Anesthesia of skin: Secondary | ICD-10-CM

## 2012-06-27 DIAGNOSIS — R209 Unspecified disturbances of skin sensation: Secondary | ICD-10-CM

## 2012-06-29 MED ORDER — GADOPENTETATE DIMEGLUMINE 469.01 MG/ML IV SOLN: 20.0000 mL | Freq: Once | INTRAVENOUS | Status: AC | PRN

## 2012-07-03 ENCOUNTER — Telehealth: Payer: Self-pay | Admitting: Neurology

## 2012-07-03 NOTE — Telephone Encounter (Signed)
Patient is calling to tell us she would like to know the results of her EMG.  She's having pain in her lower back and both legs, L >R.  The patient is requesting a new medication which can help her with the pain.

## 2012-07-04 NOTE — Telephone Encounter (Signed)
Called patient and and relayed results.

## 2015-06-04 DIAGNOSIS — J209 Acute bronchitis, unspecified: Secondary | ICD-10-CM | POA: Diagnosis not present

## 2016-03-06 DIAGNOSIS — R079 Chest pain, unspecified: Secondary | ICD-10-CM | POA: Diagnosis not present

## 2016-03-06 DIAGNOSIS — I10 Essential (primary) hypertension: Secondary | ICD-10-CM | POA: Diagnosis not present

## 2017-02-17 DIAGNOSIS — R631 Polydipsia: Secondary | ICD-10-CM | POA: Diagnosis not present

## 2017-02-17 DIAGNOSIS — M6281 Muscle weakness (generalized): Secondary | ICD-10-CM | POA: Diagnosis not present

## 2017-02-17 DIAGNOSIS — E119 Type 2 diabetes mellitus without complications: Secondary | ICD-10-CM | POA: Diagnosis not present

## 2017-02-20 DIAGNOSIS — Z6841 Body Mass Index (BMI) 40.0 and over, adult: Secondary | ICD-10-CM | POA: Diagnosis not present

## 2017-02-20 DIAGNOSIS — E1165 Type 2 diabetes mellitus with hyperglycemia: Secondary | ICD-10-CM | POA: Diagnosis not present

## 2017-02-21 DIAGNOSIS — E1165 Type 2 diabetes mellitus with hyperglycemia: Secondary | ICD-10-CM | POA: Diagnosis not present

## 2017-03-06 DIAGNOSIS — F419 Anxiety disorder, unspecified: Secondary | ICD-10-CM | POA: Diagnosis not present

## 2017-03-06 DIAGNOSIS — Z1339 Encounter for screening examination for other mental health and behavioral disorders: Secondary | ICD-10-CM | POA: Diagnosis not present

## 2017-03-06 DIAGNOSIS — F329 Major depressive disorder, single episode, unspecified: Secondary | ICD-10-CM | POA: Diagnosis not present

## 2017-03-06 DIAGNOSIS — E1165 Type 2 diabetes mellitus with hyperglycemia: Secondary | ICD-10-CM | POA: Diagnosis not present

## 2017-03-17 DIAGNOSIS — G4733 Obstructive sleep apnea (adult) (pediatric): Secondary | ICD-10-CM | POA: Diagnosis not present

## 2017-03-20 DIAGNOSIS — E1165 Type 2 diabetes mellitus with hyperglycemia: Secondary | ICD-10-CM | POA: Diagnosis not present

## 2017-03-20 DIAGNOSIS — F419 Anxiety disorder, unspecified: Secondary | ICD-10-CM | POA: Diagnosis not present

## 2017-03-20 DIAGNOSIS — G4710 Hypersomnia, unspecified: Secondary | ICD-10-CM | POA: Diagnosis not present

## 2017-03-20 DIAGNOSIS — Z6841 Body Mass Index (BMI) 40.0 and over, adult: Secondary | ICD-10-CM | POA: Diagnosis not present

## 2017-04-17 DIAGNOSIS — E1165 Type 2 diabetes mellitus with hyperglycemia: Secondary | ICD-10-CM | POA: Diagnosis not present

## 2017-04-17 DIAGNOSIS — K219 Gastro-esophageal reflux disease without esophagitis: Secondary | ICD-10-CM | POA: Diagnosis not present

## 2017-04-17 DIAGNOSIS — R34 Anuria and oliguria: Secondary | ICD-10-CM | POA: Diagnosis not present

## 2017-04-17 DIAGNOSIS — N3001 Acute cystitis with hematuria: Secondary | ICD-10-CM | POA: Diagnosis not present

## 2017-04-17 DIAGNOSIS — G4733 Obstructive sleep apnea (adult) (pediatric): Secondary | ICD-10-CM | POA: Diagnosis not present

## 2017-05-09 DIAGNOSIS — Z01419 Encounter for gynecological examination (general) (routine) without abnormal findings: Secondary | ICD-10-CM | POA: Diagnosis not present

## 2017-05-09 DIAGNOSIS — I10 Essential (primary) hypertension: Secondary | ICD-10-CM | POA: Diagnosis not present

## 2017-05-10 DIAGNOSIS — I10 Essential (primary) hypertension: Secondary | ICD-10-CM | POA: Diagnosis not present

## 2017-05-24 DIAGNOSIS — R0982 Postnasal drip: Secondary | ICD-10-CM | POA: Diagnosis not present

## 2017-05-24 DIAGNOSIS — Z6841 Body Mass Index (BMI) 40.0 and over, adult: Secondary | ICD-10-CM | POA: Diagnosis not present

## 2017-05-24 DIAGNOSIS — J309 Allergic rhinitis, unspecified: Secondary | ICD-10-CM | POA: Diagnosis not present

## 2017-05-24 DIAGNOSIS — Z1211 Encounter for screening for malignant neoplasm of colon: Secondary | ICD-10-CM | POA: Diagnosis not present

## 2017-05-24 DIAGNOSIS — I10 Essential (primary) hypertension: Secondary | ICD-10-CM | POA: Diagnosis not present

## 2017-06-05 DIAGNOSIS — S8011XA Contusion of right lower leg, initial encounter: Secondary | ICD-10-CM | POA: Diagnosis not present

## 2017-06-28 DIAGNOSIS — H6983 Other specified disorders of Eustachian tube, bilateral: Secondary | ICD-10-CM | POA: Diagnosis not present

## 2017-06-28 DIAGNOSIS — J309 Allergic rhinitis, unspecified: Secondary | ICD-10-CM | POA: Diagnosis not present

## 2017-06-28 DIAGNOSIS — F5089 Other specified eating disorder: Secondary | ICD-10-CM | POA: Diagnosis not present

## 2017-06-28 DIAGNOSIS — R05 Cough: Secondary | ICD-10-CM | POA: Diagnosis not present

## 2017-07-28 DIAGNOSIS — R079 Chest pain, unspecified: Secondary | ICD-10-CM | POA: Diagnosis not present

## 2017-07-28 DIAGNOSIS — R0789 Other chest pain: Secondary | ICD-10-CM | POA: Diagnosis not present

## 2017-07-28 DIAGNOSIS — I10 Essential (primary) hypertension: Secondary | ICD-10-CM | POA: Diagnosis not present

## 2017-07-28 DIAGNOSIS — K219 Gastro-esophageal reflux disease without esophagitis: Secondary | ICD-10-CM | POA: Diagnosis not present

## 2017-08-10 DIAGNOSIS — R0789 Other chest pain: Secondary | ICD-10-CM | POA: Diagnosis not present

## 2017-08-10 DIAGNOSIS — Z1339 Encounter for screening examination for other mental health and behavioral disorders: Secondary | ICD-10-CM | POA: Diagnosis not present

## 2017-08-10 DIAGNOSIS — I208 Other forms of angina pectoris: Secondary | ICD-10-CM | POA: Diagnosis not present

## 2017-08-10 DIAGNOSIS — Z6841 Body Mass Index (BMI) 40.0 and over, adult: Secondary | ICD-10-CM | POA: Diagnosis not present

## 2017-08-11 ENCOUNTER — Encounter: Payer: Self-pay | Admitting: Cardiology

## 2017-08-11 ENCOUNTER — Ambulatory Visit: Payer: Self-pay | Admitting: Cardiology

## 2017-08-11 DIAGNOSIS — E669 Obesity, unspecified: Secondary | ICD-10-CM | POA: Insufficient documentation

## 2017-08-11 DIAGNOSIS — N3000 Acute cystitis without hematuria: Secondary | ICD-10-CM | POA: Insufficient documentation

## 2017-08-11 DIAGNOSIS — R05 Cough: Secondary | ICD-10-CM | POA: Insufficient documentation

## 2017-08-11 DIAGNOSIS — R0609 Other forms of dyspnea: Secondary | ICD-10-CM | POA: Insufficient documentation

## 2017-08-11 DIAGNOSIS — R109 Unspecified abdominal pain: Secondary | ICD-10-CM | POA: Insufficient documentation

## 2017-08-11 DIAGNOSIS — G4730 Sleep apnea, unspecified: Secondary | ICD-10-CM | POA: Insufficient documentation

## 2017-08-11 DIAGNOSIS — M5430 Sciatica, unspecified side: Secondary | ICD-10-CM

## 2017-08-11 DIAGNOSIS — R5381 Other malaise: Secondary | ICD-10-CM

## 2017-08-11 DIAGNOSIS — R202 Paresthesia of skin: Secondary | ICD-10-CM | POA: Insufficient documentation

## 2017-08-11 DIAGNOSIS — K219 Gastro-esophageal reflux disease without esophagitis: Secondary | ICD-10-CM

## 2017-08-11 DIAGNOSIS — R200 Anesthesia of skin: Secondary | ICD-10-CM

## 2017-08-11 DIAGNOSIS — R079 Chest pain, unspecified: Secondary | ICD-10-CM | POA: Insufficient documentation

## 2017-08-11 DIAGNOSIS — R059 Cough, unspecified: Secondary | ICD-10-CM | POA: Insufficient documentation

## 2017-08-11 DIAGNOSIS — R490 Dysphonia: Secondary | ICD-10-CM | POA: Insufficient documentation

## 2017-08-11 DIAGNOSIS — R5383 Other fatigue: Secondary | ICD-10-CM

## 2017-08-11 DIAGNOSIS — E119 Type 2 diabetes mellitus without complications: Secondary | ICD-10-CM | POA: Insufficient documentation

## 2017-08-11 DIAGNOSIS — F419 Anxiety disorder, unspecified: Secondary | ICD-10-CM

## 2017-08-11 DIAGNOSIS — R0600 Dyspnea, unspecified: Secondary | ICD-10-CM | POA: Insufficient documentation

## 2017-08-11 DIAGNOSIS — F329 Major depressive disorder, single episode, unspecified: Secondary | ICD-10-CM | POA: Insufficient documentation

## 2017-08-11 DIAGNOSIS — R10A2 Flank pain, left side: Secondary | ICD-10-CM

## 2017-08-11 DIAGNOSIS — R34 Anuria and oliguria: Secondary | ICD-10-CM

## 2017-08-11 DIAGNOSIS — F32A Depression, unspecified: Secondary | ICD-10-CM | POA: Insufficient documentation

## 2017-08-11 HISTORY — DX: Anuria and oliguria: R34

## 2017-08-11 HISTORY — DX: Flank pain, left side: R10.A2

## 2017-08-11 HISTORY — DX: Anxiety disorder, unspecified: F41.9

## 2017-08-11 HISTORY — DX: Anesthesia of skin: R20.0

## 2017-08-11 HISTORY — DX: Gastro-esophageal reflux disease without esophagitis: K21.9

## 2017-08-11 HISTORY — DX: Chest pain, unspecified: R07.9

## 2017-08-11 HISTORY — DX: Paresthesia of skin: R20.2

## 2017-08-11 HISTORY — DX: Other malaise: R53.81

## 2017-08-11 HISTORY — DX: Sciatica, unspecified side: M54.30

## 2017-08-11 HISTORY — DX: Dysphonia: R49.0

## 2017-08-11 HISTORY — DX: Cough, unspecified: R05.9

## 2017-08-11 HISTORY — DX: Acute cystitis without hematuria: N30.00

## 2017-08-11 HISTORY — DX: Other forms of dyspnea: R06.09

## 2017-08-16 ENCOUNTER — Encounter: Payer: Self-pay | Admitting: Cardiology

## 2017-08-16 ENCOUNTER — Ambulatory Visit (INDEPENDENT_AMBULATORY_CARE_PROVIDER_SITE_OTHER): Payer: BLUE CROSS/BLUE SHIELD | Admitting: Cardiology

## 2017-08-16 ENCOUNTER — Encounter (INDEPENDENT_AMBULATORY_CARE_PROVIDER_SITE_OTHER): Payer: Self-pay

## 2017-08-16 VITALS — BP 150/110 | HR 99 | Ht 63.0 in | Wt 249.0 lb

## 2017-08-16 DIAGNOSIS — F32A Depression, unspecified: Secondary | ICD-10-CM

## 2017-08-16 DIAGNOSIS — R05 Cough: Secondary | ICD-10-CM

## 2017-08-16 DIAGNOSIS — R34 Anuria and oliguria: Secondary | ICD-10-CM

## 2017-08-16 DIAGNOSIS — R200 Anesthesia of skin: Secondary | ICD-10-CM

## 2017-08-16 DIAGNOSIS — F329 Major depressive disorder, single episode, unspecified: Secondary | ICD-10-CM

## 2017-08-16 DIAGNOSIS — E782 Mixed hyperlipidemia: Secondary | ICD-10-CM

## 2017-08-16 DIAGNOSIS — R5383 Other fatigue: Secondary | ICD-10-CM

## 2017-08-16 DIAGNOSIS — R0789 Other chest pain: Secondary | ICD-10-CM

## 2017-08-16 DIAGNOSIS — R10A2 Flank pain, left side: Secondary | ICD-10-CM

## 2017-08-16 DIAGNOSIS — R5381 Other malaise: Secondary | ICD-10-CM | POA: Diagnosis not present

## 2017-08-16 DIAGNOSIS — R0609 Other forms of dyspnea: Secondary | ICD-10-CM

## 2017-08-16 DIAGNOSIS — R059 Cough, unspecified: Secondary | ICD-10-CM

## 2017-08-16 DIAGNOSIS — R202 Paresthesia of skin: Secondary | ICD-10-CM

## 2017-08-16 DIAGNOSIS — R490 Dysphonia: Secondary | ICD-10-CM

## 2017-08-16 DIAGNOSIS — G4730 Sleep apnea, unspecified: Secondary | ICD-10-CM | POA: Diagnosis not present

## 2017-08-16 DIAGNOSIS — F419 Anxiety disorder, unspecified: Secondary | ICD-10-CM

## 2017-08-16 DIAGNOSIS — K219 Gastro-esophageal reflux disease without esophagitis: Secondary | ICD-10-CM | POA: Diagnosis not present

## 2017-08-16 DIAGNOSIS — R109 Unspecified abdominal pain: Secondary | ICD-10-CM

## 2017-08-16 DIAGNOSIS — R079 Chest pain, unspecified: Secondary | ICD-10-CM

## 2017-08-16 DIAGNOSIS — I10 Essential (primary) hypertension: Secondary | ICD-10-CM

## 2017-08-16 DIAGNOSIS — N3001 Acute cystitis with hematuria: Secondary | ICD-10-CM

## 2017-08-16 DIAGNOSIS — R0600 Dyspnea, unspecified: Secondary | ICD-10-CM

## 2017-08-16 DIAGNOSIS — M5430 Sciatica, unspecified side: Secondary | ICD-10-CM

## 2017-08-16 NOTE — Progress Notes (Signed)
Cardiology Office Note:    Date:  08/16/2017   ID:  Jasmin Gordon, DOB 10/27/1970, MRN [id number]  PCP:  Jasmin MaltaBurgart, [name], MD  Cardiologist:  Jasmin Brothersajan R Daaiel Starlin, MD   Referring MD: Jasmin MaltaBurgart, [name], MD    ASSESSMENT:    1. Chest pain, unspecified type   2. Sleep apnea, unspecified type   3. Gastroesophageal reflux disease, esophagitis presence not specified   4. Malaise and fatigue   5. Hoarseness   6. Oliguria   7. Paresthesia   8. Numbness of foot   9. Left flank pain   10. Dyspnea on exertion   11. Cough   12. Acute cystitis with hematuria   13. Sciatica, unspecified laterality   14. Anxiety and depression   15. Chest tightness   16. Essential hypertension   17. Mixed dyslipidemia    PLAN:    In order of problems listed above:  1. Primary prevention stressed with the patient.  Importance of compliance with diet and medications stressed and she vocalized understanding.  Blood pressure is stable.  Diet was discussed with dyslipidemia and weight reduction was advised.  This of obesity explained.  She has an element of whitecoat hypertension and she tells me her blood pressure is fine at home. 2. In view of her symptoms we will get an exercise stress Cardiolite.  She is undergone a hysterectomy in the past according to the history provided by her.  She knows to go to the nearest emergency room for any concerning symptoms. 3. Patient will be seen in follow-up appointment in 6 months or earlier if the patient has any concerns    Medication Adjustments/Labs and Tests Ordered: Current medicines are reviewed at length with the patient today.  Concerns regarding medicines are outlined above.  Orders Placed This Encounter  Procedures  . MYOCARDIAL PERFUSION IMAGING   No orders of the defined types were placed in this encounter.    History of Present Illness:    Jasmin SprayLydia M Gordon is a [age] y.o. female who is being seen today for the evaluation of chest tightness at the request  of Jasmin MaltaBurgart, [name], MD.  Patient is a pleasant 47 year old female.  She has past medical history of essential hypertension, dyslipidemia, obesity.  She leads a sedentary lifestyle.  She has chest tightness.  She is concerned about it.  She mentions to me that she does not exercise on a regular basis.  No chest tightness that does radiate to the neck or to the arm.  At the time of my evaluation, the patient is alert awake oriented and in no distress.  He  Past Medical History:  Diagnosis Date  . Constipation 08/14/2011  . Diabetes mellitus (HCC)   . GERD (gastroesophageal reflux disease) 08/11/2017  . Herniated disc   . HTN (hypertension) 08/09/2011  . Intractable low back pain 08/07/2011  . Obese   . Radiculopathy, lumbosacral region 08/08/2011  . Sleep apnea   . UTI (lower urinary tract infection) 08/08/2011    Past Surgical History:  Procedure Laterality Date  . CESAREAN SECTION     x 4  . hysterectomy surgery    . lower back surgery  2013,2007   disc     Current Medications: Current Meds  Medication Sig  . gabapentin (NEURONTIN) 300 MG capsule   . hydrochlorothiazide (HYDRODIURIL) 12.5 MG tablet   . levocetirizine (XYZAL) 5 MG tablet Take 1 tablet by mouth at bedtime.  Marland Kitchen. lisinopril (PRINIVIL,ZESTRIL) 10 MG tablet Take 1 tablet by  mouth daily.  . metFORMIN (GLUCOPHAGE-XR) 500 MG 24 hr tablet Take 1 tablet by mouth daily.  . VENTOLIN HFA 108 (90 Base) MCG/ACT inhaler Inhale 2 puffs into the lungs as needed.     Allergies:   Patient has no known allergies.   Social History   Socioeconomic History  . Marital status: Married    Spouse name: Not on file  . Number of children: 5  . Years of education: college  . Highest education level: Not on file  Occupational History  . Occupation: Company secretary: Secondary school teacher  Social Needs  . Financial resource strain: Not on file  . Food insecurity:    Worry: Not on file    Inability: Not on file  . Transportation  needs:    Medical: Not on file    Non-medical: Not on file  Tobacco Use  . Smoking status: Never Smoker  . Smokeless tobacco: Never Used  Substance and Sexual Activity  . Alcohol use: Yes    Comment: Patient drinks 1-2 drinks a year.  . Drug use: No  . Sexual activity: Not on file  Lifestyle  . Physical activity:    Days per week: Not on file    Minutes per session: Not on file  . Stress: Not on file  Relationships  . Social connections:    Talks on phone: Not on file    Gets together: Not on file    Attends religious service: Not on file    Active member of club or organization: Not on file    Attends meetings of clubs or organizations: Not on file    Relationship status: Not on file  Other Topics Concern  . Not on file  Social History Narrative  . Not on file     Family History: The patient's family history includes CAD in her mother; Diabetes in her mother and paternal grandmother; High blood pressure in her maternal grandmother.  ROS:   Please see the history of present illness.    All other systems reviewed and are negative.  EKGs/Labs/Other Studies Reviewed:    The following studies were reviewed today: EKG reveals sinus rhythm and nonspecific ST-T changes.   Recent Labs: No results found for requested labs within last 8760 hours.  Recent Lipid Panel No results found for: CHOL, TRIG, HDL, CHOLHDL, VLDL, LDLCALC, LDLDIRECT  Physical Exam:    VS:  BP (!) 150/110 (BP Location: Left Arm, Patient Position: Sitting, Cuff Size: Large)   Pulse 99   Ht 5\' 3"  (1.6 m)   Wt 249 lb (112.9 kg)   SpO2 97%   BMI 44.11 kg/m     Wt Readings from Last 3 Encounters:  08/16/17 249 lb (112.9 kg)  06/05/12 243 lb (110.2 kg)  08/07/11 238 lb (108 kg)     GEN: Patient is in no acute distress HEENT: Normal NECK: No JVD; No carotid bruits LYMPHATICS: No lymphadenopathy CARDIAC: S1 S2 regular, 2/6 systolic murmur at the apex. RESPIRATORY:  Clear to auscultation without  rales, wheezing or rhonchi  ABDOMEN: Soft, non-tender, non-distended MUSCULOSKELETAL:  No edema; No deformity  SKIN: Warm and dry NEUROLOGIC:  Alert and oriented x 3 PSYCHIATRIC:  Normal affect    Signed, Jasmin Brothers, MD  08/16/2017 11:35 AM    [facility]

## 2017-08-16 NOTE — Addendum Note (Signed)
Addended by: Craige CottaANDERSON, Adisyn Ruscitti S on: 08/16/2017 04:36 PM   Modules accepted: Orders

## 2017-08-16 NOTE — Patient Instructions (Signed)
Medication Instructions:  Your physician recommends that you continue on your current medications as directed. Please refer to the Current Medication list given to you today.  Labwork: None  Testing/Procedures: Your physician has requested that you have en exercise stress myoview. For further information please visit [email or web address]. Please follow instruction sheet, as given.  Follow-Up: Your physician recommends that you schedule a follow-up appointment in: 3 months  Any Other Special Instructions Will Be Listed Below (If Applicable).     If you need a refill on your cardiac medications before your next appointment, please call your pharmacy.   [facility]  Garey HamAshley A, RN, BSN    [facility] [address], [place], [place] [address] [phone]  Lexiscan testing instructions:  Please present to [facility] 15 minutes earlier than your appointment time to allow for registration.  You will be called with an appointment date and time by our office once it has been scheduled; please allow at least 48 hours for us to contact you.  No food or drink after midnight prior to your test (except for small sips of water with your medications).  Hold the following medications the morning of your test: hydrochlorothiazide and metformin  Bring a medication list or all your medications with you the morning of the testing.  No caffeine, decaffeinated or chocolate products 12 hours prior to the testing.  Please be aware that the test can take up to 3-4 hours. This is a 2 day test process and you will follow the same instructions for both days.   Should you have any problem with the appointment date or time, please call [phone].   Please call the office with any further questions or concerns.

## 2017-08-16 NOTE — Progress Notes (Signed)
Cardiology Office Note:    Date:  08/16/2017   ID:  Jasmin Gordon, DOB [birth year], MRN [id number]  PCP:  Jasmin Malta, MD  Cardiologist:  Jasmin Brothers, MD   Referring MD: Jasmin Malta, MD    ASSESSMENT:    1. Chest pain, unspecified type   2. Sleep apnea, unspecified type   3. Gastroesophageal reflux disease, esophagitis presence not specified   4. Malaise and fatigue   5. Hoarseness   6. Oliguria   7. Paresthesia   8. Numbness of foot   9. Left flank pain   10. Dyspnea on exertion   11. Cough   12. Acute cystitis with hematuria   13. Sciatica, unspecified laterality   14. Anxiety and depression   15. Chest tightness   16. Essential hypertension   17. Mixed dyslipidemia    PLAN:    In order of problems listed above:  1. Primary prevention stressed with the patient.  Importance of compliance with diet and medications stressed and she vocalized understanding.  Her blood pressure is stable.  She checks it regularly at home.  She has an element of whitecoat hypertension.  Diet was discussed for dyslipidemia and obesity.  Risks of obesity explained and she vocalized understanding.  Weight reduction was encouraged. 2. In view of her multiple risk factors and chest tightness I discussed with her about exercise nuclear stress testing and she is agreeable.  She also tells me that she is undergone hysterectomy in the past.  She knows to go to the nearest emergency room for any concerning symptoms. 3. Patient will be seen in follow-up appointment in 6 months or earlier if the patient has any concerns 4.    Medication Adjustments/Labs and Tests Ordered: Current medicines are reviewed at length with the patient today.  Concerns regarding medicines are outlined above.  Orders Placed This Encounter  Procedures  . MYOCARDIAL PERFUSION IMAGING   No orders of the defined types were placed in this encounter.    History of Present Illness:    Jasmin Gordon is a 47 y.o.  female who is being seen today for the evaluation of chest tightness patient is a pleasant 47 year old female.  She has past medical history of essential hypertension and dyslipidemia.  At the request of Jasmin Malta, MD.  She is overweight and leads a sedentary lifestyle.  She mentions to me that she has chest tightness at times.  It does not seem like this is related to exertion.  No orthopnea or PND.  This is been of concern to her and therefore she is here for evaluation.  At the time of my evaluation, the patient is alert awake oriented and in no distress.  Past Medical History:  Diagnosis Date  . Constipation 08/14/2011  . Diabetes mellitus (HCC)   . GERD (gastroesophageal reflux disease) 08/11/2017  . Herniated disc   . HTN (hypertension) 08/09/2011  . Intractable low back pain 08/07/2011  . Obese   . Radiculopathy, lumbosacral region 08/08/2011  . Sleep apnea   . UTI (lower urinary tract infection) 08/08/2011    Past Surgical History:  Procedure Laterality Date  . CESAREAN SECTION     x 4  . hysterectomy surgery    . lower back surgery  2013,2007   disc     Current Medications: Current Meds  Medication Sig  . gabapentin (NEURONTIN) 300 MG capsule   . hydrochlorothiazide (HYDRODIURIL) 12.5 MG tablet   . levocetirizine (XYZAL) 5 MG tablet Take  1 tablet by mouth at bedtime.  Marland Kitchen. lisinopril (PRINIVIL,ZESTRIL) 10 MG tablet Take 1 tablet by mouth daily.  . metFORMIN (GLUCOPHAGE-XR) 500 MG 24 hr tablet Take 1 tablet by mouth daily.  . VENTOLIN HFA 108 (90 Base) MCG/ACT inhaler Inhale 2 puffs into the lungs as needed.     Allergies:   Patient has no known allergies.   Social History   Socioeconomic History  . Marital status: Married    Spouse name: Not on file  . Number of children: 5  . Years of education: college  . Highest education level: Not on file  Occupational History  . Occupation: Company secretarystore manager    Employer: [employer] school [employer]  Social Needs  . Financial resource  strain: Not on file  . Food insecurity:    Worry: Not on file    Inability: Not on file  . Transportation needs:    Medical: Not on file    Non-medical: Not on file  Tobacco Use  . Smoking status: Never Smoker  . Smokeless tobacco: Never Used  Substance and Sexual Activity  . Alcohol use: Yes    Comment: Patient drinks 1-2 drinks a year.  . Drug use: No  . Sexual activity: Not on file  Lifestyle  . Physical activity:    Days per week: Not on file    Minutes per session: Not on file  . Stress: Not on file  Relationships  . Social connections:    Talks on phone: Not on file    Gets together: Not on file    Attends religious service: Not on file    Active member of club or organization: Not on file    Attends meetings of clubs or organizations: Not on file    Relationship status: Not on file  Other Topics Concern  . Not on file  Social History Narrative  . Not on file     Family History: The patient's family history includes CAD in her mother; Diabetes in her mother and paternal grandmother; High blood pressure in her maternal grandmother.  ROS:   Please see the history of present illness.    All other systems reviewed and are negative.  EKGs/Labs/Other Studies Reviewed:    The following studies were reviewed today: EKG reveals sinus rhythm nonspecific ST-T changes.   Recent Labs: No results found for requested labs within last 8760 hours.  Recent Lipid Panel No results found for: CHOL, TRIG, HDL, CHOLHDL, VLDL, LDLCALC, LDLDIRECT  Physical Exam:    VS:  BP (!) 150/110 (BP Location: Left Arm, Patient Position: Sitting, Cuff Size: Large)   Pulse 99   Ht 5\' 3"  (1.6 m)   Wt 249 lb (112.9 kg)   SpO2 97%   BMI 44.11 kg/m     Wt Readings from Last 3 Encounters:  08/16/17 249 lb (112.9 kg)  06/05/12 243 lb (110.2 kg)  08/07/11 238 lb (108 kg)     GEN: Patient is in no acute distress HEENT: Normal NECK: No JVD; No carotid bruits LYMPHATICS: No  lymphadenopathy CARDIAC: S1 S2 regular, 2/6 systolic murmur at the apex. RESPIRATORY:  Clear to auscultation without rales, wheezing or rhonchi  ABDOMEN: Soft, non-tender, non-distended MUSCULOSKELETAL:  No edema; No deformity  SKIN: Warm and dry NEUROLOGIC:  Alert and oriented x 3 PSYCHIATRIC:  Normal affect    Signed, Jasmin Brothersajan R Korde Jeppsen, MD  08/16/2017 11:32 AM    [facility]

## 2017-08-24 DIAGNOSIS — R0602 Shortness of breath: Secondary | ICD-10-CM | POA: Diagnosis not present

## 2017-08-24 DIAGNOSIS — R0789 Other chest pain: Secondary | ICD-10-CM | POA: Diagnosis not present

## 2017-08-25 DIAGNOSIS — R0789 Other chest pain: Secondary | ICD-10-CM | POA: Diagnosis not present

## 2017-08-25 DIAGNOSIS — R0602 Shortness of breath: Secondary | ICD-10-CM | POA: Diagnosis not present

## 2017-08-31 ENCOUNTER — Telehealth: Payer: Self-pay | Admitting: *Deleted

## 2017-08-31 NOTE — Telephone Encounter (Signed)
Left detailed message with exercise cardiolite stress test results on patient's cell phone per DPR. Advised patient to contact our office if she has further questions or concerns.

## 2017-10-17 DIAGNOSIS — F5089 Other specified eating disorder: Secondary | ICD-10-CM | POA: Diagnosis not present

## 2017-10-17 DIAGNOSIS — I10 Essential (primary) hypertension: Secondary | ICD-10-CM | POA: Diagnosis not present

## 2017-10-17 DIAGNOSIS — E119 Type 2 diabetes mellitus without complications: Secondary | ICD-10-CM | POA: Diagnosis not present

## 2017-11-14 DIAGNOSIS — J392 Other diseases of pharynx: Secondary | ICD-10-CM | POA: Diagnosis not present

## 2017-11-16 DIAGNOSIS — I10 Essential (primary) hypertension: Secondary | ICD-10-CM | POA: Diagnosis not present

## 2017-11-16 DIAGNOSIS — F5089 Other specified eating disorder: Secondary | ICD-10-CM | POA: Diagnosis not present

## 2017-11-16 DIAGNOSIS — E119 Type 2 diabetes mellitus without complications: Secondary | ICD-10-CM | POA: Diagnosis not present

## 2018-03-20 DIAGNOSIS — M25572 Pain in left ankle and joints of left foot: Secondary | ICD-10-CM | POA: Diagnosis not present

## 2018-03-20 DIAGNOSIS — M5416 Radiculopathy, lumbar region: Secondary | ICD-10-CM | POA: Diagnosis not present

## 2018-03-20 DIAGNOSIS — M5489 Other dorsalgia: Secondary | ICD-10-CM | POA: Diagnosis not present

## 2018-03-27 DIAGNOSIS — M25572 Pain in left ankle and joints of left foot: Secondary | ICD-10-CM | POA: Diagnosis not present

## 2018-03-27 DIAGNOSIS — M5489 Other dorsalgia: Secondary | ICD-10-CM | POA: Diagnosis not present

## 2018-03-27 DIAGNOSIS — M5416 Radiculopathy, lumbar region: Secondary | ICD-10-CM | POA: Diagnosis not present

## 2018-04-02 DIAGNOSIS — M25572 Pain in left ankle and joints of left foot: Secondary | ICD-10-CM | POA: Diagnosis not present

## 2018-04-02 DIAGNOSIS — M5489 Other dorsalgia: Secondary | ICD-10-CM | POA: Diagnosis not present

## 2018-04-02 DIAGNOSIS — M5416 Radiculopathy, lumbar region: Secondary | ICD-10-CM | POA: Diagnosis not present

## 2022-10-29 DIAGNOSIS — R079 Chest pain, unspecified: Secondary | ICD-10-CM | POA: Diagnosis not present

## 2022-10-29 DIAGNOSIS — I517 Cardiomegaly: Secondary | ICD-10-CM | POA: Diagnosis not present

## 2023-11-09 ENCOUNTER — Other Ambulatory Visit: Payer: Self-pay

## 2023-11-09 DIAGNOSIS — E1143 Type 2 diabetes mellitus with diabetic autonomic (poly)neuropathy: Secondary | ICD-10-CM | POA: Insufficient documentation

## 2023-11-09 DIAGNOSIS — R0789 Other chest pain: Secondary | ICD-10-CM | POA: Insufficient documentation

## 2023-11-09 DIAGNOSIS — Z6841 Body Mass Index (BMI) 40.0 and over, adult: Secondary | ICD-10-CM | POA: Insufficient documentation

## 2023-11-09 DIAGNOSIS — R000 Tachycardia, unspecified: Secondary | ICD-10-CM | POA: Insufficient documentation

## 2023-11-09 DIAGNOSIS — E1169 Type 2 diabetes mellitus with other specified complication: Secondary | ICD-10-CM | POA: Insufficient documentation

## 2023-11-13 ENCOUNTER — Ambulatory Visit

## 2023-11-13 VITALS — BP 170/122 | HR 97 | Ht 63.0 in | Wt 233.6 lb

## 2023-11-13 DIAGNOSIS — Z01810 Encounter for preprocedural cardiovascular examination: Secondary | ICD-10-CM

## 2023-11-13 DIAGNOSIS — R072 Precordial pain: Secondary | ICD-10-CM | POA: Diagnosis not present

## 2023-11-13 DIAGNOSIS — I10 Essential (primary) hypertension: Secondary | ICD-10-CM

## 2023-11-13 HISTORY — DX: Encounter for preprocedural cardiovascular examination: Z01.810

## 2023-11-13 MED ORDER — METOPROLOL TARTRATE 100 MG PO TABS: 100.0000 mg | ORAL_TABLET | Freq: Once | ORAL | 0 refills | Status: DC

## 2023-11-13 MED ORDER — LOSARTAN POTASSIUM 25 MG PO TABS: 25.0000 mg | ORAL_TABLET | Freq: Every day | ORAL | 3 refills | Status: DC

## 2023-11-13 MED ORDER — CARVEDILOL 6.25 MG PO TABS: 6.2500 mg | ORAL_TABLET | Freq: Two times a day (BID) | ORAL | 3 refills | Status: DC

## 2023-11-13 NOTE — Assessment & Plan Note (Signed)
 Continue with current ongoing weight management strategy through PCP. Gastric sleeve surgery tentatively being planned and will provide further preoperative cardiovascular risk assessment based on those of above testing being requested.

## 2023-11-13 NOTE — Assessment & Plan Note (Addendum)
 Uncontrolled blood pressures. Currently not on any medications. Recently at PCPs office visit blood pressures were relatively better controlled 138/74 mmHg. Consider there is significant component of whitecoat hypertension at today's visit.  Advised her to continue monitoring blood pressures at home closely. However given her relatively elevated heart rates and suboptimal blood pressures at recent PCPs office visit we will proceed with starting the following 2 medications. Start carvedilol 6.25 mg twice daily. Start losartan 25 mg once daily.  Blood work basic metabolic panel in about 1 week to monitor renal function and electrolytes given the above new medications.   Obtain transthoracic echocardiogram to assess cardiac structure and function for any significant change in comparison to prior echocardiogram from September 2024.[name]

## 2023-11-13 NOTE — Assessment & Plan Note (Signed)
 From cardiac standpoint she does report symptoms of chest pain which are atypical but also appear to be exertional in nature.  Given her cardiovascular risk factors we will proceed with cardiac CT coronary angiogram to rule out any significant obstructive coronary artery disease.

## 2023-11-13 NOTE — Progress Notes (Signed)
 Cardiology Consultation:    Date:  11/13/2023   ID:  Jasmin Gordon, DOB July 09, 1970, MRN [id number]  PCP:  Jasmin Nest, MD  Cardiologist:  Jasmin SAUNDERS Kieley Akter, MD   Referring MD: Jasmin Nest, MD   No chief complaint on file.    ASSESSMENT AND PLAN:   Jasmin Gordon 53 year old woman with history of diabetes mellitus, hypertension, obesity, OSA, GERD, hyperlipidemia. Last echocardiogram September 2024 at [facility] reported LVEF 55 to 60%, impaired diastolic dysfunction, no significant valve abnormalities. Stress test Lexiscan 10/29/2022 at [facility] noted no ischemia.  Denies any prior history of CAD, CHF, MI, CVA.  Here for further evaluation of chest pain which has 2 different components. Also requesting evaluation for preop cardiovascular risk assessment prior to her tentative gastric sleeve surgery coming up in the near future.   Problem List Items Addressed This Visit     HTN (hypertension)   Uncontrolled blood pressures. Currently not on any medications. Recently at PCPs office visit blood pressures were relatively better controlled 138/74 mmHg. Consider there is significant component of whitecoat hypertension at today's visit.  Advised her to continue monitoring blood pressures at home closely. However given her relatively elevated heart rates and suboptimal blood pressures at recent PCPs office visit we will proceed with starting the following 2 medications. Start carvedilol 6.25 mg twice daily. Start losartan 25 mg once daily.  Blood work basic metabolic panel in about 1 week to monitor renal function and electrolytes given the above new medications.   Obtain transthoracic echocardiogram to assess cardiac structure and function for any significant change in comparison to prior echocardiogram from September 2024.[name]       Relevant Medications   carvedilol (COREG) 6.25 MG tablet   losartan (COZAAR) 25 MG tablet   metoprolol   tartrate (LOPRESSOR ) 100 MG tablet   Chest pain - Primary   Chest pain. She does have cardiovascular risk factors in the form of age, diabetes, uncontrolled high blood pressure.  Lexiscan stress test with nuclear imaging last year September 2024 at [facility] reported no ischemia.  Given her overall cardiovascular risk factors proceed with further evaluation for CAD with cardiac CT coronary angiogram.       Relevant Orders   EKG 12-Lead (Completed)   ECHOCARDIOGRAM COMPLETE   Basic Metabolic Panel (BMET)   CT CORONARY MORPH W/CTA COR W/SCORE W/CA W/CM &/OR WO/CM   Morbid obesity (HCC)   Continue with current ongoing weight management strategy through PCP. Gastric sleeve surgery tentatively being planned and will provide further preoperative cardiovascular risk assessment based on those of above testing being requested.       Preop cardiovascular exam   From cardiac standpoint she does report symptoms of chest pain which are atypical but also appear to be exertional in nature.  Given her cardiovascular risk factors we will proceed with cardiac CT coronary angiogram to rule out any significant obstructive coronary artery disease.        Return to clinic tentatively in 2 to 3 months based on timing of the above tests requested.    History of Present Illness:    Jasmin Gordon is a 53 y.o. female who is being seen today for the evaluation of chest pain and preop cardiovascular risk assessment prior to elective gastric sleeve surgery at the request of Jasmin Nest, MD.  Pleasant woman here for the visit by herself.  Works in a [employer] and keeps herself busy walking around and lifting  heavy batteries at times.  No regular exercise.  Has history of diabetes mellitus, hypertension, obesity, OSA, GERD, hyperlipidemia. Last echocardiogram September 2024 at [facility] reported LVEF 55 to 60%, impaired diastolic dysfunction, no significant  valve abnormalities. Stress test Lexiscan 10/29/2022 at [facility] noted no ischemia.  Denies any prior history of CAD, CHF, MI, CVA. Currently on Ozempic for weight loss for the past 2 years and Lantus insulin for diabetes management.  Mentions she has had symptoms of chest pain. She describes 2 different kinds of pain.  1 type of chest pain started as she was trying to get out of the bed with intense sharp sensation that lasted for 5 to 10 minutes about 3 to 4 weeks ago.  This sensation is still present once in a while as she is lifting heavy objects at work such as batteries.  Also describes a sense of chest discomfort which is present occasionally with exertion such as walking.  Relieved with rest.  No significant symptoms at rest. Denies any shortness of breath, orthopnea, paroxysmal nocturnal dyspnea.  Mentions blood pressures at home are typically elevated when she checked. Has not been on any blood pressure lowering medications for over 2 years. Denies any syncopal or near syncopal episodes. Denies any pedal edema. Denies any palpitations, lightheadedness. Denies any blood in urine or stools.  Tells me with Ozempic her weight loss has been steady.  Currently looking at undergoing gastric sleeve surgery.  No date scheduled yet but she wants to get this done in November.  Does not smoke. Does not drink alcohol. No illicit drug use.   EKG in the clinic today shows sinus rhythm heart rate 97/min, PR interval 174 ms, QRS duration 76 ms, QTc 477 ms, no ischemia.  Fasting lipid panel from 04/28/2023 triglycerides 190, total cholesterol 274, HDL 42, LDL 194. Labs from 10-31-2023 notes hemoglobin A1c 7.9 Labs from 07-31-2023 notes sodium 141, potassium 3.3, BUN 13, creatinine 0.5, eGFR greater than 60    Past Medical History:  Diagnosis Date   Acute cystitis 08/11/2017   Anxiety and depression 08/11/2017   Atypical chest pain    BMI 40.0-44.9, adult (HCC)    Chest  pain 08/11/2017   Constipation 08/14/2011   Cough 08/11/2017   Diabetes mellitus (HCC)    Diabetic gastroparesis associated with type 2 diabetes mellitus (HCC)    Dyspnea on exertion 08/11/2017   GERD (gastroesophageal reflux disease) 08/11/2017   Hoarseness 08/11/2017   HTN (hypertension) 08/09/2011   Intractable low back pain 08/07/2011   Left flank pain 08/11/2017   Lower urinary tract infectious disease 08/08/2011   IMO SNOMED Dx Update Oct 2024     Malaise and fatigue 08/11/2017   Morbid obesity (HCC)    Numbness of foot 08/11/2017   Obese    Oliguria 08/11/2017   Paresthesia 08/11/2017   Radiculopathy, lumbosacral region 08/08/2011   Sciatica 08/11/2017   Sinus tachycardia by electrocardiogram    Sleep apnea    Type 2 diabetes mellitus with hyperlipidemia [facility])     Past Surgical History:  Procedure Laterality Date   CESAREAN SECTION     x 4   hysterectomy surgery     lower back surgery  2013,2007   disc     Current Medications: Current Meds  Medication Sig   carvedilol (COREG) 6.25 MG tablet Take 1 tablet (6.25 mg total) by mouth 2 (two) times daily with a meal.   insulin glargine (LANTUS) 100 UNIT/ML injection Inject 15  Units into the skin daily.   losartan (COZAAR) 25 MG tablet Take 1 tablet (25 mg total) by mouth daily.   metoprolol  tartrate (LOPRESSOR ) 100 MG tablet Take 1 tablet (100 mg total) by mouth once for 1 dose. Please take this medication 2 hours before CT   OZEMPIC, 1 MG/DOSE, 4 MG/3ML SOPN Inject 1 mg into the skin once a week.     Allergies:   Bee venom   Social History   Socioeconomic History   Marital status: Married    Spouse name: Not on file   Number of children: 5   Years of education: college   Highest education level: Not on file  Occupational History   Occupation: Company secretary: [employer]  Tobacco Use   Smoking status: Never   Smokeless tobacco: Never  Substance and Sexual Activity   Alcohol use: Yes     Comment: Patient drinks 1-2 drinks a year.   Drug use: No   Sexual activity: Not on file  Other Topics Concern   Not on file  Social History Narrative   Not on file   Social Drivers of Health   Financial Resource Strain: Low Risk  (10/21/2023)   Received from [facility]   Overall Financial Resource Strain (CARDIA)    How hard is it for you to pay for the very basics like food, housing, medical care, and heating?: Not very hard  Food Insecurity: No Food Insecurity (10/21/2023)   Received from [facility]   Hunger Vital Sign    Within the past 12 months, you worried that your food would run out before you got the money to buy more.: Never true    Within the past 12 months, the food you bought just didn't last and you didn't have money to get more.: Never true  Transportation Needs: No Transportation Needs (10/21/2023)   Received from [facility] - Transportation    In the past 12 months, has lack of transportation kept you from medical appointments or from getting medications?: No    In the past 12 months, has lack of transportation kept you from meetings, work, or from getting things needed for daily living?: No  Physical Activity: Insufficiently Active (10/21/2023)   Received from [facility]   Exercise Vital Sign    On average, how many days per week do you engage in moderate to strenuous exercise (like a brisk walk)?: 1 day    On average, how many minutes do you engage in exercise at this level?: 60 min  Stress: No Stress Concern Present (10/21/2023)   Received from [facility] of Occupational Health - Occupational Stress Questionnaire    Do you feel stress - tense, restless, nervous, or anxious, or unable to sleep at night because your mind is troubled all the time - these days?: Only a little  Social Connections: Socially Integrated (10/21/2023)   Received from [facility]   Social Network    How would you rate your social network  (family, work, friends)?: Good participation with social networks     Family History: The patient's family history includes CAD in her mother; Diabetes in her mother and paternal grandmother; High blood pressure in her maternal grandmother. ROS:   Please see the history of present illness.    All 14 point review of systems negative except as described per history of present illness.  EKGs/Labs/Other Studies Reviewed:    The  following studies were reviewed today:    EKG:  EKG Interpretation Date/Time:  Monday November 13 2023 16:09:58 EDT Ventricular Rate:  97 PR Interval:  174 QRS Duration:  76 QT Interval:  376 QTC Calculation: 477 R Axis:   30  Text Interpretation: Normal sinus rhythm Low voltage QRS No previous ECGs available Confirmed by Liborio Hai reddy [phone]) on 11/13/2023 4:36:38 PM    Recent Labs: No results found for requested labs within last 365 days.  Recent Lipid Panel No results found for: CHOL, TRIG, HDL, CHOLHDL, VLDL, LDLCALC, LDLDIRECT  Physical Exam:    VS:  BP (!) 170/122   Pulse 97   Ht 5' 3 (1.6 m)   Wt 233 lb 9.6 oz (106 kg)   SpO2 98%   BMI 41.38 kg/m     Wt Readings from Last 3 Encounters:  11/13/23 233 lb 9.6 oz (106 kg)  08/16/17 249 lb (112.9 kg)  06/05/12 243 lb (110.2 kg)     GENERAL:  Well nourished, well developed in no acute distress NECK: No JVD; No carotid bruits CARDIAC: RRR, S1 and S2 present, no murmurs, no rubs, no gallops CHEST:  Clear to auscultation without rales, wheezing or rhonchi  Extremities: No pitting pedal edema. Pulses bilaterally symmetric with radial 2+ and dorsalis pedis 2+ NEUROLOGIC:  Alert and oriented x 3  Medication Adjustments/Labs and Tests Ordered: Current medicines are reviewed at length with the patient today.  Concerns regarding medicines are outlined above.  Orders Placed This Encounter  Procedures   CT CORONARY MORPH W/CTA COR W/SCORE W/CA W/CM &/OR WO/CM   Basic  Metabolic Panel (BMET)   EKG 12-Lead   ECHOCARDIOGRAM COMPLETE   Meds ordered this encounter  Medications   carvedilol (COREG) 6.25 MG tablet    Sig: Take 1 tablet (6.25 mg total) by mouth 2 (two) times daily with a meal.    Dispense:  180 tablet    Refill:  3   losartan (COZAAR) 25 MG tablet    Sig: Take 1 tablet (25 mg total) by mouth daily.    Dispense:  90 tablet    Refill:  3   metoprolol  tartrate (LOPRESSOR ) 100 MG tablet    Sig: Take 1 tablet (100 mg total) by mouth once for 1 dose. Please take this medication 2 hours before CT    Dispense:  1 tablet    Refill:  0    Signed, Tal Neer reddy Jakobie Henslee, MD, MPH, [facility]. 11/13/2023 5:18 PM    [facility]

## 2023-11-13 NOTE — Assessment & Plan Note (Signed)
 Chest pain. She does have cardiovascular risk factors in the form of age, diabetes, uncontrolled high blood pressure.  Lexiscan stress test with nuclear imaging last year September 2024 at [facility] reported no ischemia.  Given her overall cardiovascular risk factors proceed with further evaluation for CAD with cardiac CT coronary angiogram.

## 2023-11-13 NOTE — Patient Instructions (Signed)
 Medication Instructions:  Your physician has recommended you make the following change in your medication:   START: Coreg 6.25 mg two times daily START: Losartan 25 mg daily  *If you need a refill on your cardiac medications before your next appointment, please call your pharmacy*  Lab Work: Your physician recommends that you return for lab work in:   Labs in 7 days: BMP  If you have labs (blood work) drawn today and your tests are completely normal, you will receive your results only by: MyChart Message (if you have MyChart) OR A paper copy in the mail If you have any lab test that is abnormal or we need to change your treatment, we will call you to review the results.  Testing/Procedures: Your physician has requested that you have an echocardiogram. Echocardiography is a painless test that uses sound waves to create images of your heart. It provides your doctor with information about the size and shape of your heart and how well your heart's chambers and valves are working. This procedure takes approximately one hour. There are no restrictions for this procedure. Please do NOT wear cologne, perfume, aftershave, or lotions (deodorant is allowed). Please arrive 15 minutes prior to your appointment time.  Please note: We ask at that you not bring children with you during ultrasound (echo/ vascular) testing. Due to room size and safety concerns, children are not allowed in the ultrasound rooms during exams. Our front office staff cannot provide observation of children in our lobby area while testing is being conducted. An adult accompanying a patient to their appointment will only be allowed in the ultrasound room at the discretion of the ultrasound technician under special circumstances. We apologize for any inconvenience.    Your cardiac CT will be scheduled at one of the below locations:   [facility] [address] [place], [place] [address] [phone] (Severe  contrast allergies only)  OR   [facility] [address] [place], [place] [address] [phone]  OR   [facility] [address] [place], [place] [address] [phone]  OR   [facility] and Vascular Tower [address]  [place], [place] [address] [phone]  OR   [facility] [address] [place], [place] [phone]  If scheduled at [facility], please arrive at the [facility] and Children's Entrance (Entrance C2) of [facility] 30 minutes prior to test start time. You can use the FREE valet parking offered at entrance C (encouraged to control the heart rate for the test)  Proceed to the [facility] Radiology Department (first floor) to check-in and test prep.  All radiology patients and guests should use entrance C2 at [facility], accessed from [facility], even though the hospital's physical address listed is [address].  If scheduled at the Heart and Vascular Tower at [address], please enter the parking lot using the [address] entrance and use the FREE valet service at the patient drop-off area. Enter the building and check-in with registration on the main floor.  If scheduled at [facility], please arrive to the Heart and Vascular Center 15 mins early for check-in and test prep.  There is spacious parking and easy access to the radiology department from the [facility] Heart and Vascular entrance. Please enter here and check-in with the desk attendant.   If scheduled at [facility], please arrive 30 minutes early for check-in  and test prep.  Please follow these instructions carefully (unless otherwise directed):  An IV will be required for this test and Nitroglycerin will be given.  Hold all erectile dysfunction medications at least 3 days (72 hrs) prior to test. (Ie viagra, cialis, sildenafil, tadalafil, etc)   On the  Night Before the Test: Be sure to Drink plenty of water. Do not consume any caffeinated/decaffeinated beverages or chocolate 12 hours prior to your test. Do not take any antihistamines 12 hours prior to your test.  On the Day of the Test: Drink plenty of water until 1 hour prior to the test. Do not eat any food 1 hour prior to test. You may take your regular medications prior to the test.  Take metoprolol  (Lopressor ) two hours prior to test. Patients who wear a continuous glucose monitor MUST remove the device prior to scanning. FEMALES- please wear underwire-free bra if available, avoid dresses & tight clothing      After the Test: Drink plenty of water. After receiving IV contrast, you may experience a mild flushed feeling. This is normal. On occasion, you may experience a mild rash up to 24 hours after the test. This is not dangerous. If this occurs, you can take Benadryl 25 mg, Zyrtec, Claritin, or Allegra and increase your fluid intake. (Patients taking Tikosyn should avoid Benadryl, and may take Zyrtec, Claritin, or Allegra) If you experience trouble breathing, this can be serious. If it is severe call 911 IMMEDIATELY. If it is mild, please call our office.  We will call to schedule your test 2-4 weeks out understanding that some insurance companies will need an authorization prior to the service being performed.   For more information and frequently asked questions, please visit our website : [email or web address]  For non-scheduling related questions, please contact the cardiac imaging nurse navigator should you have any questions/concerns: Cardiac Imaging Nurse Navigators Direct Office Dial: [phone]   For scheduling needs, including cancellations and rescheduling, please call [place], [phone].   Follow-Up: At [facility], you and your health needs are our priority.  As part of our continuing mission to provide you with exceptional heart care,  our providers are all part of one team.  This team includes your primary Cardiologist (physician) and Advanced Practice Providers or APPs (Physician Assistants and Nurse Practitioners) who all work together to provide you with the care you need, when you need it.  Your next appointment:   2 month(s)  Provider:   [name], MD    We recommend signing up for the patient portal called MyChart.  Sign up information is provided on this After Visit Summary.  MyChart is used to connect with patients for Virtual Visits (Telemedicine).  Patients are able to view lab/test results, encounter notes, upcoming appointments, etc.  Non-urgent messages can be sent to your provider as well.   To learn more about what you can do with MyChart, go to [email or web address].   Other Instructions None

## 2023-11-15 ENCOUNTER — Ambulatory Visit

## 2023-11-15 DIAGNOSIS — R072 Precordial pain: Secondary | ICD-10-CM | POA: Diagnosis not present

## 2023-11-15 DIAGNOSIS — I517 Cardiomegaly: Secondary | ICD-10-CM

## 2023-11-15 LAB — ECHOCARDIOGRAM COMPLETE
AR max vel: 2.09 cm2
AV Area VTI: 2.21 cm2
AV Area mean vel: 2.13 cm2
AV Mean grad: 4.3 mmHg
AV Peak grad: 7.8 mmHg
Ao pk vel: 1.4 m/s
Area-P 1/2: 4.91 cm2
MV VTI: 2.47 cm2
S' Lateral: 2.9 cm

## 2023-11-20 ENCOUNTER — Encounter (HOSPITAL_COMMUNITY): Payer: Self-pay

## 2023-11-21 ENCOUNTER — Ambulatory Visit (HOSPITAL_BASED_OUTPATIENT_CLINIC_OR_DEPARTMENT_OTHER)
Admission: RE | Admit: 2023-11-21 | Discharge: 2023-11-21 | Disposition: A | Discharge status: Home / Self Care | Source: Ambulatory Visit

## 2023-11-21 DIAGNOSIS — R072 Precordial pain: Secondary | ICD-10-CM | POA: Insufficient documentation

## 2023-11-21 DIAGNOSIS — R931 Abnormal findings on diagnostic imaging of heart and coronary circulation: Secondary | ICD-10-CM | POA: Diagnosis present

## 2023-11-21 LAB — POCT I-STAT CREATININE: Creatinine, Ser: 0.6 mg/dL (ref 0.44–1.00)

## 2023-11-21 MED ORDER — IOHEXOL 350 MG/ML SOLN
100.0000 mL | Freq: Once | INTRAVENOUS | Status: AC | PRN
  Administered 2023-11-21: 110 mL via INTRAVENOUS

## 2023-11-21 MED ORDER — METOPROLOL TARTRATE 5 MG/5ML IV SOLN
10.0000 mg | Freq: Once | INTRAVENOUS | Status: AC
  Administered 2023-11-21: 10 mg via INTRAVENOUS

## 2023-11-21 MED ORDER — DILTIAZEM HCL 25 MG/5ML IV SOLN: 10.0000 mg | Freq: Once | INTRAVENOUS | Status: AC

## 2023-11-21 MED ORDER — NITROGLYCERIN 0.4 MG SL SUBL
0.8000 mg | SUBLINGUAL_TABLET | Freq: Once | SUBLINGUAL | Status: AC
  Administered 2023-11-21: 0.8 mg via SUBLINGUAL

## 2023-11-21 MED ORDER — DILTIAZEM HCL 25 MG/5ML IV SOLN
INTRAVENOUS | Status: AC
  Administered 2023-11-21: 10 mg via INTRAVENOUS
  Filled 2023-11-21: qty 5

## 2023-11-22 ENCOUNTER — Ambulatory Visit: Payer: Self-pay

## 2023-11-22 ENCOUNTER — Ambulatory Visit (HOSPITAL_BASED_OUTPATIENT_CLINIC_OR_DEPARTMENT_OTHER)
Admission: RE | Admit: 2023-11-22 | Discharge: 2023-11-22 | Disposition: A | Source: Ambulatory Visit | Attending: Cardiology | Admitting: Cardiology

## 2023-11-22 ENCOUNTER — Other Ambulatory Visit: Payer: Self-pay | Admitting: Cardiology

## 2023-11-22 DIAGNOSIS — R072 Precordial pain: Secondary | ICD-10-CM | POA: Diagnosis not present

## 2023-11-22 DIAGNOSIS — I25118 Atherosclerotic heart disease of native coronary artery with other forms of angina pectoris: Secondary | ICD-10-CM

## 2023-11-22 DIAGNOSIS — R931 Abnormal findings on diagnostic imaging of heart and coronary circulation: Secondary | ICD-10-CM

## 2023-11-22 DIAGNOSIS — I2510 Atherosclerotic heart disease of native coronary artery without angina pectoris: Secondary | ICD-10-CM

## 2023-11-22 HISTORY — DX: Atherosclerotic heart disease of native coronary artery without angina pectoris: I25.10

## 2023-11-22 NOTE — Progress Notes (Signed)
 FFR Order

## 2023-11-23 ENCOUNTER — Telehealth: Payer: Self-pay

## 2023-11-23 ENCOUNTER — Ambulatory Visit

## 2023-11-23 VITALS — BP 160/100 | HR 96 | Ht 62.0 in | Wt 241.2 lb

## 2023-11-23 DIAGNOSIS — E785 Hyperlipidemia, unspecified: Secondary | ICD-10-CM | POA: Insufficient documentation

## 2023-11-23 DIAGNOSIS — I10 Essential (primary) hypertension: Secondary | ICD-10-CM

## 2023-11-23 DIAGNOSIS — E782 Mixed hyperlipidemia: Secondary | ICD-10-CM

## 2023-11-23 DIAGNOSIS — I25118 Atherosclerotic heart disease of native coronary artery with other forms of angina pectoris: Secondary | ICD-10-CM

## 2023-11-23 HISTORY — DX: Hyperlipidemia, unspecified: E78.5

## 2023-11-23 MED ORDER — CARVEDILOL 12.5 MG PO TABS: 12.5000 mg | ORAL_TABLET | Freq: Two times a day (BID) | ORAL | 3 refills | Status: DC

## 2023-11-23 MED ORDER — ROSUVASTATIN CALCIUM 10 MG PO TABS: 10.0000 mg | ORAL_TABLET | Freq: Every day | ORAL | 3 refills | Status: DC

## 2023-11-23 NOTE — Assessment & Plan Note (Signed)
 Blood pressure remained uncontrolled. Titrate up carvedilol to 12.5 mg twice daily. Continue losartan 25 mg once daily. Target blood pressure below 130/80 mmHg.

## 2023-11-23 NOTE — Telephone Encounter (Signed)
 Results reviewed with pt as per Dr. Madireddy's note.  Pt verbalized understanding and had no additional questions. Routed to PCP. Appoinement made for 11/23/23

## 2023-11-23 NOTE — Assessment & Plan Note (Signed)
 We discussed about potential delay in proceeding with gastric sleeve surgery monte is in preliminary discussion regarding this procedure for her weight loss management] .  If she requires a coronary stent as she would be placed on 2 blood thinners for at least 6 to 12 months, ideally which need to be continued uninterrupted.

## 2023-11-23 NOTE — Telephone Encounter (Signed)
 Pt requesting a c/b regarding CT results. Please advise

## 2023-11-23 NOTE — Assessment & Plan Note (Addendum)
 Hyperlipidemia with last cholesterol levels from 04/28/2023 LDL 194, total cholesterol 274, HDL 42, triglycerides 190.  With her history of diabetes and now with coronary artery disease, target LDL less than 55 mg/dL.  Previously not on statins. Will initiate Crestor 10 mg once daily.  Discussed about potential side effects such as muscle aches, myopathies, need for LFTs monitoring tentatively in 1 month She is agreeable.

## 2023-11-23 NOTE — Telephone Encounter (Signed)
 Results reviewed with pt as per Dr. Madireddy's note.  Pt verbalized understanding and had no additional questions. Routed to PCP.

## 2023-11-23 NOTE — Assessment & Plan Note (Addendum)
 Chest pain symptoms on exertion. Abnormal cardiac CT 11/21/2023.  Proximal LAD lesion abnormal CT FFR.  Calcium score 83  Discussed extensively about cardiac cath procedure with her.  Tentative scheduling the procedure at [facility], possible need to stay overnight at the hospital reviewed. Shared Decision Making/Informed Consent{ The risks [stroke (1 in 1000), death (1 in 1000), kidney failure [usually temporary] (1 in 100), minor bleeding (1 in 100), major bleeding (1 in 1000),  Injury requiring emergent vascular or cardiac surger (1 in 1000), allergic reaction [possibly serious] (1 in 200)], benefits (diagnostic support and management of coronary artery disease) and alternatives of a cardiac catheterization were discussed in detail with her and she is willing to proceed.  Advised her to avoid moderate to heavy exertion. If any significant symptoms at rest or prolonged symptoms advised to call 911 or head to the ER.  Will try to schedule her for cardiac catheter at the earliest.  Continue aspirin 81 mg once daily Start Crestor 10 mg once daily.  Recommended limiting to light activities and no moderate to heavy exertion.  If possible take time off from work until cardiac cath is completed and we can provide a return to work clearance after the cardiac cath.

## 2023-11-23 NOTE — Progress Notes (Signed)
 Cardiology Consultation:    Date:  11/23/2023   ID:  Jasmin Gordon, DOB [birth year], MRN [id number]  PCP:  [name], MD  Cardiologist:  Alean SAUNDERS Kynzi Levay, MD   Referring MD: [name], MD   No chief complaint on file.    ASSESSMENT AND PLAN:   Jasmin Gordon 53 year old woman history of diabetes mellitus, hypertension, obesity [currently on Ozempic and is considering gastric sleeve surgery], OSA, GERD, hyperlipidemia.   With symptoms of exertional chest pain.  Now with abnormal cardiac CT coronary imaging study from 11/21/2023 proximal LAD lesion hemodynamically significant by CT FFR. Echocardiogram 11/15/2023 with normal biventricular function LVEF 60 to 65%, mild LVH, grade 1 diastolic dysfunction, no significant valve abnormalities.  Here to review test results and discussed further evaluation with cardiac cath.  Problem List Items Addressed This Visit     HTN (hypertension)   Blood pressure remained uncontrolled. Titrate up carvedilol to 12.5 mg twice daily. Continue losartan 25 mg once daily. Target blood pressure below 130/80 mmHg.      Relevant Medications   rosuvastatin (CRESTOR) 10 MG tablet   carvedilol (COREG) 12.5 MG tablet   Other Relevant Orders   CBC   Basic Metabolic Panel (BMET)   Morbid obesity (HCC)   We discussed about potential delay in proceeding with gastric sleeve surgery [she is in preliminary discussion regarding this procedure for her weight loss management] .  If she requires a coronary stent as she would be placed on 2 blood thinners for at least 6 to 12 months, ideally which need to be continued uninterrupted.      Relevant Orders   CBC   Basic Metabolic Panel (BMET)   CAD (coronary artery disease), abnormal cardiac CT 11-21-2023 - Primary   Chest pain symptoms on exertion. Abnormal cardiac CT 11/21/2023.  Proximal LAD lesion abnormal CT FFR.  Calcium score 83  Discussed extensively about cardiac cath procedure with her.   Tentative scheduling the procedure at [facility], possible need to stay overnight at the hospital reviewed. Shared Decision Making/Informed Consent{ The risks [stroke (1 in 1000), death (1 in 1000), kidney failure [usually temporary] (1 in 100), minor bleeding (1 in 100), major bleeding (1 in 1000),  Injury requiring emergent vascular or cardiac surger (1 in 1000), allergic reaction [possibly serious] (1 in 200)], benefits (diagnostic support and management of coronary artery disease) and alternatives of a cardiac catheterization were discussed in detail with her and she is willing to proceed.  Advised her to avoid moderate to heavy exertion. If any significant symptoms at rest or prolonged symptoms advised to call 911 or head to the ER.  Will try to schedule her for cardiac catheter at the earliest.  Continue aspirin 81 mg once daily Start Crestor 10 mg once daily.  Recommended limiting to light activities and no moderate to heavy exertion.  If possible take time off from work until cardiac cath is completed and we can provide a return to work clearance after the cardiac cath.      Relevant Medications   rosuvastatin (CRESTOR) 10 MG tablet   carvedilol (COREG) 12.5 MG tablet   Other Relevant Orders   CBC   Basic Metabolic Panel (BMET)   Hyperlipidemia   Hyperlipidemia with last cholesterol levels from 04/28/2023 LDL 194, total cholesterol 274, HDL 42, triglycerides 190.  With her history of diabetes and now with coronary artery disease, target LDL less than 55 mg/dL.  Previously not on statins. Will initiate Crestor 10  mg once daily.  Discussed about potential side effects such as muscle aches, myopathies, need for LFTs monitoring tentatively in 1 month She is agreeable.         Relevant Medications   rosuvastatin (CRESTOR) 10 MG tablet   carvedilol (COREG) 12.5 MG tablet   Other Relevant Orders   CBC   Basic Metabolic Panel (BMET)   Return to clinic tentatively in  4 to 6 weeks   History of Present Illness:    Jasmin Gordon is a 53 y.o. female who is being seen today for follow-up visit to discuss abnormal cardiac CT results. PCP is [name], MD. Last visit with me in the office was 11/13/2023 and here for an early follow-up given abnormal findings on cardiac CT imaging from 11/21/2023.  Pleasant woman works in Compt[employer] and busy with work and constantly on the move.  No regular exercise outside of work.  history of diabetes mellitus, hypertension, obesity [currently on Ozempic and is considering gastric sleeve surgery], OSA, GERD, hyperlipidemia.   With prior cardiac w/u with  echocardiogram September 2024 at [facility] reported LVEF 55 to 60%, impaired diastolic dysfunction, no significant valve abnormalities. Stress test Lexiscan 10/29/2022 at [facility] noted no ischemia.  Seen for evaluation of chest pressure with exertion at last office visit and further evaluation with echocardiogram and cardiac CT requested.    Transthoracic echocardiogram 11/15/2023 noted normal biventricular function LVEF 60 to 65%, mild LVH, grade 1 diastolic dysfunction, no significant valve abnormalities.  Cardiac CT coronary angiogram completed 11/21/2023 notes severe proximal LAD lesion which was hemodynamically significant on CT FFR assessment.  Calcium score 83.  Here for the visit today by herself.  Mentions she continues to feel the symptoms and has been noticing exertional symptoms even at work and more recently at grocery shopping she was more easily tired and observing chest pressure with exertion.  Discussed the results from the cardiac CT and echocardiogram.  Her last dose of Ozempic was last Thursday.  Was due for today and has not taken the dose yet.  Advised her to hold off on the Ozempic dose until the cardiac cath is completed.  Tolerating carvedilol and losartan well.  No significant side effects. Blood  pressures remain uncontrolled. Past Medical History:  Diagnosis Date   Acute cystitis 08/11/2017   Anxiety and depression 08/11/2017   Atypical chest pain    BMI 40.0-44.9, adult (HCC)    Chest pain 08/11/2017   Constipation 08/14/2011   Cough 08/11/2017   Diabetes mellitus (HCC)    Diabetic gastroparesis associated with type 2 diabetes mellitus (HCC)    Dyspnea on exertion 08/11/2017   GERD (gastroesophageal reflux disease) 08/11/2017   Hoarseness 08/11/2017   HTN (hypertension) 08/09/2011   Intractable low back pain 08/07/2011   Left flank pain 08/11/2017   Lower urinary tract infectious disease 08/08/2011   IMO SNOMED Dx Update Oct 2024     Malaise and fatigue 08/11/2017   Morbid obesity (HCC)    Numbness of foot 08/11/2017   Obese    Oliguria 08/11/2017   Paresthesia 08/11/2017   Radiculopathy, lumbosacral region 08/08/2011   Sciatica 08/11/2017   Sinus tachycardia by electrocardiogram    Sleep apnea    Type 2 diabetes mellitus with hyperlipidemia [facility])     Past Surgical History:  Procedure Laterality Date   CESAREAN SECTION     x 4   hysterectomy surgery     lower back surgery  2013,2007  disc     Current Medications: Current Meds  Medication Sig   carvedilol (COREG) 12.5 MG tablet Take 1 tablet (12.5 mg total) by mouth 2 (two) times daily.   rosuvastatin (CRESTOR) 10 MG tablet Take 1 tablet (10 mg total) by mouth daily.     Allergies:   Bee venom   Social History   Socioeconomic History   Marital status: Married    Spouse name: Not on file   Number of children: 5   Years of education: college   Highest education level: Not on file  Occupational History   Occupation: Company secretary: [employer]  Tobacco Use   Smoking status: Never   Smokeless tobacco: Never  Substance and Sexual Activity   Alcohol use: Yes    Comment: Patient drinks 1-2 drinks a year.   Drug use: No   Sexual activity: Not on file  Other Topics Concern    Not on file  Social History Narrative   Not on file   Social Drivers of Health   Financial Resource Strain: Low Risk  (10/21/2023)   [facility]   Overall Financial Resource Strain (CARDIA)    How hard is it for you to pay for the very basics like food, housing, medical care, and heating?: Not very hard  Food Insecurity: No Food Insecurity (10/21/2023)   Received from [facility]   Hunger Vital Sign    Within the past 12 months, you worried that your food would run out before you got the money to buy more.: Never true    Within the past 12 months, the food you bought just didn't last and you didn't have money to get more.: Never true  Transportation Needs: No Transportation Needs (10/21/2023)   Received from [facility] - Transportation    In the past 12 months, has lack of transportation kept you from medical appointments or from getting medications?: No    In the past 12 months, has lack of transportation kept you from meetings, work, or from getting things needed for daily living?: No  Physical Activity: Insufficiently Active (10/21/2023)   Received from [facility]   Exercise Vital Sign    On average, how many days per week do you engage in moderate to strenuous exercise (like a brisk walk)?: 1 day    On average, how many minutes do you engage in exercise at this level?: 60 min  Stress: No Stress Concern Present (10/21/2023)   Received from [facility] - Occupational Stress Questionnaire    Do you feel stress - tense, restless, nervous, or anxious, or unable to sleep at night because your mind is troubled all the time - these days?: Only a little  Social Connections: Socially Integrated (10/21/2023)   Received from [facility]   Social Network    How would you rate your social network (family, work, friends)?: Good participation with social networks     Family History: The patient's family history  includes CAD in her mother; Diabetes in her mother and paternal grandmother; High blood pressure in her maternal grandmother. ROS:   Please see the history of present illness.    All 14 point review of systems negative except as described per history of present illness.  EKGs/Labs/Other Studies Reviewed:    The following studies were reviewed today:   EKG:       Recent Labs: 11/21/2023: Creatinine, Ser  0.60  Recent Lipid Panel No results found for: CHOL, TRIG, HDL, CHOLHDL, VLDL, LDLCALC, LDLDIRECT  Physical Exam:    VS:  BP (!) 160/100   Pulse 96   Ht 5' 2 (1.575 m)   Wt 241 lb 3.2 oz (109.4 kg)   SpO2 99%   BMI 44.12 kg/m     Wt Readings from Last 3 Encounters:  11/23/23 241 lb 3.2 oz (109.4 kg)  11/13/23 233 lb 9.6 oz (106 kg)  08/16/17 249 lb (112.9 kg)     GENERAL:  Well nourished, well developed in no acute distress NECK: No JVD; No carotid bruits CARDIAC: RRR, S1 and S2 present, no murmurs, no rubs, no gallops CHEST:  Clear to auscultation without rales, wheezing or rhonchi  Extremities: No pitting pedal edema. Pulses bilaterally symmetric with radial 2+  NEUROLOGIC:  Alert and oriented x 3  Medication Adjustments/Labs and Tests Ordered: Current medicines are reviewed at length with the patient today.  Concerns regarding medicines are outlined above.  Orders Placed This Encounter  Procedures   CBC   Basic Metabolic Panel (BMET)   Meds ordered this encounter  Medications   rosuvastatin (CRESTOR) 10 MG tablet    Sig: Take 1 tablet (10 mg total) by mouth daily.    Dispense:  90 tablet    Refill:  3   carvedilol (COREG) 12.5 MG tablet    Sig: Take 1 tablet (12.5 mg total) by mouth 2 (two) times daily.    Dispense:  180 tablet    Refill:  3    Signed, Kimorah Ridolfi reddy Cassius Cullinane, MD, MPH, [facility]. 11/23/2023 11:52 AM    [facility]

## 2023-11-23 NOTE — Patient Instructions (Signed)
 Medication Instructions:  Your physician has recommended you make the following change in your medication:   START: Crestor 10 mg daily START: Carvedilol 12.5 mg two times daily  HOLD Ozempic until after the cardiac cath.  *If you need a refill on your cardiac medications before your next appointment, please call your pharmacy*  Lab Work: Your physician recommends that you return for lab work in:   Labs today: CBC, BMP  If you have labs (blood work) drawn today and your tests are completely normal, you will receive your results only by: MyChart Message (if you have MyChart) OR A paper copy in the mail If you have any lab test that is abnormal or we need to change your treatment, we will call you to review the results.  Testing/Procedures:  [name]  Jasmin Gordon [facility]  11/23/2023  You are scheduled for a Cardiac Catheterization on Wednesday, October 29 with Dr. Alm Clay.  1. Please arrive at the [facility] (Main Entrance A) at [facility]: [address] [place], [place] [address] at 11:00 AM (This time is 2 hour(s) before your procedure to ensure your preparation).   Free valet parking service is available. You will check in at ADMITTING. The support person will be asked to wait in the waiting room.  It is OK to have someone drop you off and come back when you are ready to be discharged.    Special note: Every effort is made to have your procedure done on time. Please understand that emergencies sometimes delay scheduled procedures.  2. Diet: Light meals may be eaten up to 6 hours before scheduled procedures from 12N and after; please stop eating at 7:00 AM   Light meal consist of plain toast, fruit, light soups, crackers.   3. Hydration: You need to be well hydrated before your procedure. On October  29, you may drink approved liquids (see below) until 2 hours before the procedure, with 16 oz of water as your last intake.   List of approved liquids water, clear juice, clear tea, black coffee, fruit juices, non-citric and without pulp, carbonated beverages, Gatorade, Kool -Aid, plain Jello-O and plain ice popsicles.  4. Labs: You will need to have blood drawn on Thursday, October 23 at [facility]: [address] [place], [place] . You do not need to be fasting.  5. Medication instructions in preparation for your procedure:   Contrast Allergy: No  On the morning of your procedure, take your Aspirin 81 mg and any morning medicines NOT listed above.  You may use sips of water.  6. Plan to go home the same day, you will only stay overnight if medically necessary. 7. Bring a current list of your medications and current insurance cards. 8. You MUST have a responsible person to drive you home. 9. Someone MUST be with you the first 24 hours after you arrive home or your discharge will be delayed. 10. Please wear clothes that are easy to get on and off and wear slip-on shoes.  Thank you for allowing us  to care for you!   -- [place] [facility]   Follow-Up: At [facility], you and your health needs are our priority.  As part of our continuing mission to provide you with exceptional heart care, our providers are all part of one team.  This team includes your primary Cardiologist (physician) and Advanced Practice Providers or APPs (Physician Assistants and Nurse Practitioners) who all work together to provide you with the care you need, when you need it.  Your next appointment:   6 week(s)  Provider:   [name], MD    We recommend signing up for the patient portal called MyChart.  Sign up information is provided on this After Visit Summary.  MyChart is used to connect with patients for Virtual Visits (Telemedicine).  Patients are able to view lab/test  results, encounter notes, upcoming appointments, etc.  Non-urgent messages can be sent to your provider as well.   To learn more about what you can do with MyChart, go to [email or web address].   Other Instructions None

## 2023-11-24 LAB — CBC
Hematocrit: 43.7 % (ref 34.0–46.6)
Hemoglobin: 14.1 g/dL (ref 11.1–15.9)
MCH: 29.1 pg (ref 26.6–33.0)
MCHC: 32.3 g/dL (ref 31.5–35.7)
MCV: 90 fL (ref 79–97)
Platelets: 374 x10E3/uL (ref 150–450)
RBC: 4.85 x10E6/uL (ref 3.77–5.28)
RDW: 12.7 % (ref 11.7–15.4)
WBC: 12 x10E3/uL — ABNORMAL HIGH (ref 3.4–10.8)

## 2023-11-24 LAB — BASIC METABOLIC PANEL WITH GFR
BUN/Creatinine Ratio: 25 — ABNORMAL HIGH (ref 9–23)
BUN: 14 mg/dL (ref 6–24)
CO2: 22 mmol/L (ref 20–29)
Calcium: 9.1 mg/dL (ref 8.7–10.2)
Chloride: 101 mmol/L (ref 96–106)
Creatinine, Ser: 0.56 mg/dL — ABNORMAL LOW (ref 0.57–1.00)
Glucose: 126 mg/dL — ABNORMAL HIGH (ref 70–99)
Potassium: 4.1 mmol/L (ref 3.5–5.2)
Sodium: 137 mmol/L (ref 134–144)
eGFR: 110 mL/min/1.73 (ref >59)

## 2023-11-26 ENCOUNTER — Inpatient Hospital Stay (HOSPITAL_COMMUNITY)
Admission: EM | Admit: 2023-11-26 | Discharge: 2023-12-03 | DRG: 234 | Disposition: A | Discharge status: Home / Self Care | Attending: Thoracic Surgery (Cardiothoracic Vascular Surgery) | Admitting: Thoracic Surgery (Cardiothoracic Vascular Surgery)

## 2023-11-26 ENCOUNTER — Emergency Department (HOSPITAL_COMMUNITY)
Admission: EM | Admit: 2023-11-26 | Discharge: 2023-11-26 | Disposition: A | Discharge status: Home / Self Care | Source: Home / Self Care

## 2023-11-26 ENCOUNTER — Encounter (HOSPITAL_COMMUNITY): Payer: Self-pay | Admitting: Emergency Medicine

## 2023-11-26 ENCOUNTER — Telehealth: Payer: Self-pay

## 2023-11-26 ENCOUNTER — Other Ambulatory Visit: Payer: Self-pay

## 2023-11-26 ENCOUNTER — Emergency Department (HOSPITAL_COMMUNITY)

## 2023-11-26 ENCOUNTER — Encounter (HOSPITAL_COMMUNITY): Payer: Self-pay

## 2023-11-26 DIAGNOSIS — E785 Hyperlipidemia, unspecified: Secondary | ICD-10-CM | POA: Diagnosis not present

## 2023-11-26 DIAGNOSIS — I10 Essential (primary) hypertension: Secondary | ICD-10-CM | POA: Diagnosis present

## 2023-11-26 DIAGNOSIS — Z91030 Bee allergy status: Secondary | ICD-10-CM | POA: Diagnosis not present

## 2023-11-26 DIAGNOSIS — I2510 Atherosclerotic heart disease of native coronary artery without angina pectoris: Secondary | ICD-10-CM | POA: Diagnosis present

## 2023-11-26 DIAGNOSIS — D72829 Elevated white blood cell count, unspecified: Secondary | ICD-10-CM | POA: Diagnosis not present

## 2023-11-26 DIAGNOSIS — I200 Unstable angina: Secondary | ICD-10-CM | POA: Diagnosis not present

## 2023-11-26 DIAGNOSIS — R072 Precordial pain: Principal | ICD-10-CM

## 2023-11-26 DIAGNOSIS — Z79899 Other long term (current) drug therapy: Secondary | ICD-10-CM | POA: Diagnosis not present

## 2023-11-26 DIAGNOSIS — I214 Non-ST elevation (NSTEMI) myocardial infarction: Principal | ICD-10-CM | POA: Diagnosis present

## 2023-11-26 DIAGNOSIS — E119 Type 2 diabetes mellitus without complications: Secondary | ICD-10-CM | POA: Diagnosis not present

## 2023-11-26 DIAGNOSIS — D259 Leiomyoma of uterus, unspecified: Secondary | ICD-10-CM | POA: Diagnosis present

## 2023-11-26 DIAGNOSIS — G4733 Obstructive sleep apnea (adult) (pediatric): Secondary | ICD-10-CM | POA: Diagnosis present

## 2023-11-26 DIAGNOSIS — Z794 Long term (current) use of insulin: Secondary | ICD-10-CM

## 2023-11-26 DIAGNOSIS — R7989 Other specified abnormal findings of blood chemistry: Secondary | ICD-10-CM | POA: Diagnosis not present

## 2023-11-26 DIAGNOSIS — K219 Gastro-esophageal reflux disease without esophagitis: Secondary | ICD-10-CM | POA: Diagnosis present

## 2023-11-26 DIAGNOSIS — E1169 Type 2 diabetes mellitus with other specified complication: Secondary | ICD-10-CM | POA: Diagnosis present

## 2023-11-26 DIAGNOSIS — Z6841 Body Mass Index (BMI) 40.0 and over, adult: Secondary | ICD-10-CM

## 2023-11-26 DIAGNOSIS — E7849 Other hyperlipidemia: Secondary | ICD-10-CM | POA: Diagnosis present

## 2023-11-26 DIAGNOSIS — R0789 Other chest pain: Secondary | ICD-10-CM | POA: Insufficient documentation

## 2023-11-26 DIAGNOSIS — R000 Tachycardia, unspecified: Secondary | ICD-10-CM | POA: Diagnosis present

## 2023-11-26 DIAGNOSIS — R5381 Other malaise: Secondary | ICD-10-CM | POA: Diagnosis present

## 2023-11-26 DIAGNOSIS — Z7985 Long-term (current) use of injectable non-insulin antidiabetic drugs: Secondary | ICD-10-CM | POA: Diagnosis not present

## 2023-11-26 DIAGNOSIS — Z833 Family history of diabetes mellitus: Secondary | ICD-10-CM

## 2023-11-26 DIAGNOSIS — Z90710 Acquired absence of both cervix and uterus: Secondary | ICD-10-CM

## 2023-11-26 DIAGNOSIS — Z9889 Other specified postprocedural states: Secondary | ICD-10-CM

## 2023-11-26 DIAGNOSIS — R8290 Unspecified abnormal findings in urine: Secondary | ICD-10-CM | POA: Diagnosis not present

## 2023-11-26 DIAGNOSIS — J9811 Atelectasis: Secondary | ICD-10-CM | POA: Diagnosis not present

## 2023-11-26 DIAGNOSIS — G4730 Sleep apnea, unspecified: Secondary | ICD-10-CM | POA: Diagnosis not present

## 2023-11-26 DIAGNOSIS — Z7982 Long term (current) use of aspirin: Secondary | ICD-10-CM

## 2023-11-26 DIAGNOSIS — E1143 Type 2 diabetes mellitus with diabetic autonomic (poly)neuropathy: Secondary | ICD-10-CM | POA: Diagnosis present

## 2023-11-26 DIAGNOSIS — Z951 Presence of aortocoronary bypass graft: Secondary | ICD-10-CM

## 2023-11-26 DIAGNOSIS — E66813 Obesity, class 3: Secondary | ICD-10-CM | POA: Diagnosis present

## 2023-11-26 DIAGNOSIS — R001 Bradycardia, unspecified: Secondary | ICD-10-CM | POA: Diagnosis present

## 2023-11-26 DIAGNOSIS — I25110 Atherosclerotic heart disease of native coronary artery with unstable angina pectoris: Secondary | ICD-10-CM | POA: Diagnosis not present

## 2023-11-26 DIAGNOSIS — E669 Obesity, unspecified: Secondary | ICD-10-CM | POA: Diagnosis not present

## 2023-11-26 DIAGNOSIS — Z981 Arthrodesis status: Secondary | ICD-10-CM | POA: Diagnosis not present

## 2023-11-26 DIAGNOSIS — Z01810 Encounter for preprocedural cardiovascular examination: Secondary | ICD-10-CM | POA: Diagnosis not present

## 2023-11-26 DIAGNOSIS — E1159 Type 2 diabetes mellitus with other circulatory complications: Secondary | ICD-10-CM | POA: Diagnosis not present

## 2023-11-26 DIAGNOSIS — Z8249 Family history of ischemic heart disease and other diseases of the circulatory system: Secondary | ICD-10-CM | POA: Diagnosis not present

## 2023-11-26 LAB — TROPONIN I (HIGH SENSITIVITY)
Troponin I (High Sensitivity): 8 ng/L (ref <18)
Troponin I (High Sensitivity): 7 ng/L (ref <18)
Troponin I (High Sensitivity): 9 ng/L (ref <18)
Troponin I (High Sensitivity): 30 ng/L — ABNORMAL HIGH (ref <18)

## 2023-11-26 LAB — CBC
HCT: 39.7 % (ref 36.0–46.0)
Hemoglobin: 13.6 g/dL (ref 12.0–15.0)
MCH: 29.6 pg (ref 26.0–34.0)
MCHC: 34.3 g/dL (ref 30.0–36.0)
MCV: 86.3 fL (ref 80.0–100.0)
Platelets: 349 K/uL (ref 150–400)
RBC: 4.6 MIL/uL (ref 3.87–5.11)
RDW: 12.7 % (ref 11.5–15.5)
WBC: 16.4 K/uL — ABNORMAL HIGH (ref 4.0–10.5)
nRBC: 0 % (ref 0.0–0.2)

## 2023-11-26 LAB — BASIC METABOLIC PANEL WITH GFR
Anion gap: 12 (ref 5–15)
BUN: 14 mg/dL (ref 6–20)
CO2: 20 mmol/L — ABNORMAL LOW (ref 22–32)
Calcium: 8.8 mg/dL — ABNORMAL LOW (ref 8.9–10.3)
Chloride: 102 mmol/L (ref 98–111)
Creatinine, Ser: 0.65 mg/dL (ref 0.44–1.00)
GFR, Estimated: >60 mL/min (ref >60)
Glucose, Bld: 231 mg/dL — ABNORMAL HIGH (ref 70–99)
Potassium: 4 mmol/L (ref 3.5–5.1)
Sodium: 134 mmol/L — ABNORMAL LOW (ref 135–145)

## 2023-11-26 MED ORDER — HEPARIN SODIUM (PORCINE) 5000 UNIT/ML IJ SOLN: 4000.0000 [IU] | Freq: Once | INTRAMUSCULAR | Status: DC

## 2023-11-26 MED ORDER — NITROGLYCERIN 2 % TD OINT: 0.5000 [in_us] | TOPICAL_OINTMENT | Freq: Four times a day (QID) | TRANSDERMAL | Status: DC | PRN

## 2023-11-26 MED ORDER — HEPARIN SODIUM (PORCINE) 5000 UNIT/ML IJ SOLN
4000.0000 [IU] | Freq: Once | INTRAMUSCULAR | Status: AC
  Administered 2023-11-26: 4000 [IU] via INTRAVENOUS
  Filled 2023-11-26: qty 1

## 2023-11-26 MED ORDER — ROSUVASTATIN CALCIUM 20 MG PO TABS
20.0000 mg | ORAL_TABLET | Freq: Every day | ORAL | Status: DC
  Administered 2023-11-26 – 2023-11-27 (×2): 20 mg via ORAL
  Filled 2023-11-26 (×2): qty 1

## 2023-11-26 MED ORDER — ONDANSETRON HCL 4 MG/2ML IJ SOLN: 4.0000 mg | Freq: Four times a day (QID) | INTRAMUSCULAR | Status: DC | PRN

## 2023-11-26 MED ORDER — CARVEDILOL 12.5 MG PO TABS
12.5000 mg | ORAL_TABLET | Freq: Two times a day (BID) | ORAL | Status: DC
  Administered 2023-11-26 – 2023-11-28 (×5): 12.5 mg via ORAL
  Filled 2023-11-26 (×5): qty 1

## 2023-11-26 MED ORDER — ACETAMINOPHEN 500 MG PO TABS
1000.0000 mg | ORAL_TABLET | Freq: Four times a day (QID) | ORAL | Status: DC | PRN
  Administered 2023-11-28: 1000 mg via ORAL
  Filled 2023-11-26 (×2): qty 2

## 2023-11-26 MED ORDER — HEPARIN (PORCINE) 25000 UT/250ML-% IV SOLN
1200.0000 [IU]/h | INTRAVENOUS | Status: DC
  Administered 2023-11-26: 900 [IU]/h via INTRAVENOUS

## 2023-11-26 MED ORDER — NITROGLYCERIN 0.4 MG SL SUBL
0.4000 mg | SUBLINGUAL_TABLET | SUBLINGUAL | Status: DC | PRN
  Administered 2023-11-26: 0.4 mg via SUBLINGUAL
  Filled 2023-11-26: qty 1

## 2023-11-26 MED ORDER — POLYETHYLENE GLYCOL 3350 17 G PO PACK: 17.0000 g | PACK | Freq: Every day | ORAL | Status: DC | PRN

## 2023-11-26 MED ORDER — INSULIN ASPART 100 UNIT/ML IJ SOLN
0.0000 [IU] | Freq: Three times a day (TID) | INTRAMUSCULAR | Status: DC
  Administered 2023-11-27: 1 [IU] via SUBCUTANEOUS
  Administered 2023-11-28: 2 [IU] via SUBCUTANEOUS
  Administered 2023-11-28: 3 [IU] via SUBCUTANEOUS
  Administered 2023-11-28: 1 [IU] via SUBCUTANEOUS

## 2023-11-26 MED ORDER — FAMOTIDINE 20 MG PO TABS: 20.0000 mg | ORAL_TABLET | Freq: Two times a day (BID) | ORAL | Status: DC | PRN

## 2023-11-26 MED ORDER — IOHEXOL 350 MG/ML SOLN
100.0000 mL | Freq: Once | INTRAVENOUS | Status: AC | PRN
  Administered 2023-11-26: 100 mL via INTRAVENOUS

## 2023-11-26 MED ORDER — ASPIRIN 81 MG PO CHEW
324.0000 mg | CHEWABLE_TABLET | Freq: Once | ORAL | Status: AC
  Administered 2023-11-26: 324 mg via ORAL
  Filled 2023-11-26: qty 4

## 2023-11-26 MED ORDER — SODIUM CHLORIDE 0.9% FLUSH
3.0000 mL | Freq: Two times a day (BID) | INTRAVENOUS | Status: DC
  Administered 2023-11-26 – 2023-11-27 (×2): 3 mL via INTRAVENOUS

## 2023-11-26 MED ORDER — ASPIRIN 81 MG PO CHEW
81.0000 mg | CHEWABLE_TABLET | Freq: Every day | ORAL | Status: DC
  Administered 2023-11-27 – 2023-11-28 (×2): 81 mg via ORAL
  Filled 2023-11-26 (×2): qty 1

## 2023-11-26 MED ORDER — INSULIN GLARGINE-YFGN 100 UNIT/ML ~~LOC~~ SOLN
8.0000 [IU] | Freq: Every day | SUBCUTANEOUS | Status: DC
  Administered 2023-11-27 – 2023-11-28 (×2): 8 [IU] via SUBCUTANEOUS
  Filled 2023-11-26 (×3): qty 0.08

## 2023-11-26 MED ORDER — MORPHINE SULFATE (PF) 2 MG/ML IV SOLN
2.0000 mg | Freq: Once | INTRAVENOUS | Status: AC
  Administered 2023-11-26: 2 mg via INTRAVENOUS
  Filled 2023-11-26: qty 1

## 2023-11-26 MED ORDER — NITROGLYCERIN 2 % TD OINT
0.5000 [in_us] | TOPICAL_OINTMENT | Freq: Four times a day (QID) | TRANSDERMAL | Status: DC
  Administered 2023-11-26 – 2023-11-27 (×4): 0.5 [in_us] via TOPICAL
  Filled 2023-11-26 (×4): qty 1

## 2023-11-26 MED ORDER — MELATONIN 3 MG PO TABS: 6.0000 mg | ORAL_TABLET | Freq: Every evening | ORAL | Status: DC | PRN

## 2023-11-26 MED ORDER — ALBUTEROL SULFATE (2.5 MG/3ML) 0.083% IN NEBU: 2.5000 mg | INHALATION_SOLUTION | RESPIRATORY_TRACT | Status: DC | PRN

## 2023-11-26 NOTE — ED Triage Notes (Signed)
 Pt bib POV c/o cp. Pt states she has had intermittent pain since September 2025. Pt had an echo completed that showed a block artery and scheduled stent for Wednesday.   Pt states the pain comes only at night CP is midsternum with pressure that radiates to the back. Pt has been taking muscle relaxer, baby aspirin, and pain medicine from [place].   Pt endorses nausea and sob

## 2023-11-26 NOTE — ED Triage Notes (Addendum)
 PT arrives via POV. States was seen and discharged earlier today for chest pain that has been intermittent since September. She states the pain has not gone away. PT AxOx4.

## 2023-11-26 NOTE — ED Notes (Signed)
 Pt took an 81 mg aspirin and muscle relaxer.

## 2023-11-26 NOTE — Telephone Encounter (Signed)
 Paged by patient to discuss chest pain. Of note, she is a 53yoF with T2DM, HTN, obesity, OSA who has had constant chest pain since September with CTA coronary showing severe stenosis of proximal LAD (70-99%); cardiac cath is planned for 11/29/23. She is actively in [facility] ED. She was seen earlier today in [facility] ED for chest pain; hsTi ruled out and ECG was stable from prior with CTA showing no dissection so she was discharged home. She took ibuprofen  as recommended and it did not help the pain so she re-presented.   She is calling me in order to discuss what she can do for the pain. I advised her to await her ER workup and communicate with the ED staff regarding her pain to ensure it is treated. With her severe stenosis, agree with repeat hsTi and serial ECG.

## 2023-11-26 NOTE — Discharge Instructions (Signed)
 You were evaluated in the emergency room for chest pain.  Your lab work and imaging did not show any significant abnormality.  Please follow-up with your cardiologist as scheduled.  If you experience any new or worsening symptoms please return emergency room.  You may use Tylenol  and ibuprofen  for pain.

## 2023-11-26 NOTE — ED Notes (Signed)
 Patient transported to X-ray

## 2023-11-26 NOTE — ED Provider Triage Note (Signed)
 Emergency Medicine Provider Triage Evaluation Note  Jasmin Gordon , a 54 y.o. female  was evaluated in triage.  Pt complains of chest pain.  Patient was discharged this morning for the same complaint, after she got home she began to experience a dull sensation in her chest, this very quickly became sharp/intense, prompting her to present to the emergency department for the second time today.  She reports that chest pain is still present but is very mild at this time, she also noted some radiation of the pain to her left arm.  She is scheduled for heart catheterization on Wednesday.  Review of Systems  Positive: As above Negative: As above  Physical Exam  BP (!) 165/98 (BP Location: Left Arm)   Pulse 100   Temp 98.4 F (36.9 C) (Oral)   Resp 18   SpO2 96%  Gen:   Awake, no distress   Resp:  Normal effort  MSK:   Moves extremities without difficulty  Other:    Medical Decision Making  Medically screening exam initiated at 4:54 PM.  Appropriate orders placed.  Jasmin Gordon was informed that the remainder of the evaluation will be completed by another provider, this initial triage assessment does not replace that evaluation, and the importance of remaining in the ED until their evaluation is complete.  Patient was seen and evaluated this morning, was discharged with flat troponins and largely unremarkable work-up, including CT dissection study. Will repeat troponins and EKG, patient is scheduled for heart catheterization on Wednesday.    Jasmin Gordon, [place] 11/26/23 [phone]

## 2023-11-26 NOTE — H&P (Signed)
 History and Physical    Jasmin Gordon FMW:[id number] DOB: 05/09/1970 DOA: 11/26/2023  PCP: [name], MD   Patient coming from: Home   Chief Complaint:  Chief Complaint  Patient presents with   Chest Pain    HPI:  Jasmin Gordon is a 53 y.o. female with hx of CAD, recent onset of anginal symptoms ~ 1 month ago, with recent coronary CT demonstrating a hemodynamically significant proximal LAD lesion plan for upcoming cardiac catheterization outpatient, additional history including morbid obesity, diabetes type 2, hypertension, hyperlipidemia, OSA, GERD, who presented with worsening chest pain.  Reports that she is typically had exertional or nighttime symptoms.  However over the past day symptoms have become significantly worse.  Had severe episodes of chest pressure radiating to her left back left arm and jaw since yesterday evening around 11 PM.  She had 7 discrete severe episodes for which she sought ED care.  Upon returning home she had another severe episode around 3:45 PM which was 20 out of 10 in severity per her report.  Symptoms lasted for about 15 minutes, the severe symptoms had resolved prior to receiving nitroglycerin.  While ambulating around the ER she had a flare of her chest pain.  No associated shortness of breath, diaphoresis.  Does have some associated nausea.  Is taking aspirin daily prior to admission.   Review of Systems:  ROS complete and negative except as marked above   Allergies  Allergen Reactions   Bee Venom Anaphylaxis    Prior to Admission medications   Medication Sig Start Date End Date Taking? Authorizing Provider  carvedilol (COREG) 12.5 MG tablet Take 1 tablet (12.5 mg total) by mouth 2 (two) times daily. 11/23/23  Yes Madireddy, Alean SAUNDERS, MD  insulin glargine (LANTUS) 100 UNIT/ML injection Inject 15 Units into the skin daily.   Yes [provider]  losartan (COZAAR) 25 MG tablet Take 1 tablet (25 mg total) by mouth daily. 11/13/23   Yes Madireddy, Alean SAUNDERS, MD  methocarbamol  (ROBAXIN ) 500 MG tablet Take 500 mg by mouth 3 (three) times daily. 11/25/23  Yes [provider]  OZEMPIC, 1 MG/DOSE, 4 MG/3ML SOPN Inject 1 mg into the skin once a week. Thursdays 07/02/23  Yes [provider]  rosuvastatin (CRESTOR) 10 MG tablet Take 1 tablet (10 mg total) by mouth daily. 11/23/23  Yes Madireddy, Alean SAUNDERS, MD    Past Medical History:  Diagnosis Date   Acute cystitis 08/11/2017   Anxiety and depression 08/11/2017   Atypical chest pain    BMI 40.0-44.9, adult (HCC)    Chest pain 08/11/2017   Constipation 08/14/2011   Cough 08/11/2017   Diabetes mellitus (HCC)    Diabetic gastroparesis associated with type 2 diabetes mellitus (HCC)    Dyspnea on exertion 08/11/2017   GERD (gastroesophageal reflux disease) 08/11/2017   Hoarseness 08/11/2017   HTN (hypertension) 08/09/2011   Intractable low back pain 08/07/2011   Left flank pain 08/11/2017   Lower urinary tract infectious disease 08/08/2011   IMO SNOMED Dx Update Oct 2024     Malaise and fatigue 08/11/2017   Morbid obesity (HCC)    Numbness of foot 08/11/2017   Obese    Oliguria 08/11/2017   Paresthesia 08/11/2017   Radiculopathy, lumbosacral region 08/08/2011   Sciatica 08/11/2017   Sinus tachycardia by electrocardiogram    Sleep apnea    Type 2 diabetes mellitus with hyperlipidemia [facility])     Past Surgical History:  Procedure Laterality Date  CESAREAN SECTION     x 4   hysterectomy surgery     lower back surgery  2013,2007   disc      reports that she has never smoked. She has never used smokeless tobacco. She reports current alcohol use. She reports that she does not use drugs.  Family History  Problem Relation Age of Onset   Diabetes Mother    CAD Mother    High blood pressure Maternal Grandmother    Diabetes Paternal Grandmother      Physical Exam: Vitals:   11/26/23 1900 11/26/23 1915 11/26/23 1930 11/26/23 1945  BP:       Pulse: 88 88 90 86  Resp: (!) 25 (!) 21 (!) 22 17  Temp:      TempSrc:      SpO2: 99% 100% 100% 100%    Gen: Awake, alert, NAD   CV: Regular, normal S1, S2, no murmurs  Resp: Normal WOB, CTAB  Abd: Flat, normoactive, nontender MSK: Symmetric, no edema  Skin: No rashes or lesions to exposed skin  Neuro: Alert and interactive  Psych: euthymic, appropriate    Data review:   Labs reviewed, notable for:   High-sensitivity Trop 8 -> 7 -> 9 -> 30  Bicarb 20, AG 12 Blood glucose 231 WBC 16   Micro:  Results for orders placed or performed during the hospital encounter of 08/07/11  Urine culture     Status: None   Collection Time: 08/09/11  5:30 AM   Specimen: Urine, Clean Catch  Result Value Ref Range Status   Specimen Description URINE, CLEAN CATCH  Final   Special Requests NONE  Final   Culture  Setup Time 08/09/2011 06:13  Final   Colony Count 20,OOO COLONIES/ML  Final   Culture   Final    Multiple bacterial morphotypes present, none predominant. Suggest appropriate recollection if clinically indicated.   Report Status 08/10/2011 FINAL  Final  Surgical pcr screen     Status: Abnormal   Collection Time: 08/11/11 12:48 PM   Specimen: Nasal Mucosa; Nasal Swab  Result Value Ref Range Status   MRSA, PCR NEGATIVE NEGATIVE Final   Staphylococcus aureus POSITIVE (A) NEGATIVE Final    Comment:        The Xpert SA Assay (FDA approved for NASAL specimens only), is one component of a comprehensive surveillance program.  It is not intended to diagnose infection nor to guide or monitor treatment.    Imaging reviewed:  CT Angio Chest/Abd/Pel for Dissection W and/or Wo Contrast Result Date: 11/26/2023 EXAM: CTA CHEST, ABDOMEN AND PELVIS WITH AND WITHOUT CONTRAST 11/26/2023 09:33:54 AM TECHNIQUE: CTA of the chest was performed with and without the administration of 100 mL of intravenous contrast (iohexol (OMNIPAQUE) 350 MG/ML injection 100 mL IOHEXOL 350 MG/ML SOLN). CTA of the  abdomen and pelvis was performed with and without the administration of 100 mL of intravenous contrast (iohexol (OMNIPAQUE) 350 MG/ML injection 100 mL IOHEXOL 350 MG/ML SOLN). Multiplanar reformatted images are provided for review. MIP images are provided for review. Automated exposure control, iterative reconstruction, and/or weight based adjustment of the mA/kV was utilized to reduce the radiation dose to as low as reasonably achievable. COMPARISON: None available. CLINICAL HISTORY: Acute aortic syndrome (AAS) suspected. Pt complains of tearing chest pain that radiates to the back. No infectious symptoms. FINDINGS: VASCULATURE: AORTA: Noncontrast CT imaging demonstrates no intramural hematoma within the thoracic aorta. Contrast enhanced series demonstrates no aortic dissection or aneurysm. No abdominal aortic aneurysm.  PULMONARY ARTERIES: No pulmonary embolism within the limits of this exam. GREAT VESSELS OF AORTIC ARCH: Great vessels are normal. No dissection. No arterial occlusion or significant stenosis. CELIAC TRUNK: No acute finding. No occlusion or significant stenosis. SUPERIOR MESENTERIC ARTERY: No acute finding. No occlusion or significant stenosis. INFERIOR MESENTERIC ARTERY: No acute finding. No occlusion or significant stenosis. RENAL ARTERIES: No acute finding. No occlusion or significant stenosis. ILIAC ARTERIES: No acute finding. No occlusion or significant stenosis. CHEST: MEDIASTINUM: No mediastinal lymphadenopathy. The heart and pericardium demonstrate no acute abnormality. No pericardial fluid. No mediastinal hematoma. LUNGS AND PLEURA: The lungs are without acute process. No focal consolidation or pulmonary edema. No pulmonary infarction. No pneumonia. No pleural fluid. No pneumothorax. Incidental finding of azygos lobe in the right lung. THORACIC BONES AND SOFT TISSUES: No acute bone or soft tissue abnormality. ABDOMEN AND PELVIS: LIVER: The liver is unremarkable. GALLBLADDER AND BILE DUCTS:  Gallbladder is unremarkable. No biliary ductal dilatation. SPLEEN: The spleen is unremarkable. PANCREAS: The pancreas is unremarkable. ADRENAL GLANDS: Bilateral adrenal glands demonstrate no acute abnormality. KIDNEYS, URETERS AND BLADDER: No stones in the kidneys or ureters. No hydronephrosis. No perinephric or periureteral stranding. Urinary bladder is unremarkable. GI AND BOWEL: Stomach and duodenal sweep demonstrate no acute abnormality. There is no bowel obstruction. No abnormal bowel wall thickening or distension. REPRODUCTIVE: Within the pelvis there is a rounded 5.3 cm mass superior to the fundus of the uterus. Findings most consistent with exophytic leiomyoma. PERITONEUM AND RETROPERITONEUM: No ascites or free air. LYMPH NODES: No lymphadenopathy. ABDOMINAL BONES AND SOFT TISSUES: Posterior lumbar fusion and posterior lumbar decompression. No acute soft tissue abnormality. IMPRESSION: 1. No evidence of acute aortic syndrome, including dissection or intramural hematoma. 2. 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 3. Posterior lumbar fusion without complication. Electronically signed by: Norleen Boxer MD 11/26/2023 10:10 AM EDT RP Workstation: [id number]   DG Chest 2 View Result Date: 11/26/2023 EXAM: 2 VIEW(S) XRAY OF THE CHEST 11/26/2023 05:02:01 AM COMPARISON: None available. CLINICAL HISTORY: CP. 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 now CP midsternum with pressure that radiates to the back. FINDINGS: LUNGS AND PLEURA: No focal pulmonary opacity. No pulmonary edema. No pleural effusion. No pneumothorax. HEART AND MEDIASTINUM: No acute abnormality of the cardiac and mediastinal silhouettes. BONES AND SOFT TISSUES: No acute osseous abnormality. IMPRESSION: 1. No acute cardiopulmonary process identified. Electronically signed by: Waddell Calk MD 11/26/2023 05:24 AM EDT RP Workstation: [id number]   Reason outpatient  coronary CT 10/21 IMPRESSION: 1. Coronary calcium score of 83. This was 27 percentile for age and sex matched control.   2. Normal coronary origin with right dominance.   3. CAD-RADS 4 Severe stenosis. (70-99%) prox LAD. Cardiac catheterization or CT FFR is recommended. Consider symptom-guided anti-ischemic pharmacotherapy as well as risk factor modification per guideline directed care.   EKG:  Personally reviewed sinus rhythm, PRWP, biphasic T wave V2, ST depression and T wave inversion laterally -> these changes are new from prior EKG  ED Course:  Treated with nitroglycerin, loaded with aspirin and, was planned to start on heparin .  EDP consulted with cardiology who will consult on the patient and recommended holding off on heparin  drip until evaluated.    Assessment/Plan:  53 y.o. female with hx CAD, recent onset of anginal symptoms ~ 1 month ago, with recent coronary CT demonstrating a hemodynamically significant proximal LAD lesion plan for upcoming cardiac catheterization outpatient, additional history  including morbid obesity, diabetes type 2, hypertension, hyperlipidemia, OSA, GERD, who presented with worsening chest pain.     Chest pain, concern for unstable angina/developing NSTEMI 1 month of exertional anginal symptoms and recent outpatient coronary CTA demonstrating a severe proximal LAD stenosis.  Pattern of chest pain worsening over past 24 hr with more severe discrete episodes and exertional symptoms. high-sensitivity troponin was negative but the last was 30.  EKG has new changes since 10/13 with ST depression and T wave inversion laterally, biphasic T waves anteriorly and poor R wave progression.  Was planned for outpatient cath on 10/29.  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  Chronic medical problems: Morbid obesity: On GLP1 OP  Diabetes type 2: OP regimen lantus 15 units daily (although not taken this week), Ozempic; inpatient reduce basal to 8 units daily while NPO, adjust as needed. Add SSI for very sensitive.  Hypertension: Continue BBlocker per above  Hyperlipidemia: Statin per above  OSA: CPAP  GERD: Famotidine prn    There is no height or weight on file to calculate BMI.    DVT prophylaxis:  IV heparin  gtts Code Status:  Full Code Diet:  Diet Orders (From admission, onward)    None      Family Communication:  Yes spoke with daughter, sister at bedside  Consults:  Cardiology   Admission status:   Inpatient, Step Down Unit  Severity of Illness: The appropriate patient status for this patient is INPATIENT. Inpatient status is judged to be reasonable and necessary in order to provide the required intensity of service to ensure the patient's safety. The patient's presenting symptoms, physical exam findings, and initial radiographic and laboratory data in the context of their chronic comorbidities is felt to place them at high risk for further clinical deterioration. Furthermore, it is not anticipated that the patient will be medically stable for discharge from the hospital within 2 midnights of admission.   * I certify that at the point of admission it is my clinical judgment that the patient will require inpatient hospital care spanning beyond 2 midnights from the point of admission due to high intensity of service, high risk for further deterioration and high frequency of surveillance required.*   Dorn Dawson, MD [facility]  How to contact the [facility] Attending or Consulting provider 7A - 7P or covering provider during after hours 7P -7A, for this patient.  Check the care team in [facility]  and look for a) attending/consulting [facility] provider listed and b) the [facility] team listed Log into [email or web address] and use 's universal password to access. If you do not have the password, please contact the hospital operator. Locate the [facility] provider you are looking for under [facility] and page to a number that you can be directly reached. If you still have difficulty reaching the provider, please page the [facility] (Director on Call) for the Hospitalists listed on amion for assistance.  11/26/2023, 8:34 PM

## 2023-11-26 NOTE — ED Provider Triage Note (Addendum)
 Emergency Medicine Provider Triage Evaluation Note  Jasmin Gordon , a 53 y.o. female  was evaluated in triage.  Pt complains of tearing chest pain that radiates to the back.  No infectious symptoms.  Review of Systems  Positive:  Negative:   Physical Exam  BP (!) 134/93 (BP Location: Left Wrist)   Pulse 95   Temp 98 F (36.7 C)   Resp 20   Ht 5' 2 (1.575 m)   Wt 109.4 kg   SpO2 93%   BMI 44.12 kg/m  Gen:   Awake, no distress   Resp:  Normal effort  MSK:   Moves extremities without difficulty  Other:  Reproducible chest wall tenderness  Medical Decision Making  Medically screening exam initiated at 8:08 AM.  Appropriate orders placed.  Jasmin Gordon was informed that the remainder of the evaluation will be completed by another provider, this initial triage assessment does not replace that evaluation, and the importance of remaining in the ED until their evaluation is complete.  Patient hypertensive upon presentation although she appears calm with her describing symptoms will obtain dedicated dissection study.   Donnajean Lynwood DEL, PA-C 11/26/23 0809    Donnajean Lynwood DEL, PA-C 11/26/23 [phone]

## 2023-11-26 NOTE — Consult Note (Signed)
 Cardiology Consultation   Patient ID: Jasmin Gordon MRN: [id number]; DOB: [birth year]  Admit date: 11/26/2023 Date of Consult: 11/26/2023  PCP:  [name], MD   [facility] Providers Cardiologist:  None   { Click here to update MD or APP on Care Team, Refresh:1}     Patient Profile: Jasmin Gordon is a 53 y.o. female with a hx of  T2DM, HTN, obesity, OSA who is being seen 11/26/2023 for the evaluation of chest pain at the request of Dr. Zackowski.  History of Present Illness: Jasmin Gordon reports constant chest pain since 11PM on Saturday night which begun when she was just lying down (not active) and has not remitted since then. She did report an episode on Saturday as well and on Friday (went to [place] at that time). Describes pain as sharp left sided pain that radiates down her arm and to her back and jaw. The pain has improved since being in the ED though is now dull pressure-like sensation. Pain did worsen with exertion here in the ED when she walked to the bathroom. Has stable dyspnea on exertion (started in 10/2023). Reports nausea with chest pain. Chest pain is currently 4/10; at worst it was 20/10. Denies BLE edema (though does note pedal edema), syncope, fevers, vomiting, loss of PO intake, focal weakness, palpitations, orthopnea.   No history of bleeding; reports she did take 1 baby aspirin and 3 ibuprofen  early this afternoon.   Past Medical History:  Diagnosis Date   Acute cystitis 08/11/2017   Anxiety and depression 08/11/2017   Atypical chest pain    BMI 40.0-44.9, adult (HCC)    Chest pain 08/11/2017   Constipation 08/14/2011   Cough 08/11/2017   Diabetes mellitus (HCC)    Diabetic gastroparesis associated with type 2 diabetes mellitus (HCC)    Dyspnea on exertion 08/11/2017   GERD (gastroesophageal reflux disease) 08/11/2017   Hoarseness 08/11/2017   HTN (hypertension) 08/09/2011   Intractable low back pain 08/07/2011   Left flank pain 08/11/2017    Lower urinary tract infectious disease 08/08/2011   IMO SNOMED Dx Update Oct 2024     Malaise and fatigue 08/11/2017   Morbid obesity (HCC)    Numbness of foot 08/11/2017   Obese    Oliguria 08/11/2017   Paresthesia 08/11/2017   Radiculopathy, lumbosacral region 08/08/2011   Sciatica 08/11/2017   Sinus tachycardia by electrocardiogram    Sleep apnea    Type 2 diabetes mellitus with hyperlipidemia [facility])     Past Surgical History:  Procedure Laterality Date   CESAREAN SECTION     x 4   hysterectomy surgery     lower back surgery  2013,2007   disc      Home Medications:  Prior to Admission medications   Medication Sig Start Date End Date Taking? Authorizing Provider  carvedilol (COREG) 12.5 MG tablet Take 1 tablet (12.5 mg total) by mouth 2 (two) times daily. 11/23/23  Yes Madireddy, Alean SAUNDERS, MD  insulin glargine (LANTUS) 100 UNIT/ML injection Inject 15 Units into the skin daily.   Yes [provider]  losartan (COZAAR) 25 MG tablet Take 1 tablet (25 mg total) by mouth daily. 11/13/23  Yes Madireddy, Alean SAUNDERS, MD  methocarbamol  (ROBAXIN ) 500 MG tablet Take 500 mg by mouth 3 (three) times daily. 11/25/23  Yes [provider]  OZEMPIC, 1 MG/DOSE, 4 MG/3ML SOPN Inject 1 mg into the skin once a week. Thursdays 07/02/23  Yes [provider]  rosuvastatin (  CRESTOR) 10 MG tablet Take 1 tablet (10 mg total) by mouth daily. 11/23/23  Yes Madireddy, Alean SAUNDERS, MD    Scheduled Meds:  [START ON 11/27/2023] aspirin  81 mg Oral Daily   carvedilol  12.5 mg Oral BID   [START ON 11/27/2023] insulin aspart  0-6 Units Subcutaneous TID WC   [START ON 11/27/2023] insulin glargine-yfgn  8 Units Subcutaneous Daily   nitroGLYCERIN  0.5 inch Topical Q6H   rosuvastatin  20 mg Oral Daily   sodium chloride  flush  3 mL Intravenous Q12H   Continuous Infusions:  heparin      PRN Meds: acetaminophen , albuterol, famotidine, melatonin, ondansetron  (ZOFRAN ) IV, polyethylene  glycol  Allergies:    Allergies  Allergen Reactions   Bee Venom Anaphylaxis    Social History:   Social History   Socioeconomic History   Marital status: Married    Spouse name: Not on file   Number of children: 5   Years of education: college   Highest education level: Not on file  Occupational History   Occupation: Company Secretary: [employer]  Tobacco Use   Smoking status: Never   Smokeless tobacco: Never  Substance and Sexual Activity   Alcohol use: Yes    Comment: Patient drinks 1-2 drinks a year.   Drug use: No   Sexual activity: Not on file  Other Topics Concern   Not on file  Social History Narrative   Not on file   Social Drivers of Health   Financial Resource Strain: Low Risk  (10/21/2023)   Received from [facility]   Overall Financial Resource Strain (CARDIA)    How hard is it for you to pay for the very basics like food, housing, medical care, and heating?: Not very hard  Food Insecurity: No Food Insecurity (10/21/2023)   Received from [facility]   Hunger Vital Sign    Within the past 12 months, you worried that your food would run out before you got the money to buy more.: Never true    Within the past 12 months, the food you bought just didn't last and you didn't have money to get more.: Never true  Transportation Needs: No Transportation Needs (10/21/2023)   Received from [facility] - Transportation    In the past 12 months, has lack of transportation kept you from medical appointments or from getting medications?: No    In the past 12 months, has lack of transportation kept you from meetings, work, or from getting things needed for daily living?: No  Physical Activity: Insufficiently Active (10/21/2023)   Received from [facility]   Exercise Vital Sign    On average, how many days per week do you engage in moderate to strenuous exercise (like a brisk walk)?: 1 day    On average, how many minutes do you engage in  exercise at this level?: 60 min  Stress: No Stress Concern Present (10/21/2023)   Received from [facility] - Occupational Stress Questionnaire    Do you feel stress - tense, restless, nervous, or anxious, or unable to sleep at night because your mind is troubled all the time - these days?: Only a little  Social Connections: Socially Integrated (10/21/2023)   Received from [facility]   Social Network    How would you rate your social network (family, work, friends)?: Good participation with social networks  Intimate Partner Violence: Not At  Risk (10/21/2023)   Received from [facility]   HITS    Over the last 12 months how often did your partner physically hurt you?: Never    Over the last 12 months how often did your partner insult you or talk down to you?: Never    Over the last 12 months how often did your partner threaten you with physical harm?: Never    Over the last 12 months how often did your partner scream or curse at you?: Never    Family History:   *** Family History  Problem Relation Age of Onset   Diabetes Mother    CAD Mother    High blood pressure Maternal Grandmother    Diabetes Paternal Grandmother      ROS:  Please see the history of present illness.  All other ROS reviewed and negative.     Physical Exam/Data: Vitals:   11/26/23 1915 11/26/23 1930 11/26/23 1945 11/26/23 2030  BP:    (!) 146/85  Pulse: 88 90 86 84  Resp: (!) 21 (!) 22 17 15   Temp:      TempSrc:      SpO2: 100% 100% 100% 100%   No intake or output data in the 24 hours ending 11/26/23 2150    11/26/2023    4:39 AM 11/23/2023   10:50 AM 11/13/2023    4:06 PM  Last 3 Weights  Weight (lbs) 241 lb 3.2 oz 241 lb 3.2 oz 233 lb 9.6 oz  Weight (kg) 109.408 kg 109.408 kg 105.96 kg     There is no height or weight on file to calculate BMI.  General:  Well nourished, well developed, in no acute distress*** HEENT: normal Neck: no JVD Vascular:  No carotid bruits; Distal pulses 2+ bilaterally Cardiac:  normal S1, S2; RRR; no murmur *** Lungs:  clear to auscultation bilaterally, no wheezing, rhonchi or rales  Abd: soft, nontender, no hepatomegaly  Ext: no edema Musculoskeletal:  No deformities, BUE and BLE strength normal and equal Skin: warm and dry  Neuro:  CNs 2-12 intact, no focal abnormalities noted Psych:  Normal affect   EKG:  The EKG was personally reviewed and demonstrates:  *** Telemetry:  Telemetry was personally reviewed and demonstrates:  ***  Relevant CV Studies: ***  Laboratory Data: High Sensitivity Troponin:   Recent Labs  Lab 11/26/23 0452 11/26/23 0752 11/26/23 1650 11/26/23 1850  TROPONINIHS 8 7 9  30*     Chemistry Recent Labs  Lab 11/21/23 1004 11/23/23 1202 11/26/23 0452  NA  --  137 134*  K  --  4.1 4.0  CL  --  101 102  CO2  --  22 20*  GLUCOSE  --  126* 231*  BUN  --  14 14  CREATININE 0.60 0.56* 0.65  CALCIUM  --  9.1 8.8*  GFRNONAA  --   --  >60  ANIONGAP  --   --  12    No results for input(s): PROT, ALBUMIN, AST, ALT, ALKPHOS, BILITOT in the last 168 hours. Lipids No results for input(s): CHOL, TRIG, HDL, LABVLDL, LDLCALC, CHOLHDL in the last 168 hours.  Hematology Recent Labs  Lab 11/23/23 1202 11/26/23 0452  WBC 12.0* 16.4*  RBC 4.85 4.60  HGB 14.1 13.6  HCT 43.7 39.7  MCV 90 86.3  MCH 29.1 29.6  MCHC 32.3 34.3  RDW 12.7 12.7  PLT 374 349   Thyroid No results for input(s): TSH, FREET4 in the last 168 hours.  BNPNo results for input(s): BNP, PROBNP in the last 168 hours.  DDimer No results for input(s): DDIMER in the last 168 hours.  Radiology/Studies:  CT Angio Chest/Abd/Pel for Dissection W and/or Wo Contrast Result Date: 11/26/2023 EXAM: CTA CHEST, ABDOMEN AND PELVIS WITH AND WITHOUT CONTRAST 11/26/2023 09:33:54 AM TECHNIQUE: CTA of the chest was performed with and without the administration of 100 mL of intravenous contrast  (iohexol (OMNIPAQUE) 350 MG/ML injection 100 mL IOHEXOL 350 MG/ML SOLN). CTA of the abdomen and pelvis was performed with and without the administration of 100 mL of intravenous contrast (iohexol (OMNIPAQUE) 350 MG/ML injection 100 mL IOHEXOL 350 MG/ML SOLN). Multiplanar reformatted images are provided for review. MIP images are provided for review. Automated exposure control, iterative reconstruction, and/or weight based adjustment of the mA/kV was utilized to reduce the radiation dose to as low as reasonably achievable. COMPARISON: None available. CLINICAL HISTORY: Acute aortic syndrome (AAS) suspected. Pt complains of tearing chest pain that radiates to the back. No infectious symptoms. FINDINGS: VASCULATURE: AORTA: Noncontrast CT imaging demonstrates no intramural hematoma within the thoracic aorta. Contrast enhanced series demonstrates no aortic dissection or aneurysm. No abdominal aortic aneurysm. PULMONARY ARTERIES: No pulmonary embolism within the limits of this exam. GREAT VESSELS OF AORTIC ARCH: Great vessels are normal. No dissection. No arterial occlusion or significant stenosis. CELIAC TRUNK: No acute finding. No occlusion or significant stenosis. SUPERIOR MESENTERIC ARTERY: No acute finding. No occlusion or significant stenosis. INFERIOR MESENTERIC ARTERY: No acute finding. No occlusion or significant stenosis. RENAL ARTERIES: No acute finding. No occlusion or significant stenosis. ILIAC ARTERIES: No acute finding. No occlusion or significant stenosis. CHEST: MEDIASTINUM: No mediastinal lymphadenopathy. The heart and pericardium demonstrate no acute abnormality. No pericardial fluid. No mediastinal hematoma. LUNGS AND PLEURA: The lungs are without acute process. No focal consolidation or pulmonary edema. No pulmonary infarction. No pneumonia. No pleural fluid. No pneumothorax. Incidental finding of azygos lobe in the right lung. THORACIC BONES AND SOFT TISSUES: No acute bone or soft tissue abnormality.  ABDOMEN AND PELVIS: LIVER: The liver is unremarkable. GALLBLADDER AND BILE DUCTS: Gallbladder is unremarkable. No biliary ductal dilatation. SPLEEN: The spleen is unremarkable. PANCREAS: The pancreas is unremarkable. ADRENAL GLANDS: Bilateral adrenal glands demonstrate no acute abnormality. KIDNEYS, URETERS AND BLADDER: No stones in the kidneys or ureters. No hydronephrosis. No perinephric or periureteral stranding. Urinary bladder is unremarkable. GI AND BOWEL: Stomach and duodenal sweep demonstrate no acute abnormality. There is no bowel obstruction. No abnormal bowel wall thickening or distension. REPRODUCTIVE: Within the pelvis there is a rounded 5.3 cm mass superior to the fundus of the uterus. Findings most consistent with exophytic leiomyoma. PERITONEUM AND RETROPERITONEUM: No ascites or free air. LYMPH NODES: No lymphadenopathy. ABDOMINAL BONES AND SOFT TISSUES: Posterior lumbar fusion and posterior lumbar decompression. No acute soft tissue abnormality. IMPRESSION: 1. No evidence of acute aortic syndrome, including dissection or intramural hematoma. 2. 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 3. Posterior lumbar fusion without complication. Electronically signed by: Norleen Boxer MD 11/26/2023 10:10 AM EDT RP Workstation: [id number]   DG Chest 2 View Result Date: 11/26/2023 EXAM: 2 VIEW(S) XRAY OF THE CHEST 11/26/2023 05:02:01 AM COMPARISON: None available. CLINICAL HISTORY: CP. 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 now CP midsternum with pressure that radiates to the back. FINDINGS: LUNGS AND PLEURA: No focal pulmonary opacity. No pulmonary edema. No pleural effusion. No pneumothorax. HEART AND MEDIASTINUM: No acute abnormality of the cardiac  and mediastinal silhouettes. BONES AND SOFT TISSUES: No acute osseous abnormality. IMPRESSION: 1. No acute cardiopulmonary process identified. Electronically signed by: Waddell Calk  MD 11/26/2023 05:24 AM EDT RP Workstation: [id number]     Assessment and Plan: NSTEMI Presents with  - ASA 324mg  load given in ED, then 81mg  ASA daily - Heparin  per ACS protocol - Continue *** - NPOpMN for likely LHC tomorrow   Risk Assessment/Risk Scores: {Complete the following score calculators/questions to meet required metrics.  Press F2         :[id number]}   TIMI Risk Score for Unstable Angina or Non-ST Elevation MI:   The patient's TIMI risk score is 4, which indicates a 20% risk of all cause mortality, new or recurrent myocardial infarction or need for urgent revascularization in the next 14 days.{ Click here to calculate score  REFRESH Note before signing  :1}         For questions or updates, please contact [facility] Please consult [email or web address] for contact info under    {TIP  Split Shared Billing  Do NOT delete any part of this including brackets If split shared billing is based upon MDM, disregard If billing will be based upon TIME you MUST document the number of minutes and a detailed list of what was done in that time in the following format Example - I spent ** minutes seeing this patient. During that time I reviewed their history, evaluated their symptoms, reviewed available labs, EKGs, studies, performed an exam and formulated an assessment and plan   :1} {Select this only if you need to document critical care time (Optional):[phone]} Signed, Chiquita ONEIDA Sauers, MD  11/26/2023 9:50 PM

## 2023-11-26 NOTE — Progress Notes (Signed)
 ANTICOAGULATION CONSULT NOTE  Pharmacy Consult for Heparin  Indication: chest pain/ACS  Allergies  Allergen Reactions   Bee Venom Anaphylaxis    Patient Measurements:   Heparin  Dosing Weight: 76.7 kg  Vital Signs: Temp: 98.4 F (36.9 C) (10/26 1645) Temp Source: Oral (10/26 1645) BP: 146/85 (10/26 2030) Pulse Rate: 84 (10/26 2030)  Labs: Recent Labs    11/26/23 0452 11/26/23 0752 11/26/23 1650 11/26/23 1850  HGB 13.6  --   --   --   HCT 39.7  --   --   --   PLT 349  --   --   --   CREATININE 0.65  --   --   --   TROPONINIHS 8 7 9  30*    Estimated Creatinine Clearance: 95.8 mL/min (by C-G formula based on SCr of 0.65 mg/dL).   Medical History: Past Medical History:  Diagnosis Date   Acute cystitis 08/11/2017   Anxiety and depression 08/11/2017   Atypical chest pain    BMI 40.0-44.9, adult (HCC)    Chest pain 08/11/2017   Constipation 08/14/2011   Cough 08/11/2017   Diabetes mellitus (HCC)    Diabetic gastroparesis associated with type 2 diabetes mellitus (HCC)    Dyspnea on exertion 08/11/2017   GERD (gastroesophageal reflux disease) 08/11/2017   Hoarseness 08/11/2017   HTN (hypertension) 08/09/2011   Intractable low back pain 08/07/2011   Left flank pain 08/11/2017   Lower urinary tract infectious disease 08/08/2011   IMO SNOMED Dx Update Oct 2024     Malaise and fatigue 08/11/2017   Morbid obesity (HCC)    Numbness of foot 08/11/2017   Obese    Oliguria 08/11/2017   Paresthesia 08/11/2017   Radiculopathy, lumbosacral region 08/08/2011   Sciatica 08/11/2017   Sinus tachycardia by electrocardiogram    Sleep apnea    Type 2 diabetes mellitus with hyperlipidemia (HCC)     Medications:  (Not in a hospital admission)  Scheduled:  Infusions:  PRN: nitroGLYCERIN  Assessment: Jasmin Gordon with a history of CAD. Patient is presenting with chest pain. Heparin  per pharmacy consult placed for chest pain/ACS.  Patient is not on anticoagulation prior to  arrival.  Hgb 13.6; plt 349  Goal of Therapy:  Heparin  level 0.3-0.7 units/ml Monitor platelets by anticoagulation protocol: Yes   Plan:  IV heparin  4000 units IV bolus already given at time of pharmacist consult placed in the ED Start heparin  infusion at 900 units/hr Check anti-Xa level in 6 hours and daily while on heparin  Continue to monitor H&H and platelets  Dorn Buttner, PharmD, BCPS 11/26/2023 8:48 PM ED Clinical Pharmacist -  [phone]

## 2023-11-26 NOTE — ED Provider Notes (Addendum)
 [place] EMERGENCY DEPARTMENT AT [facility] Provider Note   CSN: [id number] Arrival date & time: 11/26/23  1639     Patient presents with: Chest Pain   Jasmin Gordon is a 53 y.o. female.   Patient seen earlier today had troponins x 2 without significant change.  CT angio dissection study of chest abdomen and pelvis.  Patient had CT cardiac that showed concerns for a lesion.  Patient has had constant chest pain since Friday.  Prior to that it has been intermittent to most of September.  With that is what cardiology led to the CT cardiac scan.  And she has a cardiac cath pending.  Patient has a history of diabetes hypertension obesity hyperlipidemia patient was evaluated by cardiology as mentioned and had a CT coronary which demonstrated a proximal LAD lesion that is hemodynamically significant.  Echo demonstrated left ventricular ejection fracture of 60 to 65%.  Patient is back pain is the same at some substernal kind of moves to the back and moves to the left shoulder area no significant change in the pain compared today other than maybe a bit more intense.  Repeat EKG done here this evening without any significant change.       Prior to Admission medications   Medication Sig Start Date End Date Taking? Authorizing Provider  carvedilol (COREG) 12.5 MG tablet Take 1 tablet (12.5 mg total) by mouth 2 (two) times daily. 11/23/23   Madireddy, Alean SAUNDERS, MD  insulin glargine (LANTUS) 100 UNIT/ML injection Inject 15 Units into the skin daily.    [provider]  losartan (COZAAR) 25 MG tablet Take 1 tablet (25 mg total) by mouth daily. 11/13/23   Madireddy, Sreedhar R, MD  OZEMPIC, 1 MG/DOSE, 4 MG/3ML SOPN Inject 1 mg into the skin once a week. 07/02/23   [provider]  rosuvastatin (CRESTOR) 10 MG tablet Take 1 tablet (10 mg total) by mouth daily. 11/23/23   Madireddy, Alean SAUNDERS, MD    Allergies: Bee venom    Review of Systems  Constitutional:   Negative for chills and fever.  HENT:  Negative for ear pain and sore throat.   Eyes:  Negative for pain and visual disturbance.  Respiratory:  Negative for cough and shortness of breath.   Cardiovascular:  Positive for chest pain. Negative for palpitations.  Gastrointestinal:  Negative for abdominal pain and vomiting.  Genitourinary:  Negative for dysuria and hematuria.  Musculoskeletal:  Negative for arthralgias and back pain.  Skin:  Negative for color change and rash.  Neurological:  Negative for seizures and syncope.  All other systems reviewed and are negative.   Updated Vital Signs BP (!) 165/98 (BP Location: Left Arm)   Pulse 100   Temp 98.4 F (36.9 C) (Oral)   Resp 18   SpO2 96%   Physical Exam Vitals and nursing note reviewed.  Constitutional:      General: She is not in acute distress.    Appearance: Normal appearance. She is well-developed.  HENT:     Head: Normocephalic and atraumatic.     Mouth/Throat:     Mouth: Mucous membranes are moist.  Eyes:     Extraocular Movements: Extraocular movements intact.     Conjunctiva/sclera: Conjunctivae normal.     Pupils: Pupils are equal, round, and reactive to light.  Cardiovascular:     Rate and Rhythm: Normal rate and regular rhythm.     Heart sounds: No murmur heard. Pulmonary:  Effort: Pulmonary effort is normal. No respiratory distress.     Breath sounds: Normal breath sounds.  Abdominal:     Palpations: Abdomen is soft.     Tenderness: There is no abdominal tenderness.  Musculoskeletal:        General: No swelling.     Cervical back: Normal range of motion and neck supple.  Skin:    General: Skin is warm and dry.     Capillary Refill: Capillary refill takes less than 2 seconds.  Neurological:     General: No focal deficit present.     Mental Status: She is alert and oriented to person, place, and time.  Psychiatric:        Mood and Affect: Mood normal.     (all labs ordered are listed, but only  abnormal results are displayed) Labs Reviewed  TROPONIN I (HIGH SENSITIVITY)    EKG: EKG Interpretation Date/Time:  Sunday November 26 2023 16:55:03 EDT Ventricular Rate:  100 PR Interval:  168 QRS Duration:  76 QT Interval:  356 QTC Calculation: 459 R Axis:   6  Text Interpretation: Normal sinus rhythm Possible Anterior infarct , age undetermined Abnormal ECG When compared with ECG of 26-Nov-2023 04:32, PREVIOUS ECG IS PRESENT No significant change since last tracing Confirmed by Moniqua Engebretsen [phone]) on 11/26/2023 5:27:05 PM  Radiology: CT Angio Chest/Abd/Pel for Dissection W and/or Wo Contrast Result Date: 11/26/2023 EXAM: CTA CHEST, ABDOMEN AND PELVIS WITH AND WITHOUT CONTRAST 11/26/2023 09:33:54 AM TECHNIQUE: CTA of the chest was performed with and without the administration of 100 mL of intravenous contrast (iohexol (OMNIPAQUE) 350 MG/ML injection 100 mL IOHEXOL 350 MG/ML SOLN). CTA of the abdomen and pelvis was performed with and without the administration of 100 mL of intravenous contrast (iohexol (OMNIPAQUE) 350 MG/ML injection 100 mL IOHEXOL 350 MG/ML SOLN). Multiplanar reformatted images are provided for review. MIP images are provided for review. Automated exposure control, iterative reconstruction, and/or weight based adjustment of the mA/kV was utilized to reduce the radiation dose to as low as reasonably achievable. COMPARISON: None available. CLINICAL HISTORY: Acute aortic syndrome (AAS) suspected. Pt complains of tearing chest pain that radiates to the back. No infectious symptoms. FINDINGS: VASCULATURE: AORTA: Noncontrast CT imaging demonstrates no intramural hematoma within the thoracic aorta. Contrast enhanced series demonstrates no aortic dissection or aneurysm. No abdominal aortic aneurysm. PULMONARY ARTERIES: No pulmonary embolism within the limits of this exam. GREAT VESSELS OF AORTIC ARCH: Great vessels are normal. No dissection. No arterial occlusion or significant  stenosis. CELIAC TRUNK: No acute finding. No occlusion or significant stenosis. SUPERIOR MESENTERIC ARTERY: No acute finding. No occlusion or significant stenosis. INFERIOR MESENTERIC ARTERY: No acute finding. No occlusion or significant stenosis. RENAL ARTERIES: No acute finding. No occlusion or significant stenosis. ILIAC ARTERIES: No acute finding. No occlusion or significant stenosis. CHEST: MEDIASTINUM: No mediastinal lymphadenopathy. The heart and pericardium demonstrate no acute abnormality. No pericardial fluid. No mediastinal hematoma. LUNGS AND PLEURA: The lungs are without acute process. No focal consolidation or pulmonary edema. No pulmonary infarction. No pneumonia. No pleural fluid. No pneumothorax. Incidental finding of azygos lobe in the right lung. THORACIC BONES AND SOFT TISSUES: No acute bone or soft tissue abnormality. ABDOMEN AND PELVIS: LIVER: The liver is unremarkable. GALLBLADDER AND BILE DUCTS: Gallbladder is unremarkable. No biliary ductal dilatation. SPLEEN: The spleen is unremarkable. PANCREAS: The pancreas is unremarkable. ADRENAL GLANDS: Bilateral adrenal glands demonstrate no acute abnormality. KIDNEYS, URETERS AND BLADDER: No stones in the kidneys or ureters. No hydronephrosis.  No perinephric or periureteral stranding. Urinary bladder is unremarkable. GI AND BOWEL: Stomach and duodenal sweep demonstrate no acute abnormality. There is no bowel obstruction. No abnormal bowel wall thickening or distension. REPRODUCTIVE: Within the pelvis there is a rounded 5.3 cm mass superior to the fundus of the uterus. Findings most consistent with exophytic leiomyoma. PERITONEUM AND RETROPERITONEUM: No ascites or free air. LYMPH NODES: No lymphadenopathy. ABDOMINAL BONES AND SOFT TISSUES: Posterior lumbar fusion and posterior lumbar decompression. No acute soft tissue abnormality. IMPRESSION: 1. No evidence of acute aortic syndrome, including dissection or intramural hematoma. 2. 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 3. Posterior lumbar fusion without complication. Electronically signed by: Norleen Boxer MD 11/26/2023 10:10 AM EDT RP Workstation: [id number]   DG Chest 2 View Result Date: 11/26/2023 EXAM: 2 VIEW(S) XRAY OF THE CHEST 11/26/2023 05:02:01 AM COMPARISON: None available. CLINICAL HISTORY: CP. 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 now CP midsternum with pressure that radiates to the back. FINDINGS: LUNGS AND PLEURA: No focal pulmonary opacity. No pulmonary edema. No pleural effusion. No pneumothorax. HEART AND MEDIASTINUM: No acute abnormality of the cardiac and mediastinal silhouettes. BONES AND SOFT TISSUES: No acute osseous abnormality. IMPRESSION: 1. No acute cardiopulmonary process identified. Electronically signed by: Waddell Calk MD 11/26/2023 05:24 AM EDT RP Workstation: [id number]     Procedures   Medications Ordered in the ED - No data to display                                  Medical Decision Making Risk OTC drugs. Prescription drug management. Decision regarding hospitalization.   Will place on cardiac monitor.  No reason to repeat  labs.  Do need to repeat troponin.  This will be a third 1 for today.  Troponin is 9.  The other ones were 8 and 7.  No reason to do chest x-ray again or CT scan.  The third troponin being 9 is reassuring.  Will see how she is on cardiac monitor will run it by cardiology.  Patient's heart cath is scheduled for 29 October.  Cardiology is spoken with patient.  I also spoke with them.  They were reassured with the 3 troponins being normal they do want a delta troponin this evening.  Try her with some nitroglycerin that does not work then maybe try an opioid.  I will text her back they are considering maybe bringing her in early for cardiac cath particular because of the findings on the CT coronary, which makes perfect sense.  Patient's delta  troponin was 30.  That is significant.  Will recontact cardiology.  Radiology recommending hospitalist admission.  Radiology was thinking of starting her on heparin  I will give her aspirin will hold on the heparin .  Final diagnoses:  Precordial pain    ED Discharge Orders     None          Geraldene Hamilton, MD 11/26/23 1800    Geraldene Hamilton, MD 11/26/23 1801    Geraldene Hamilton, MD 11/26/23 JEROLYN    Geraldene Hamilton, MD 11/26/23 2010    Geraldene Hamilton, MD 11/26/23 2019

## 2023-11-26 NOTE — ED Provider Notes (Signed)
 [place] EMERGENCY DEPARTMENT AT [facility] Provider Note   CSN: [id number] Arrival date & time: 11/26/23  0424     Patient presents with: Chest Pain   Jasmin Gordon is a 53 y.o. female history of diabetes, hypertension, obesity, hyperlipidemia, OSA presents with complaints of chest pain.  Pain has been ongoing since September.  Pain is constant worse with certain movements.  Was evaluated by cardiology who had a CT coronary which demonstrated proximal LAD lesion that is hemodynamically significant, echo demonstrated LVEF of 60 to 65%.  No cough or URI symptoms.    Chest Pain  Past Medical History:  Diagnosis Date   Acute cystitis 08/11/2017   Anxiety and depression 08/11/2017   Atypical chest pain    BMI 40.0-44.9, adult (HCC)    Chest pain 08/11/2017   Constipation 08/14/2011   Cough 08/11/2017   Diabetes mellitus (HCC)    Diabetic gastroparesis associated with type 2 diabetes mellitus (HCC)    Dyspnea on exertion 08/11/2017   GERD (gastroesophageal reflux disease) 08/11/2017   Hoarseness 08/11/2017   HTN (hypertension) 08/09/2011   Intractable low back pain 08/07/2011   Left flank pain 08/11/2017   Lower urinary tract infectious disease 08/08/2011   IMO SNOMED Dx Update Oct 2024     Malaise and fatigue 08/11/2017   Morbid obesity (HCC)    Numbness of foot 08/11/2017   Obese    Oliguria 08/11/2017   Paresthesia 08/11/2017   Radiculopathy, lumbosacral region 08/08/2011   Sciatica 08/11/2017   Sinus tachycardia by electrocardiogram    Sleep apnea    Type 2 diabetes mellitus with hyperlipidemia [facility])    Past Surgical History:  Procedure Laterality Date   CESAREAN SECTION     x 4   hysterectomy surgery     lower back surgery  2013,2007   disc        Prior to Admission medications   Medication Sig Start Date End Date Taking? Authorizing Provider  carvedilol (COREG) 12.5 MG tablet Take 1 tablet (12.5 mg total) by mouth 2 (two) times daily.  11/23/23   Madireddy, Alean SAUNDERS, MD  insulin glargine (LANTUS) 100 UNIT/ML injection Inject 15 Units into the skin daily.    [provider]  losartan (COZAAR) 25 MG tablet Take 1 tablet (25 mg total) by mouth daily. 11/13/23   Madireddy, Sreedhar R, MD  OZEMPIC, 1 MG/DOSE, 4 MG/3ML SOPN Inject 1 mg into the skin once a week. 07/02/23   [provider]  rosuvastatin (CRESTOR) 10 MG tablet Take 1 tablet (10 mg total) by mouth daily. 11/23/23   Madireddy, Alean SAUNDERS, MD    Allergies: Bee venom    Review of Systems  Cardiovascular:  Positive for chest pain.    Updated Vital Signs BP 115/72 (BP Location: Right Arm)   Pulse 91   Temp 98 F (36.7 C) (Oral)   Resp 16   Ht 5' 2 (1.575 m)   Wt 109.4 kg   SpO2 98%   BMI 44.12 kg/m   Physical Exam Vitals and nursing note reviewed.  Constitutional:      General: She is not in acute distress.    Appearance: She is well-developed.  HENT:     Head: Normocephalic and atraumatic.  Eyes:     Conjunctiva/sclera: Conjunctivae normal.  Cardiovascular:     Rate and Rhythm: Normal rate and regular rhythm.     Heart sounds: No murmur heard. Pulmonary:     Effort: Pulmonary effort is  normal. No respiratory distress.     Breath sounds: Normal breath sounds.  Chest:     Chest wall: Tenderness present.  Abdominal:     Palpations: Abdomen is soft.     Tenderness: There is no abdominal tenderness.  Musculoskeletal:        General: No swelling.     Cervical back: Neck supple.  Skin:    General: Skin is warm and dry.     Capillary Refill: Capillary refill takes less than 2 seconds.  Neurological:     Mental Status: She is alert.  Psychiatric:        Mood and Affect: Mood normal.     (all labs ordered are listed, but only abnormal results are displayed) Labs Reviewed  BASIC METABOLIC PANEL WITH GFR - Abnormal; Notable for the following components:      Result Value   Sodium 134 (*)    CO2 20 (*)    Glucose, Bld 231  (*)    Calcium 8.8 (*)    All other components within normal limits  CBC - Abnormal; Notable for the following components:   WBC 16.4 (*)    All other components within normal limits  TROPONIN I (HIGH SENSITIVITY)  TROPONIN I (HIGH SENSITIVITY)    EKG: None  Radiology: CT Angio Chest/Abd/Pel for Dissection W and/or Wo Contrast Result Date: 11/26/2023 EXAM: CTA CHEST, ABDOMEN AND PELVIS WITH AND WITHOUT CONTRAST 11/26/2023 09:33:54 AM TECHNIQUE: CTA of the chest was performed with and without the administration of 100 mL of intravenous contrast (iohexol (OMNIPAQUE) 350 MG/ML injection 100 mL IOHEXOL 350 MG/ML SOLN). CTA of the abdomen and pelvis was performed with and without the administration of 100 mL of intravenous contrast (iohexol (OMNIPAQUE) 350 MG/ML injection 100 mL IOHEXOL 350 MG/ML SOLN). Multiplanar reformatted images are provided for review. MIP images are provided for review. Automated exposure control, iterative reconstruction, and/or weight based adjustment of the mA/kV was utilized to reduce the radiation dose to as low as reasonably achievable. COMPARISON: None available. CLINICAL HISTORY: Acute aortic syndrome (AAS) suspected. Pt complains of tearing chest pain that radiates to the back. No infectious symptoms. FINDINGS: VASCULATURE: AORTA: Noncontrast CT imaging demonstrates no intramural hematoma within the thoracic aorta. Contrast enhanced series demonstrates no aortic dissection or aneurysm. No abdominal aortic aneurysm. PULMONARY ARTERIES: No pulmonary embolism within the limits of this exam. GREAT VESSELS OF AORTIC ARCH: Great vessels are normal. No dissection. No arterial occlusion or significant stenosis. CELIAC TRUNK: No acute finding. No occlusion or significant stenosis. SUPERIOR MESENTERIC ARTERY: No acute finding. No occlusion or significant stenosis. INFERIOR MESENTERIC ARTERY: No acute finding. No occlusion or significant stenosis. RENAL ARTERIES: No acute finding. No  occlusion or significant stenosis. ILIAC ARTERIES: No acute finding. No occlusion or significant stenosis. CHEST: MEDIASTINUM: No mediastinal lymphadenopathy. The heart and pericardium demonstrate no acute abnormality. No pericardial fluid. No mediastinal hematoma. LUNGS AND PLEURA: The lungs are without acute process. No focal consolidation or pulmonary edema. No pulmonary infarction. No pneumonia. No pleural fluid. No pneumothorax. Incidental finding of azygos lobe in the right lung. THORACIC BONES AND SOFT TISSUES: No acute bone or soft tissue abnormality. ABDOMEN AND PELVIS: LIVER: The liver is unremarkable. GALLBLADDER AND BILE DUCTS: Gallbladder is unremarkable. No biliary ductal dilatation. SPLEEN: The spleen is unremarkable. PANCREAS: The pancreas is unremarkable. ADRENAL GLANDS: Bilateral adrenal glands demonstrate no acute abnormality. KIDNEYS, URETERS AND BLADDER: No stones in the kidneys or ureters. No hydronephrosis. No perinephric or periureteral stranding. Urinary bladder  is unremarkable. GI AND BOWEL: Stomach and duodenal sweep demonstrate no acute abnormality. There is no bowel obstruction. No abnormal bowel wall thickening or distension. REPRODUCTIVE: Within the pelvis there is a rounded 5.3 cm mass superior to the fundus of the uterus. Findings most consistent with exophytic leiomyoma. PERITONEUM AND RETROPERITONEUM: No ascites or free air. LYMPH NODES: No lymphadenopathy. ABDOMINAL BONES AND SOFT TISSUES: Posterior lumbar fusion and posterior lumbar decompression. No acute soft tissue abnormality. IMPRESSION: 1. No evidence of acute aortic syndrome, including dissection or intramural hematoma. 2. 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 3. Posterior lumbar fusion without complication. Electronically signed by: Norleen Boxer MD 11/26/2023 10:10 AM EDT RP Workstation: [id number]   DG Chest 2 View Result Date: 11/26/2023 EXAM: 2 VIEW(S) XRAY OF THE CHEST  11/26/2023 05:02:01 AM COMPARISON: None available. CLINICAL HISTORY: CP. 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 now CP midsternum with pressure that radiates to the back. FINDINGS: LUNGS AND PLEURA: No focal pulmonary opacity. No pulmonary edema. No pleural effusion. No pneumothorax. HEART AND MEDIASTINUM: No acute abnormality of the cardiac and mediastinal silhouettes. BONES AND SOFT TISSUES: No acute osseous abnormality. IMPRESSION: 1. No acute cardiopulmonary process identified. Electronically signed by: Waddell Calk MD 11/26/2023 05:24 AM EDT RP Workstation: [id number]     Procedures   Medications Ordered in the ED  iohexol (OMNIPAQUE) 350 MG/ML injection 100 mL (100 mLs Intravenous Contrast Given 11/26/23 0933)    Clinical Course as of 11/26/23 1150  Sun Nov 26, 2023  1147 Patient with multiple cardiovascular risk factors evaluated for constant chest pain since September.  She was hypertensive upon arrival with systolics near 190.  Otherwise hemodynamically stable.  Her lung sounds are clear, she does have reproducible chest wall tenderness.  Her lab work is reassuring at this point.  With about EKG changes and delta troponin within normal limits.  She was describing searing chest pain that was radiating to her back and in the setting of hypertension a dissection study was performed which did not show any evidence of dissection.  She does have a leukocytosis, however without any infectious symptoms or any consolidation on CT imaging.  Workup is overall reassuring and she will be discharged home with cardiology follow-up.  Patient is understanding and agreement with plan.  Strict return precautions provided. [JT]    Clinical Course User Index [JT] Donnajean Lynwood DEL, PA-C                                 Medical Decision Making Amount and/or Complexity of Data Reviewed Labs: ordered. Radiology: ordered.  Risk Prescription drug management.   This  patient presents to the ED with chief complaint(s) of chest pain.  The complaint involves an extensive differential diagnosis and also carries with it a high risk of complications and morbidity.   Pertinent past medical history as listed in HPI  The differential diagnosis includes  Do not suspect ACS as patient is without any EKG changes and her delta troponins without elevation.  No evidence of dissection, PE or pneumonia on CT. Additional history obtained: Records reviewed Care Everywhere/External Records  Disposition:   Patient will be discharged home. The patient has been appropriately medically screened and/or stabilized in the ED. I have low suspicion for any other emergent medical condition which would require further screening, evaluation or treatment in the ED or require  inpatient management. At time of discharge the patient is hemodynamically stable and in no acute distress. I have discussed work-up results and diagnosis with patient and answered all questions. Patient is agreeable with discharge plan. We discussed strict return precautions for returning to the emergency department and they verbalized understanding.     Social Determinants of Health:   none  This note was dictated with voice recognition software.  Despite best efforts at proofreading, errors may have occurred which can change the documentation meaning.       Final diagnoses:  Atypical chest pain    ED Discharge Orders     None          Donnajean Lynwood VEAR DEVONNA 11/26/23 1150    Ula Prentice SAUNDERS, MD 11/26/23 1416

## 2023-11-27 ENCOUNTER — Encounter (HOSPITAL_COMMUNITY)
Admission: EM | Disposition: A | Discharge status: Home / Self Care | Payer: Self-pay | Source: Home / Self Care | Attending: Thoracic Surgery (Cardiothoracic Vascular Surgery)

## 2023-11-27 DIAGNOSIS — R7989 Other specified abnormal findings of blood chemistry: Secondary | ICD-10-CM

## 2023-11-27 DIAGNOSIS — I25110 Atherosclerotic heart disease of native coronary artery with unstable angina pectoris: Secondary | ICD-10-CM

## 2023-11-27 DIAGNOSIS — R072 Precordial pain: Secondary | ICD-10-CM

## 2023-11-27 DIAGNOSIS — G4730 Sleep apnea, unspecified: Secondary | ICD-10-CM

## 2023-11-27 DIAGNOSIS — K219 Gastro-esophageal reflux disease without esophagitis: Secondary | ICD-10-CM

## 2023-11-27 DIAGNOSIS — I2510 Atherosclerotic heart disease of native coronary artery without angina pectoris: Secondary | ICD-10-CM

## 2023-11-27 DIAGNOSIS — I214 Non-ST elevation (NSTEMI) myocardial infarction: Secondary | ICD-10-CM | POA: Diagnosis not present

## 2023-11-27 DIAGNOSIS — E669 Obesity, unspecified: Secondary | ICD-10-CM

## 2023-11-27 DIAGNOSIS — E1159 Type 2 diabetes mellitus with other circulatory complications: Secondary | ICD-10-CM

## 2023-11-27 DIAGNOSIS — Z981 Arthrodesis status: Secondary | ICD-10-CM

## 2023-11-27 DIAGNOSIS — I10 Essential (primary) hypertension: Secondary | ICD-10-CM

## 2023-11-27 DIAGNOSIS — E785 Hyperlipidemia, unspecified: Secondary | ICD-10-CM

## 2023-11-27 DIAGNOSIS — I200 Unstable angina: Secondary | ICD-10-CM | POA: Diagnosis not present

## 2023-11-27 HISTORY — PX: LEFT HEART CATH AND CORONARY ANGIOGRAPHY: CATH118249

## 2023-11-27 LAB — GLUCOSE, CAPILLARY
Glucose-Capillary: 100 mg/dL — ABNORMAL HIGH (ref 70–99)
Glucose-Capillary: 198 mg/dL — ABNORMAL HIGH (ref 70–99)

## 2023-11-27 LAB — CBC
HCT: 37.9 % (ref 36.0–46.0)
Hemoglobin: 12.7 g/dL (ref 12.0–15.0)
MCH: 29.4 pg (ref 26.0–34.0)
MCHC: 33.5 g/dL (ref 30.0–36.0)
MCV: 87.7 fL (ref 80.0–100.0)
Platelets: 236 K/uL (ref 150–400)
RBC: 4.32 MIL/uL (ref 3.87–5.11)
RDW: 12.7 % (ref 11.5–15.5)
WBC: 11.3 K/uL — ABNORMAL HIGH (ref 4.0–10.5)
nRBC: 0 % (ref 0.0–0.2)

## 2023-11-27 LAB — HIV ANTIBODY (ROUTINE TESTING W REFLEX): HIV Screen 4th Generation wRfx: NONREACTIVE

## 2023-11-27 LAB — HEMOGLOBIN A1C
Hgb A1c MFr Bld: 7.5 % — ABNORMAL HIGH (ref 4.8–5.6)
Mean Plasma Glucose: 168.55 mg/dL

## 2023-11-27 LAB — LIPID PANEL
Cholesterol: 153 mg/dL (ref 0–200)
HDL: 31 mg/dL — ABNORMAL LOW (ref >40)
LDL Cholesterol: 98 mg/dL (ref 0–99)
Total CHOL/HDL Ratio: 4.9 ratio
Triglycerides: 121 mg/dL (ref <150)
VLDL: 24 mg/dL (ref 0–40)

## 2023-11-27 LAB — BASIC METABOLIC PANEL WITH GFR
Anion gap: 10 (ref 5–15)
BUN: 10 mg/dL (ref 6–20)
CO2: 22 mmol/L (ref 22–32)
Calcium: 8.7 mg/dL — ABNORMAL LOW (ref 8.9–10.3)
Chloride: 105 mmol/L (ref 98–111)
Creatinine, Ser: 0.52 mg/dL (ref 0.44–1.00)
GFR, Estimated: >60 mL/min (ref >60)
Glucose, Bld: 213 mg/dL — ABNORMAL HIGH (ref 70–99)
Potassium: 3.6 mmol/L (ref 3.5–5.1)
Sodium: 137 mmol/L (ref 135–145)

## 2023-11-27 LAB — CBG MONITORING, ED
Glucose-Capillary: 190 mg/dL — ABNORMAL HIGH (ref 70–99)
Glucose-Capillary: 122 mg/dL — ABNORMAL HIGH (ref 70–99)

## 2023-11-27 LAB — TROPONIN I (HIGH SENSITIVITY): Troponin I (High Sensitivity): 11 ng/L (ref <18)

## 2023-11-27 LAB — HEPARIN LEVEL (UNFRACTIONATED)
Heparin Unfractionated: <0.1 [IU]/mL — ABNORMAL LOW (ref 0.30–0.70)
Heparin Unfractionated: <0.1 [IU]/mL — ABNORMAL LOW (ref 0.30–0.70)

## 2023-11-27 LAB — MAGNESIUM: Magnesium: 1.8 mg/dL (ref 1.7–2.4)

## 2023-11-27 LAB — PHOSPHORUS: Phosphorus: 3.7 mg/dL (ref 2.5–4.6)

## 2023-11-27 SURGERY — LEFT HEART CATH AND CORONARY ANGIOGRAPHY
Anesthesia: LOCAL

## 2023-11-27 MED ORDER — SODIUM CHLORIDE 0.9 % IV SOLN: 250.0000 mL | INTRAVENOUS | Status: AC | PRN

## 2023-11-27 MED ORDER — FREE WATER: 500.0000 mL | Freq: Once | Status: DC

## 2023-11-27 MED ORDER — FENTANYL CITRATE (PF) 100 MCG/2ML IJ SOLN
INTRAMUSCULAR | Status: DC | PRN
  Administered 2023-11-27: 25 ug via INTRAVENOUS

## 2023-11-27 MED ORDER — LABETALOL HCL 5 MG/ML IV SOLN: 10.0000 mg | INTRAVENOUS | Status: AC | PRN

## 2023-11-27 MED ORDER — POTASSIUM CHLORIDE CRYS ER 20 MEQ PO TBCR
20.0000 meq | EXTENDED_RELEASE_TABLET | Freq: Once | ORAL | Status: AC
  Administered 2023-11-27: 20 meq via ORAL
  Filled 2023-11-27: qty 1

## 2023-11-27 MED ORDER — SODIUM CHLORIDE 0.9% FLUSH
3.0000 mL | INTRAVENOUS | Status: DC | PRN
Start: 2023-11-27 — End: 2023-11-29

## 2023-11-27 MED ORDER — HEPARIN BOLUS VIA INFUSION
2000.0000 [IU] | Freq: Once | INTRAVENOUS | Status: AC
  Administered 2023-11-27: 2000 [IU] via INTRAVENOUS
  Filled 2023-11-27: qty 2000

## 2023-11-27 MED ORDER — FENTANYL CITRATE (PF) 100 MCG/2ML IJ SOLN
INTRAMUSCULAR | Status: AC
  Filled 2023-11-27: qty 2

## 2023-11-27 MED ORDER — SODIUM CHLORIDE 0.9% FLUSH: 10.0000 mL | INTRAVENOUS | Status: DC | PRN

## 2023-11-27 MED ORDER — HEPARIN (PORCINE) 25000 UT/250ML-% IV SOLN
1800.0000 [IU]/h | INTRAVENOUS | Status: DC
  Administered 2023-11-27: 1400 [IU]/h via INTRAVENOUS
  Administered 2023-11-28: 1600 [IU]/h via INTRAVENOUS
  Administered 2023-11-29: 1800 [IU]/h via INTRAVENOUS
  Filled 2023-11-27 (×3): qty 250

## 2023-11-27 MED ORDER — MAGNESIUM SULFATE 2 GM/50ML IV SOLN
2.0000 g | Freq: Once | INTRAVENOUS | Status: AC
  Administered 2023-11-27: 2 g via INTRAVENOUS
  Filled 2023-11-27: qty 50

## 2023-11-27 MED ORDER — MUPIROCIN 2 % EX OINT
1.0000 | TOPICAL_OINTMENT | Freq: Two times a day (BID) | CUTANEOUS | Status: AC
  Administered 2023-11-27 – 2023-12-02 (×9): 1 via NASAL
  Filled 2023-11-27 (×3): qty 22

## 2023-11-27 MED ORDER — MIDAZOLAM HCL (PF) 2 MG/2ML IJ SOLN
INTRAMUSCULAR | Status: DC | PRN
  Administered 2023-11-27: 1 mg via INTRAVENOUS

## 2023-11-27 MED ORDER — MIDAZOLAM HCL 2 MG/2ML IJ SOLN
INTRAMUSCULAR | Status: AC
Start: 2023-11-27 — End: 2023-11-27
  Filled 2023-11-27: qty 2

## 2023-11-27 MED ORDER — SODIUM CHLORIDE 0.9% FLUSH
10.0000 mL | Freq: Two times a day (BID) | INTRAVENOUS | Status: DC
  Administered 2023-11-28 (×3): 10 mL

## 2023-11-27 MED ORDER — HYDRALAZINE HCL 20 MG/ML IJ SOLN
INTRAMUSCULAR | Status: AC
  Filled 2023-11-27: qty 1

## 2023-11-27 MED ORDER — AMLODIPINE BESYLATE 5 MG PO TABS
5.0000 mg | ORAL_TABLET | Freq: Every day | ORAL | Status: DC
Start: 2023-11-27 — End: 2023-11-28
  Administered 2023-11-27: 5 mg via ORAL
  Filled 2023-11-27: qty 1

## 2023-11-27 MED ORDER — HYDRALAZINE HCL 20 MG/ML IJ SOLN
10.0000 mg | INTRAMUSCULAR | Status: AC | PRN
  Administered 2023-11-27 (×2): 10 mg via INTRAVENOUS

## 2023-11-27 MED ORDER — HEPARIN (PORCINE) IN NACL 1000-0.9 UT/500ML-% IV SOLN
INTRAVENOUS | Status: DC | PRN
  Administered 2023-11-27 (×2): 500 mL

## 2023-11-27 MED ORDER — VERAPAMIL HCL 2.5 MG/ML IV SOLN
INTRAVENOUS | Status: AC
  Filled 2023-11-27: qty 2

## 2023-11-27 MED ORDER — LIDOCAINE HCL (PF) 1 % IJ SOLN
INTRAMUSCULAR | Status: DC | PRN
  Administered 2023-11-27: 2 mL

## 2023-11-27 MED ORDER — HEPARIN SODIUM (PORCINE) 1000 UNIT/ML IJ SOLN
INTRAMUSCULAR | Status: AC
Start: 2023-11-27 — End: 2023-11-27
  Filled 2023-11-27: qty 10

## 2023-11-27 MED ORDER — FREE WATER
500.0000 mL | Freq: Once | Status: AC
  Administered 2023-11-27: 500 mL via ORAL

## 2023-11-27 MED ORDER — NITROGLYCERIN IN D5W 200-5 MCG/ML-% IV SOLN
INTRAVENOUS | Status: AC
  Filled 2023-11-27: qty 250

## 2023-11-27 MED ORDER — IOHEXOL 350 MG/ML SOLN
INTRAVENOUS | Status: DC | PRN
  Administered 2023-11-27: 55 mL

## 2023-11-27 MED ORDER — HEPARIN SODIUM (PORCINE) 1000 UNIT/ML IJ SOLN
INTRAMUSCULAR | Status: DC | PRN
  Administered 2023-11-27: 6000 [IU] via INTRAVENOUS

## 2023-11-27 MED ORDER — SODIUM CHLORIDE 0.9% FLUSH
3.0000 mL | Freq: Two times a day (BID) | INTRAVENOUS | Status: DC
Start: 2023-11-27 — End: 2023-11-29
  Administered 2023-11-27: 3 mL via INTRAVENOUS

## 2023-11-27 MED ORDER — NITROGLYCERIN IN D5W 200-5 MCG/ML-% IV SOLN
0.0000 ug/min | INTRAVENOUS | Status: DC
  Administered 2023-11-27: 5 ug/min via INTRAVENOUS

## 2023-11-27 MED ORDER — VERAPAMIL HCL 2.5 MG/ML IV SOLN
INTRAVENOUS | Status: DC | PRN
  Administered 2023-11-27: 10 mL via INTRA_ARTERIAL

## 2023-11-27 SURGICAL SUPPLY — 7 items
CATH INFINITI 5 FR JL3.5 (CATHETERS) IMPLANT
CATH INFINITI JR4 5F (CATHETERS) IMPLANT
DEVICE RAD COMP TR BAND LRG (VASCULAR PRODUCTS) IMPLANT
GLIDESHEATH SLEND SS 6F .021 (SHEATH) IMPLANT
GUIDEWIRE INQWIRE 1.5J.035X260 (WIRE) IMPLANT
PACK CARDIAC CATHETERIZATION (CUSTOM PROCEDURE TRAY) ×1 IMPLANT
SET ATX-X65L (MISCELLANEOUS) IMPLANT

## 2023-11-27 NOTE — Progress Notes (Signed)
 ANTICOAGULATION CONSULT NOTE  Pharmacy Consult for Heparin  Indication: chest pain/ACS  Allergies  Allergen Reactions   Bee Venom Anaphylaxis    Patient Measurements: Height: 5' 2 (157.5 cm) Weight: 109.4 kg (241 lb 2.9 oz) IBW/kg (Calculated) : 50.1 Heparin  Dosing Weight: 76.7 kg  Vital Signs: Temp: 97.8 F (36.6 C) (10/26 2203) Temp Source: Oral (10/26 2203) BP: 146/88 (10/27 0415) Pulse Rate: 88 (10/27 0415)  Labs: Recent Labs    11/26/23 0452 11/26/23 0752 11/26/23 1650 11/26/23 1850 11/27/23 0026 11/27/23 0356  HGB 13.6  --   --   --   --  12.7  HCT 39.7  --   --   --   --  37.9  PLT 349  --   --   --   --  236  HEPARINUNFRC  --   --   --   --   --  <0.10*  CREATININE 0.65  --   --   --   --   --   TROPONINIHS 8   < > 9 30* 11  --    < > = values in this interval not displayed.    Estimated Creatinine Clearance: 95.8 mL/min (by C-G formula based on SCr of 0.65 mg/dL).  Assessment: 76 yof with a history of CAD. Patient is presenting with chest pain. Heparin  per pharmacy consult placed for chest pain/ACS. Patient is not on anticoagulation prior to arrival.  AM: heparin  level undetectable on 900 units/hr. Per RN, no issues with heparin  running continuously. CBC shows Hgb 13.6 > 12.7, plts 349 > 236.  Goal of Therapy:  Heparin  level 0.3-0.7 units/ml Monitor platelets by anticoagulation protocol: Yes   Plan:  IV heparin  2000 unit bolus  Start heparin  infusion at 1200 units/hr Check anti-Xa level in 6 hours and daily while on heparin  Continue to monitor H&H and platelets Plans for LHC @1300   Lynwood Poplar, PharmD, BCPS Clinical Pharmacist 11/27/2023 4:40 AM

## 2023-11-27 NOTE — Progress Notes (Signed)

## 2023-11-27 NOTE — Progress Notes (Addendum)
 ANTICOAGULATION CONSULT NOTE  Pharmacy Consult for Heparin  Indication: chest pain/ACS  Allergies  Allergen Reactions   Bee Venom Anaphylaxis    Patient Measurements: Height: 5' 2 (157.5 cm) Weight: 109.4 kg (241 lb 2.9 oz) IBW/kg (Calculated) : 50.1 Heparin  Dosing Weight: 76.7 kg  Vital Signs: Temp: 99 F (37.2 C) (10/27 1235) Temp Source: Oral (10/27 1235) BP: 174/104 (10/27 1235) Pulse Rate: 92 (10/27 1235)  Labs: Recent Labs    11/26/23 0452 11/26/23 0752 11/26/23 1650 11/26/23 1850 11/27/23 0026 11/27/23 0356 11/27/23 1103  HGB 13.6  --   --   --   --  12.7  --   HCT 39.7  --   --   --   --  37.9  --   PLT 349  --   --   --   --  236  --   HEPARINUNFRC  --   --   --   --   --  <0.10* <0.10*  CREATININE 0.65  --   --   --   --  0.52  --   TROPONINIHS 8   < > 9 30* 11  --   --    < > = values in this interval not displayed.    Estimated Creatinine Clearance: 95.8 mL/min (by C-G formula based on SCr of 0.52 mg/dL).  Assessment: 11 yof with a history of CAD. Patient is presenting with chest pain. Heparin  per pharmacy consult placed for chest pain/ACS. Patient is not on anticoagulation prior to arrival.  Heparin  level this morning came back undetectable (<0.1), on heparin  infusion at 1200 units/hr. CBC stable. No s/sx of bleeding or infusion issues.   Goal of Therapy:  Heparin  level 0.3-0.7 units/ml Monitor platelets by anticoagulation protocol: Yes   Plan:  Order IV heparin  2000 unit bolus  Increase heparin  infusion to 1400 units/hr Check anti-Xa level in 6 hours and daily while on heparin  Continue to monitor H&H and platelets  ADDENDUM Plan to get patient now for cardiac cath - will hold on adjustments and follow up after procedure.  Thank you for allowing pharmacy to participate in this patient's care,  Suzen Sour, PharmD, BCCCP Clinical Pharmacist  Phone: [phone] 11/27/2023 1:15 PM  Please check AMION for all [facility] phone  numbers After 10:00 PM, call Main Pharmacy [phone]  ADDENDUM Underwent cardiac cath finding severe stenosis proximal LAD involving large caliber diagonal branch > plan to ask CTVS to evaluate for bypass. Okay to restart heparin  2 hours after TR band removal - will order heparin  infusion at 1400 units/hr >> 6 hr heparin  level after restart.  Thank you for allowing pharmacy to participate in this patient's care,  Suzen Sour, PharmD, BCCCP Clinical Pharmacist

## 2023-11-27 NOTE — Progress Notes (Signed)
 [facility]  PROGRESS NOTE  [name] FMW:[id number] DOB: [birth year] DOA: 11/26/2023 PCP: [name], MD   Brief HPI:    53 y.o. female with hx of CAD, recent onset of anginal symptoms ~ 1 month ago, with recent coronary CT demonstrating a hemodynamically significant proximal LAD lesion plan for upcoming cardiac catheterization outpatient, additional history including morbid obesity, diabetes type 2, hypertension, hyperlipidemia, OSA, GERD, who presented with worsening chest pain.  Reports that she is typically had exertional or nighttime symptoms.  However over the past day symptoms have become significantly worse.  Had severe episodes of chest pressure radiating to her left back left arm and jaw since yesterday evening around 11 PM.  She had 7 discrete severe episodes for which she sought ED care.  Upon returning home she had another severe episode around 3:45 PM which was 20 out of 10 in severity per her report.  Symptoms lasted for about 15 minutes, the severe symptoms had resolved prior to receiving nitroglycerin.  While ambulating around the ER she had a flare of her chest pain.  No associated shortness of breath, diaphoresis.  Does have some associated nausea.  Is taking aspirin daily prior to admission.     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 -Follow-up OB/GYN as  outpatient  Morbid obesity:  -On GLP1 outpatient  Diabetes type 2:  -OP regimen lantus 15 units daily (although not taken this week), Ozempic; inpatient reduce basal to 8 units daily while NPO, adjust as needed. Add SSI for very sensitive.   Hypertension:  -Continue BBlocker per above   Hyperlipidemia:  -Statin per above   OSA:  -CPAP   GERD:  -Famotidine prn       DVT prophylaxis: Heparin    Medications     aspirin  81 mg Oral Daily   carvedilol  12.5 mg Oral BID   insulin aspart  0-6 Units Subcutaneous TID WC   insulin glargine-yfgn  8 Units Subcutaneous Daily   nitroGLYCERIN  0.5 inch Topical Q6H   rosuvastatin  20 mg Oral Daily   sodium chloride  flush  3 mL Intravenous Q12H     Data Reviewed:   CBG:  No results for input(s): GLUCAP in the last 168 hours.  SpO2: 100 %    Vitals:   11/27/23 0415 11/27/23 0503 11/27/23 0530 11/27/23 0645  BP: (!) 146/88 (!) 146/88 122/84 123/74  Pulse: 88 88 93 89  Resp: (!) 21 (!) 21 18 13   Temp:  97.8 F (36.6 C)    TempSrc:  Oral    SpO2: 97% 97% 99% 100%  Weight:      Height:          Data Reviewed:  Basic Metabolic Panel: Recent Labs  Lab 11/21/23 1004 11/23/23 1202 11/26/23 0452 11/27/23 0356  NA  --  137 134* 137  K  --  4.1 4.0 3.6  CL  --  101 102 105  CO2  --  22 20* 22  GLUCOSE  --  126* 231* 213*  BUN  --  14 14 10   CREATININE 0.60 0.56* 0.65 0.52  CALCIUM  --  9.1 8.8* 8.7*  MG  --   --   --  1.8  PHOS  --   --   --  3.7    CBC: Recent Labs  Lab 11/23/23 1202 11/26/23 0452 11/27/23 0356  WBC 12.0* 16.4* 11.3*  HGB 14.1 13.6 12.7  HCT 43.7 39.7 37.9  MCV 90 86.3 87.7  PLT 374 349 236    LFT No results for input(s): AST, ALT, ALKPHOS, BILITOT, PROT, ALBUMIN in the last 168 hours.   Antibiotics: Anti-infectives (From admission, onward)    None        CONSULTS cardiology  Code Status: Full code  Family Communication: Discussed with patient's daughter  at bedside     Subjective   Still complains of chest pressure   Objective    Physical Examination:  General-appears in no acute distress Heart-S1-S2, regular, no murmur auscultated Lungs-clear to auscultation bilaterally, no wheezing or crackles auscultated Abdomen-soft, nontender, no organomegaly Extremities-no edema in the lower extremities Neuro-alert, oriented x3, no focal deficit noted   Status is: Inpatient:             [name]   [facility] If 7PM-7AM, please contact night-coverage at [email or web address], Office  [phone]   11/27/2023, 8:06 AM  LOS: 1 day

## 2023-11-27 NOTE — Interval H&P Note (Signed)
 History and Physical Interval Note:  11/27/2023 1:26 PM  AHONESTY WOODFIN  has presented today for surgery, with the diagnosis of nstemi.  The various methods of treatment have been discussed with the patient and family. After consideration of risks, benefits and other options for treatment, the patient has consented to  Procedure(s): LEFT HEART CATH AND CORONARY ANGIOGRAPHY (N/A) as a surgical intervention.  The patient's history has been reviewed, patient examined, no change in status, stable for surgery.  I have reviewed the patient's chart and labs.  Questions were answered to the patient's satisfaction.    Cath Lab Visit (complete for each Cath Lab visit)  Clinical Evaluation Leading to the Procedure:   ACS: Yes.    Non-ACS:    Anginal Classification: CCS III  Anti-ischemic medical therapy: Minimal Therapy (1 class of medications)  Non-Invasive Test Results: High-risk stress test findings: cardiac mortality >3%/year (outpatient coronary CTA with severe LAD stensosis. Pt admitted with chest pain before outpatient cath could be arranged)  Prior CABG: No previous CABG        Lonni Cash

## 2023-11-27 NOTE — Progress Notes (Addendum)
 Rounding Note   Patient Name: Jasmin Gordon Date of Encounter: 11/27/2023  [facility] Cardiologist: Alean SAUNDERS Madireddy, MD   Subjective Patient reports constant chest pain that has been waxing and waning since the evening of 10/25. She is currently have 4/10 chest pressure but states this is improved from before and looks comfortable. She also reports exertional shortness of breath for the last month.   Scheduled Meds:  amLODipine   5 mg Oral Daily   aspirin  81 mg Oral Daily   carvedilol  12.5 mg Oral BID   insulin aspart  0-6 Units Subcutaneous TID WC   insulin glargine-yfgn  8 Units Subcutaneous Daily   nitroGLYCERIN  0.5 inch Topical Q6H   rosuvastatin  20 mg Oral Daily   sodium chloride  flush  3 mL Intravenous Q12H   Continuous Infusions:  heparin  1,200 Units/hr (11/27/23 0458)   PRN Meds: acetaminophen , albuterol, famotidine, melatonin, ondansetron  (ZOFRAN ) IV, polyethylene glycol   Vital Signs  Vitals:   11/27/23 0415 11/27/23 0503 11/27/23 0530 11/27/23 0645  BP: (!) 146/88 (!) 146/88 122/84 123/74  Pulse: 88 88 93 89  Resp: (!) 21 (!) 21 18 13   Temp:  97.8 F (36.6 C)    TempSrc:  Oral    SpO2: 97% 97% 99% 100%  Weight:      Height:        Intake/Output Summary (Last 24 hours) at 11/27/2023 0856 Last data filed at 11/26/2023 2049 Gross per 24 hour  Intake 30 ml  Output --  Net 30 ml      11/27/2023   12:05 AM 11/26/2023    4:39 AM 11/23/2023   10:50 AM  Last 3 Weights  Weight (lbs) 241 lb 2.9 oz 241 lb 3.2 oz 241 lb 3.2 oz  Weight (kg) 109.4 kg 109.408 kg 109.408 kg      Telemetry Normal sinus rhythm with rates in the 80s to 90s. - Personally Reviewed  ECG  No new ECG tracing today. - Personally Reviewed  Physical Exam  GEN: Obese female resting comfortably in no acute distress.   Neck: JVD difficult to assess due to body habitus. Cardiac: Mildly bradycardic with normal rate. No murmurs, rubs, or gallops. Radial pulses and  distal pedal pulses 2+ and equal bilaterally.  Respiratory: Clear to auscultation bilaterally. No wheezes, rhonchi, or rales. MS: No lower extremity edema. No deformity.  Neuro:  No focal deficits.  Psych: Normal affect   Labs High Sensitivity Troponin:   Recent Labs  Lab 11/26/23 0452 11/26/23 0752 11/26/23 1650 11/26/23 1850 11/27/23 0026  TROPONINIHS 8 7 9  30* 11     Chemistry Recent Labs  Lab 11/23/23 1202 11/26/23 0452 11/27/23 0356  NA 137 134* 137  K 4.1 4.0 3.6  CL 101 102 105  CO2 22 20* 22  GLUCOSE 126* 231* 213*  BUN 14 14 10   CREATININE 0.56* 0.65 0.52  CALCIUM 9.1 8.8* 8.7*  MG  --   --  1.8  GFRNONAA  --  >60 >60  ANIONGAP  --  12 10    Lipids  Recent Labs  Lab 11/27/23 0356  CHOL 153  TRIG 121  HDL 31*  LDLCALC 98  CHOLHDL 4.9    Hematology Recent Labs  Lab 11/23/23 1202 11/26/23 0452 11/27/23 0356  WBC 12.0* 16.4* 11.3*  RBC 4.85 4.60 4.32  HGB 14.1 13.6 12.7  HCT 43.7 39.7 37.9  MCV 90 86.3 87.7  MCH 29.1 29.6 29.4  MCHC 32.3  34.3 33.5  RDW 12.7 12.7 12.7  PLT 374 349 236   Thyroid No results for input(s): TSH, FREET4 in the last 168 hours.  BNPNo results for input(s): BNP, PROBNP in the last 168 hours.  DDimer No results for input(s): DDIMER in the last 168 hours.   Radiology  CT Angio Chest/Abd/Pel for Dissection W and/or Wo Contrast Result Date: 11/26/2023 EXAM: CTA CHEST, ABDOMEN AND PELVIS WITH AND WITHOUT CONTRAST 11/26/2023 09:33:54 AM TECHNIQUE: CTA of the chest was performed with and without the administration of 100 mL of intravenous contrast (iohexol (OMNIPAQUE) 350 MG/ML injection 100 mL IOHEXOL 350 MG/ML SOLN). CTA of the abdomen and pelvis was performed with and without the administration of 100 mL of intravenous contrast (iohexol (OMNIPAQUE) 350 MG/ML injection 100 mL IOHEXOL 350 MG/ML SOLN). Multiplanar reformatted images are provided for review. MIP images are provided for review. Automated exposure  control, iterative reconstruction, and/or weight based adjustment of the mA/kV was utilized to reduce the radiation dose to as low as reasonably achievable. COMPARISON: None available. CLINICAL HISTORY: Acute aortic syndrome (AAS) suspected. Pt complains of tearing chest pain that radiates to the back. No infectious symptoms. FINDINGS: VASCULATURE: AORTA: Noncontrast CT imaging demonstrates no intramural hematoma within the thoracic aorta. Contrast enhanced series demonstrates no aortic dissection or aneurysm. No abdominal aortic aneurysm. PULMONARY ARTERIES: No pulmonary embolism within the limits of this exam. GREAT VESSELS OF AORTIC ARCH: Great vessels are normal. No dissection. No arterial occlusion or significant stenosis. CELIAC TRUNK: No acute finding. No occlusion or significant stenosis. SUPERIOR MESENTERIC ARTERY: No acute finding. No occlusion or significant stenosis. INFERIOR MESENTERIC ARTERY: No acute finding. No occlusion or significant stenosis. RENAL ARTERIES: No acute finding. No occlusion or significant stenosis. ILIAC ARTERIES: No acute finding. No occlusion or significant stenosis. CHEST: MEDIASTINUM: No mediastinal lymphadenopathy. The heart and pericardium demonstrate no acute abnormality. No pericardial fluid. No mediastinal hematoma. LUNGS AND PLEURA: The lungs are without acute process. No focal consolidation or pulmonary edema. No pulmonary infarction. No pneumonia. No pleural fluid. No pneumothorax. Incidental finding of azygos lobe in the right lung. THORACIC BONES AND SOFT TISSUES: No acute bone or soft tissue abnormality. ABDOMEN AND PELVIS: LIVER: The liver is unremarkable. GALLBLADDER AND BILE DUCTS: Gallbladder is unremarkable. No biliary ductal dilatation. SPLEEN: The spleen is unremarkable. PANCREAS: The pancreas is unremarkable. ADRENAL GLANDS: Bilateral adrenal glands demonstrate no acute abnormality. KIDNEYS, URETERS AND BLADDER: No stones in the kidneys or ureters. No  hydronephrosis. No perinephric or periureteral stranding. Urinary bladder is unremarkable. GI AND BOWEL: Stomach and duodenal sweep demonstrate no acute abnormality. There is no bowel obstruction. No abnormal bowel wall thickening or distension. REPRODUCTIVE: Within the pelvis there is a rounded 5.3 cm mass superior to the fundus of the uterus. Findings most consistent with exophytic leiomyoma. PERITONEUM AND RETROPERITONEUM: No ascites or free air. LYMPH NODES: No lymphadenopathy. ABDOMINAL BONES AND SOFT TISSUES: Posterior lumbar fusion and posterior lumbar decompression. No acute soft tissue abnormality. IMPRESSION: 1. No evidence of acute aortic syndrome, including dissection or intramural hematoma. 2. 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 3. Posterior lumbar fusion without complication. Electronically signed by: Norleen Boxer MD 11/26/2023 10:10 AM EDT RP Workstation: [id number]   DG Chest 2 View Result Date: 11/26/2023 EXAM: 2 VIEW(S) XRAY OF THE CHEST 11/26/2023 05:02:01 AM COMPARISON: None available. CLINICAL HISTORY: CP. 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 now CP midsternum with pressure that radiates  to the back. FINDINGS: LUNGS AND PLEURA: No focal pulmonary opacity. No pulmonary edema. No pleural effusion. No pneumothorax. HEART AND MEDIASTINUM: No acute abnormality of the cardiac and mediastinal silhouettes. BONES AND SOFT TISSUES: No acute osseous abnormality. IMPRESSION: 1. No acute cardiopulmonary process identified. Electronically signed by: Waddell Calk MD 11/26/2023 05:24 AM EDT RP Workstation: [id number]    Cardiac Studies  Echocardiogram 11/15/2023: Impressions:  1. Left ventricular ejection fraction, by estimation, is 60 to 65%. The  left ventricle has normal function. The left ventricle has no regional  wall motion abnormalities. There is mild left ventricular hypertrophy.  Left ventricular  diastolic parameters  are consistent with Grade I diastolic dysfunction (impaired relaxation).  The average left ventricular global longitudinal strain is 18.5 %. The  global longitudinal strain is normal.   2. Right ventricular systolic function is normal. The right ventricular  size is normal. There is normal pulmonary artery systolic pressure.   3. The mitral valve is normal in structure. No evidence of mitral valve  regurgitation. No evidence of mitral stenosis.   4. The aortic valve is normal in structure. Aortic valve regurgitation is  not visualized. No aortic stenosis is present.   5. The inferior vena cava is normal in size with greater than 50%  respiratory variability, suggesting right atrial pressure of 3 mmHg.  _______________  Coronary CTA 11/21/2023: Summary: 1. Coronary calcium score of 83. This was 74 percentile for age and sex matched control. 2. Normal coronary origin with right dominance. 3. CAD-RADS 4 Severe stenosis. (70-99%) prox LAD. Cardiac catheterization or CT FFR is recommended. Consider symptom-guided anti-ischemic pharmacotherapy as well as risk factor modification per guideline directed care.  FFR Summary: 1. LM FFR: 0.99 2. LAD FFR: Prox 0.94, mid 0.69, distal 0.62 3. CX FFR: Prox 0.98, mid 0.96, distal 0.86 4. RCA FFR: Prox 0.97, mid 0.93, distal 0.86  This study demonstrates HIGH likelihood of hemodynamically significant stenosis of the prox LAD.  Patient Profile   53 y.o. female with a history of CAD with severe LAD disease noted on recent coronary CTA on 11/21/2023, hypertension, hyperlipidemia, GERD, obstructive sleep apnea, and obesity who was admitted on 11/26/2023 for unstable angina after presenting with chest pain.  Assessment & Plan   Unstable Angina CAD Patient had recent coronary CTA last week that showed severe stenosis (70-99%) of proximal LAD. FFR was positive. Outpatient cardiac catheterization was arranged for 11/29/2023.  However, she now presents to the ED with constant chest pain since the evening of 10/24. EKG showed T wave inversions in aVL and biphasic T waves in leads V2 with appear worse. High-sensitivity 8 >> 7 >> 9 >> 30 >> 11.  - Curerntly having 4/10 chest pressure but feeling better than before and looks comfortably. - Continue IV Heparin .  - Continue Nitropaste. If she has worsening pain, may need to switch to IV Nitroglycerin. - Will add Amlodipine  5mg  daily. - Continue Coreg 12.5mg  twice daily.  - Continue Aspirin 81mg  daily. - Continue Crestor 20mg  daily. - Will go ahead and perform cardiac catheterization today The patient understands that risks include but are not limited to stroke (1 in 1000), death (1 in 1000), kidney failure [usually temporary] (1 in 500), bleeding (1 in 200), allergic reaction [possibly serious] (1 in 200), and agrees to proceed.    Hypertension BP initially as high as 181/108 but much improved this morning.  - Continue Coreg 12.5mg  twice daily.  - Will add Amlodipine  5mg  daily. - Continue Nitropaste.  Hyperlipidemia Lipid panel this admission: Total Cholesterol 153, Triglycerides 121, HDL 31, LDL 98. LDL goal <70. - Agree with increasing Crestor to 20mg  daily. - Will need repeat lipid panel and LFTs in 6-8 weeks.  Type 2 Diabetes Mellitus Hemoglobin A1c 7.5% this admission.  - Management per primary team.    For questions or updates, please contact  [facility] Please consult [email or web address] for contact info under       Signed, Callie E Goodrich, PA-C  11/27/2023, 8:56 AM     I have personally seen and examined this patient. I agree with the assessment and plan as outlined above.  Admitted with chest pain. Known severe LAD stenosis by coronary CTA.  Planning cardiac cath today No billing charge submitted today.   Lonni Cash, MD, [facility] 11/27/2023 9:45 AM

## 2023-11-27 NOTE — Consult Note (Cosign Needed)
 [address] [place] [address]             [phone]          [address] [place]       [place] [address]             [phone]        [name] [facility] Medical Record #[id number] Date of Birth: 11-02-1970  Referring: No ref. provider found Primary Care: [name], MD Primary Cardiologist:Sreedhar R Madireddy, MD  Chief Complaint:    Chief Complaint  Patient presents with   Chest Pain    History of Present Illness:   We are asked to see this 53 year old female in cardiothoracic surgical consultation for consideration of coronary artery surgical revascularization.  The patient presented to the emergency department on 11/26/2023 with chief complaint of chest pain.  She has multiple cardiac risk factors including hypertension, diabetes, obesity, hyperlipidemia and obstructive sleep apnea.  She reports that the pain is something she has noted since September and she was initially seen by cardiology and a CT coronary study showed proximal LAD lesion that was felt to be hemodynamically significant.  She describes the pain as substernal without radiation.  Initially it was felt to be primarily exertional but over time she developed resting symptoms.  In the ER initial troponins were obtained x 2 and were negative for elevation.  A chest CTA was obtained which was negative for dissection or intramural hematoma.  There is no evidence of aneurysm.  There was no evidence of pulmonary embolism within the limitations of this exam.  Incidental finding of an exophytic uterine leiomyoma measuring 5.3 cm superior to the fundus of the uterus was noted.  She was initially discharged but re- presented back to the emergency department on the same day with similar symptoms and her  troponin was 30 prompting cardiology consultation and subsequent admission.  It is noted that she was scheduled for cardiac catheterization on October 29 previously.  She  was admitted for further evaluation and treatment including cardiac catheterization which was done today.  This revealed a proximal LAD lesion that is 90% and a 70% first diagonal stenosis.  Anatomically this is not felt to be a good PCI situation and we are asked to consider surgical options.  Echocardiogram was performed on 11/15/2023 and she is noted to have normal LV and RV function no significant valvular disease.  Please see the full report as described below.    Current Activity/ Functional Status: Patient is independent with mobility/ambulation, transfers, ADL's, IADL's.   Zubrod Score: At the time of surgery this patient's most appropriate activity status/level should be described as: []     0    Normal activity, no symptoms [x]     1    Restricted in physical strenuous activity but ambulatory, able to do out light work []     2    Ambulatory and capable of self care, unable to do work activities, up and about                 more than 50%  Of the time                            []     3    Only limited self care, in bed greater than 50% of waking hours []   4    Completely disabled, no self care, confined to bed or chair []     5    Moribund  Past Medical History:  Diagnosis Date   Acute cystitis 08/11/2017   Anxiety and depression 08/11/2017   Atypical chest pain    BMI 40.0-44.9, adult (HCC)    Chest pain 08/11/2017   Constipation 08/14/2011   Cough 08/11/2017   Diabetes mellitus (HCC)    Diabetic gastroparesis associated with type 2 diabetes mellitus (HCC)    Dyspnea on exertion 08/11/2017   GERD (gastroesophageal reflux disease) 08/11/2017   Hoarseness 08/11/2017   HTN (hypertension) 08/09/2011   Intractable low back pain 08/07/2011   Left flank pain 08/11/2017   Lower urinary tract infectious disease 08/08/2011   IMO SNOMED Dx Update Oct 2024     Malaise and fatigue 08/11/2017   Morbid obesity (HCC)    Numbness of foot 08/11/2017   Obese    Oliguria 08/11/2017    Paresthesia 08/11/2017   Radiculopathy, lumbosacral region 08/08/2011   Sciatica 08/11/2017   Sinus tachycardia by electrocardiogram    Sleep apnea    Type 2 diabetes mellitus with hyperlipidemia [facility])     Past Surgical History:  Procedure Laterality Date   CESAREAN SECTION     x 4   hysterectomy surgery     lower back surgery  2013,2007   disc     Social History   Tobacco Use  Smoking Status Never  Smokeless Tobacco Never    Social History   Substance and Sexual Activity  Alcohol Use Yes   Comment: Patient drinks 1-2 drinks a year.     Allergies  Allergen Reactions   Bee Venom Anaphylaxis    Current Facility-Administered Medications  Medication Dose Route Frequency Provider Last Rate Last Admin   [MAR Hold] acetaminophen  (TYLENOL ) tablet 1,000 mg  1,000 mg Oral Q6H PRN Segars, Dorn, MD       [MAR Hold] albuterol (PROVENTIL) (2.5 MG/3ML) 0.083% nebulizer solution 2.5 mg  2.5 mg Nebulization Q4H PRN Segars, Jonathan, MD       [MAR Hold] amLODipine  (NORVASC ) tablet 5 mg  5 mg Oral Daily Goodrich, Callie E, PA-C   5 mg at 11/27/23 1104   [MAR Hold] aspirin chewable tablet 81 mg  81 mg Oral Daily Segars, Dorn, MD   81 mg at 11/27/23 1103   [MAR Hold] carvedilol (COREG) tablet 12.5 mg  12.5 mg Oral BID Segars, Jonathan, MD   12.5 mg at 11/27/23 1103   [MAR Hold] famotidine (PEPCID) tablet 20 mg  20 mg Oral BID PRN Keturah Dorn, MD       fentaNYL  (SUBLIMAZE ) injection    PRN Verlin Lonni BIRCH, MD   25 mcg at 11/27/23 1331   [MAR Hold] free water 500 mL  500 mL Oral Once Goodrich, Callie E, PA-C       Heparin  (Porcine) in NaCl 1000-0.9 UT/500ML-% SOLN    PRN Verlin Lonni BIRCH, MD   500 mL at 11/27/23 1329   heparin  ADULT infusion 100 units/mL (25000 units/250mL)  1,200 Units/hr Intravenous Continuous Laron Agent, RPH 12 mL/hr at 11/27/23 0458 1,200 Units/hr at 11/27/23 0458   heparin  sodium (porcine) injection    PRN Verlin Lonni BIRCH, MD    6,000 Units at 11/27/23 1350   hydrALAZINE (APRESOLINE) injection 10 mg  10 mg Intravenous Q20 Min PRN Verlin Lonni BIRCH, MD       [facility] Hold] insulin aspart (novoLOG) injection 0-6 Units  0-6 Units  Subcutaneous TID WC Segars, Jonathan, MD   1 Units at 11/27/23 0828   [MAR Hold] insulin glargine-yfgn (SEMGLEE) injection 8 Units  8 Units Subcutaneous Daily Segars, Jonathan, MD   8 Units at 11/27/23 1104   iohexol (OMNIPAQUE) 350 MG/ML injection    PRN Verlin Lonni BIRCH, MD   55 mL at 11/27/23 1404   labetalol (NORMODYNE) injection 10 mg  10 mg Intravenous Q10 min PRN Verlin Lonni BIRCH, MD       lidocaine  (PF) (XYLOCAINE ) 1 % injection    PRN Verlin Lonni BIRCH, MD   2 mL at 11/27/23 1346   [MAR Hold] melatonin tablet 6 mg  6 mg Oral QHS PRN Keturah Carrier, MD       midazolam  PF (VERSED ) injection    PRN Verlin Lonni BIRCH, MD   1 mg at 11/27/23 1331   [MAR Hold] nitroGLYCERIN (NITROGLYN) 2 % ointment 0.5 inch  0.5 inch Topical Q6H Segars, Carrier, MD   0.5 inch at 11/27/23 1302   [MAR Hold] ondansetron  (ZOFRAN ) injection 4 mg  4 mg Intravenous Q6H PRN Segars, Carrier, MD       ILDA Hold] polyethylene glycol (MIRALAX / GLYCOLAX) packet 17 g  17 g Oral Daily PRN Segars, Jonathan, MD       Radial Cocktail/Verapamil only    PRN Verlin Lonni BIRCH, MD   10 mL at 11/27/23 1346   [MAR Hold] rosuvastatin (CRESTOR) tablet 20 mg  20 mg Oral Daily Segars, Jonathan, MD   20 mg at 11/27/23 1103   [MAR Hold] sodium chloride  flush (NS) 0.9 % injection 3 mL  3 mL Intravenous Q12H Segars, Jonathan, MD   3 mL at 11/27/23 1110    Medications Prior to Admission  Medication Sig Dispense Refill Last Dose/Taking   carvedilol (COREG) 12.5 MG tablet Take 1 tablet (12.5 mg total) by mouth 2 (two) times daily. 180 tablet 3 11/25/2023   insulin glargine (LANTUS) 100 UNIT/ML injection Inject 15 Units into the skin daily.   Past Week   losartan (COZAAR) 25 MG tablet Take 1 tablet (25 mg  total) by mouth daily. 90 tablet 3 11/25/2023   methocarbamol  (ROBAXIN ) 500 MG tablet Take 500 mg by mouth 3 (three) times daily.   11/26/2023   OZEMPIC, 1 MG/DOSE, 4 MG/3ML SOPN Inject 1 mg into the skin once a week. Thursdays   11/16/2023   rosuvastatin (CRESTOR) 10 MG tablet Take 1 tablet (10 mg total) by mouth daily. 90 tablet 3 11/25/2023    Family History  Problem Relation Age of Onset   Diabetes Mother    CAD Mother    High blood pressure Maternal Grandmother    Diabetes Paternal Grandmother      Review of Systems:   Review of Systems  Constitutional:  Positive for malaise/fatigue.  Respiratory:  Positive for shortness of breath.   Cardiovascular:  Positive for chest pain, claudication and leg swelling.  Gastrointestinal:  Positive for nausea.  Musculoskeletal:  Positive for back pain and myalgias.  Neurological:  Positive for tingling.       Physical Exam: BP (!) 134/90   Pulse 93   Temp 99 F (37.2 C) (Oral)   Resp (!) 26   Ht 5' 2 (1.575 m)   Wt 109.4 kg   SpO2 94%   BMI 44.11 kg/m    General appearance: alert, cooperative, and no distress Head: Normocephalic, without obvious abnormality, atraumatic Neck: no adenopathy, no carotid bruit, no JVD, supple, symmetrical, trachea midline,  and thyroid not enlarged, symmetric, no tenderness/mass/nodules Lymph nodes: Cervical, supraclavicular, and axillary nodes normal. Resp: clear to auscultation bilaterally Back: symmetric, no curvature. ROM normal. No CVA tenderness. Cardio: regular rate and rhythm, S1, S2 normal, no murmur, click, rub or gallop GI: soft, non-tender; bowel sounds normal; no masses,  no organomegaly and obese Extremities: Minor foot and ankle edema, palpable DP bilaterally, absent PT bilaterally Neurologic: Grossly normal  Diagnostic Studies & Laboratory data:     Recent Radiology Findings:   CARDIAC CATHETERIZATION Result Date: 11/27/2023   Prox RCA to Mid RCA lesion is 20% stenosed.    Prox LAD lesion is 90% stenosed.   1st Diag lesion is 70% stenosed. Severe stenosis proximal LAD involving the large caliber diagonal branch. The Diagonal branch has at least moderate ostial stenosis. No obstructive disease in the circumflex Large dominant RCA with mild non-obstructive disease. Elevated LVEDP. Recommendations: Her severe stenosis is in the proximal LAD and involves a large caliber diagonal branch. PCI of the LAD would compromise flow into the Diagonal branch. Bifurcation stenting of the LAD/Diagonal would leave her with two layers of stent in the proximal LAD. I think the best option for revascularization is bypass of the LAD/Diagonal (LIMA and possible radial artery graft to the Diagonal). Will continue ASA and statin. Consult to CT surgery for bypass. Will diurese with IV Lasix given elevated LV filling pressure.   CT Angio Chest/Abd/Pel for Dissection W and/or Wo Contrast Result Date: 11/26/2023 EXAM: CTA CHEST, ABDOMEN AND PELVIS WITH AND WITHOUT CONTRAST 11/26/2023 09:33:54 AM TECHNIQUE: CTA of the chest was performed with and without the administration of 100 mL of intravenous contrast (iohexol (OMNIPAQUE) 350 MG/ML injection 100 mL IOHEXOL 350 MG/ML SOLN). CTA of the abdomen and pelvis was performed with and without the administration of 100 mL of intravenous contrast (iohexol (OMNIPAQUE) 350 MG/ML injection 100 mL IOHEXOL 350 MG/ML SOLN). Multiplanar reformatted images are provided for review. MIP images are provided for review. Automated exposure control, iterative reconstruction, and/or weight based adjustment of the mA/kV was utilized to reduce the radiation dose to as low as reasonably achievable. COMPARISON: None available. CLINICAL HISTORY: Acute aortic syndrome (AAS) suspected. Pt complains of tearing chest pain that radiates to the back. No infectious symptoms. FINDINGS: VASCULATURE: AORTA: Noncontrast CT imaging demonstrates no intramural hematoma within the thoracic aorta.  Contrast enhanced series demonstrates no aortic dissection or aneurysm. No abdominal aortic aneurysm. PULMONARY ARTERIES: No pulmonary embolism within the limits of this exam. GREAT VESSELS OF AORTIC ARCH: Great vessels are normal. No dissection. No arterial occlusion or significant stenosis. CELIAC TRUNK: No acute finding. No occlusion or significant stenosis. SUPERIOR MESENTERIC ARTERY: No acute finding. No occlusion or significant stenosis. INFERIOR MESENTERIC ARTERY: No acute finding. No occlusion or significant stenosis. RENAL ARTERIES: No acute finding. No occlusion or significant stenosis. ILIAC ARTERIES: No acute finding. No occlusion or significant stenosis. CHEST: MEDIASTINUM: No mediastinal lymphadenopathy. The heart and pericardium demonstrate no acute abnormality. No pericardial fluid. No mediastinal hematoma. LUNGS AND PLEURA: The lungs are without acute process. No focal consolidation or pulmonary edema. No pulmonary infarction. No pneumonia. No pleural fluid. No pneumothorax. Incidental finding of azygos lobe in the right lung. THORACIC BONES AND SOFT TISSUES: No acute bone or soft tissue abnormality. ABDOMEN AND PELVIS: LIVER: The liver is unremarkable. GALLBLADDER AND BILE DUCTS: Gallbladder is unremarkable. No biliary ductal dilatation. SPLEEN: The spleen is unremarkable. PANCREAS: The pancreas is unremarkable. ADRENAL GLANDS: Bilateral adrenal glands demonstrate no acute abnormality. KIDNEYS, URETERS  AND BLADDER: No stones in the kidneys or ureters. No hydronephrosis. No perinephric or periureteral stranding. Urinary bladder is unremarkable. GI AND BOWEL: Stomach and duodenal sweep demonstrate no acute abnormality. There is no bowel obstruction. No abnormal bowel wall thickening or distension. REPRODUCTIVE: Within the pelvis there is a rounded 5.3 cm mass superior to the fundus of the uterus. Findings most consistent with exophytic leiomyoma. PERITONEUM AND RETROPERITONEUM: No ascites or free  air. LYMPH NODES: No lymphadenopathy. ABDOMINAL BONES AND SOFT TISSUES: Posterior lumbar fusion and posterior lumbar decompression. No acute soft tissue abnormality. IMPRESSION: 1. No evidence of acute aortic syndrome, including dissection or intramural hematoma. 2. 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 3. Posterior lumbar fusion without complication. Electronically signed by: Norleen Boxer MD 11/26/2023 10:10 AM EDT RP Workstation: [id number]   DG Chest 2 View Result Date: 11/26/2023 EXAM: 2 VIEW(S) XRAY OF THE CHEST 11/26/2023 05:02:01 AM COMPARISON: None available. CLINICAL HISTORY: CP. 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 now CP midsternum with pressure that radiates to the back. FINDINGS: LUNGS AND PLEURA: No focal pulmonary opacity. No pulmonary edema. No pleural effusion. No pneumothorax. HEART AND MEDIASTINUM: No acute abnormality of the cardiac and mediastinal silhouettes. BONES AND SOFT TISSUES: No acute osseous abnormality. IMPRESSION: 1. No acute cardiopulmonary process identified. Electronically signed by: Waddell Calk MD 11/26/2023 05:24 AM EDT RP Workstation: [id number]       ECHOCARDIOGRAM REPORT       Patient Name:   Jasmin Gordon [facility] Date of Exam: 11/15/2023  Medical Rec #:  [id number]     Height:       63.0 in  Accession #:    [id number]    Weight:       233.6 lb  Date of Birth:  1970/05/19     BSA:          2.066 m  Patient Age:    53 years      BP:           170/122 mmHg  Patient Gender: F             HR:           97 bpm.  Exam Location:  [place]   Procedure: 2D Echo, Cardiac Doppler, Color Doppler and Strain Analysis  (Both            Spectral and Color Flow Doppler were utilized during  procedure).   Indications:   Precordial pain [R07.2 (ICD-10-CM)]    History:        Patient has prior history of Echocardiogram examinations,  most                 recent 10/29/2022.  Arrythmias:Tachycardia,                  Signs/Symptoms:Fatigue, Dyspnea and Chest Pain; Risk                  Factors:Hypertension, Sleep Apnea and Diabetes. Prior  echo-                 55-60% EF.    Sonographer:    Saddie Chimes  Referring Phys: [id number] SREEDHAR R MADIREDDY   IMPRESSIONS     1. Left ventricular ejection fraction, by estimation, is 60 to 65%. The  left ventricle has normal function. The left ventricle has no regional  wall motion abnormalities. There is mild left ventricular hypertrophy.  Left  ventricular diastolic parameters  are consistent with Grade I diastolic dysfunction (impaired relaxation).  The average left ventricular global longitudinal strain is 18.5 %. The  global longitudinal strain is normal.   2. Right ventricular systolic function is normal. The right ventricular  size is normal. There is normal pulmonary artery systolic pressure.   3. The mitral valve is normal in structure. No evidence of mitral valve  regurgitation. No evidence of mitral stenosis.   4. The aortic valve is normal in structure. Aortic valve regurgitation is  not visualized. No aortic stenosis is present.   5. The inferior vena cava is normal in size with greater than 50%  respiratory variability, suggesting right atrial pressure of 3 mmHg.   FINDINGS   Left Ventricle: Left ventricular ejection fraction, by estimation, is 60  to 65%. The left ventricle has normal function. The left ventricle has no  regional wall motion abnormalities. The average left ventricular global  longitudinal strain is 18.5 %.  Strain was performed and the global longitudinal strain is normal. The  left ventricular internal cavity size was normal in size. There is mild  left ventricular hypertrophy. Left ventricular diastolic parameters are  consistent with Grade I diastolic  dysfunction (impaired relaxation).   Right Ventricle: The right ventricular size is normal. No increase in  right ventricular  wall thickness. Right ventricular systolic function is  normal. There is normal pulmonary artery systolic pressure. The tricuspid  regurgitant velocity is 0.84 m/s, and   with an assumed right atrial pressure of 3 mmHg, the estimated right  ventricular systolic pressure is 5.8 mmHg.   Left Atrium: Left atrial size was normal in size.   Right Atrium: Right atrial size was normal in size.   Pericardium: There is no evidence of pericardial effusion.   Mitral Valve: The mitral valve is normal in structure. No evidence of  mitral valve regurgitation. No evidence of mitral valve stenosis. MV peak  gradient, 4.7 mmHg. The mean mitral valve gradient is 2.0 mmHg.   Tricuspid Valve: The tricuspid valve is normal in structure. Tricuspid  valve regurgitation is not demonstrated. No evidence of tricuspid  stenosis.   Aortic Valve: The aortic valve is normal in structure. Aortic valve  regurgitation is not visualized. No aortic stenosis is present. Aortic  valve mean gradient measures 4.2 mmHg. Aortic valve peak gradient measures  7.8 mmHg. Aortic valve area, by VTI  measures 2.21 cm.   Pulmonic Valve: The pulmonic valve was normal in structure. Pulmonic valve  regurgitation is not visualized. No evidence of pulmonic stenosis.   Aorta: The aortic root is normal in size and structure.   Venous: The inferior vena cava is normal in size with greater than 50%  respiratory variability, suggesting right atrial pressure of 3 mmHg.   IAS/Shunts: No atrial level shunt detected by color flow Doppler.     LEFT VENTRICLE  PLAX 2D  LVIDd:         4.60 cm   Diastology  LVIDs:         2.90 cm   LV e' medial:    6.42 cm/s  LV PW:         1.30 cm   LV E/e' medial:  11.9  LV IVS:        1.60 cm   LV e' lateral:   9.79 cm/s  LVOT diam:     1.90 cm   LV E/e' lateral: 7.8  LV SV:  53  LV SV Index:   26        2D Longitudinal Strain  LVOT Area:     2.84 cm  2D Strain GLS Avg:     18.5 %      RIGHT VENTRICLE  RV Basal diam:  3.40 cm  RV Mid diam:    2.50 cm  TAPSE (M-mode): 2.3 cm   LEFT ATRIUM           Index  LA diam:      3.20 cm 1.55 cm/m  LA Vol (A4C): 40.9 ml 19.80 ml/m   AORTIC VALVE                    PULMONIC VALVE  AV Area (Vmax):    2.09 cm     PV Vmax:       1.10 m/s  AV Area (Vmean):   2.13 cm     PV Vmean:      71.200 cm/s  AV Area (VTI):     2.21 cm     PV VTI:        0.220 m  AV Vmax:           140.00 cm/s  PV Peak grad:  4.8 mmHg  AV Vmean:          96.250 cm/s  PV Mean grad:  2.0 mmHg  AV VTI:            0.242 m  AV Peak Grad:      7.8 mmHg  AV Mean Grad:      4.2 mmHg  LVOT Vmax:         103.00 cm/s  LVOT Vmean:        72.333 cm/s  LVOT VTI:          0.188 m  LVOT/AV VTI ratio: 0.78    AORTA  Ao Asc diam: 3.80 cm   MITRAL VALVE                TRICUSPID VALVE  MV Area (PHT): 4.91 cm     TR Peak grad:   2.8 mmHg  MV Area VTI:   2.47 cm     TR Vmax:        84.10 cm/s  MV Peak grad:  4.7 mmHg  MV Mean grad:  2.0 mmHg     SHUNTS  MV Vmax:       1.08 m/s     Systemic VTI:  0.19 m  MV Vmean:      56.4 cm/s    Systemic Diam: 1.90 cm  MV Decel Time: 155 msec  MV E velocity: 76.17 cm/s  MV A velocity: 103.00 cm/s  MV E/A ratio:  0.74   Lamar Fitch MD  Electronically signed by Lamar Fitch MD  Signature Date/Time: 11/15/2023/5:01:26 PM       I have independently reviewed the above radiologic studies and discussed with the patient   Recent Lab Findings: Lab Results  Component Value Date   WBC 11.3 (H) 11/27/2023   HGB 12.7 11/27/2023   HCT 37.9 11/27/2023   PLT 236 11/27/2023   GLUCOSE 213 (H) 11/27/2023   CHOL 153 11/27/2023   TRIG 121 11/27/2023   HDL 31 (L) 11/27/2023   LDLCALC 98 11/27/2023   ALT 30 06/05/2012   AST 24 06/05/2012   NA 137 11/27/2023   K 3.6 11/27/2023   CL 105 11/27/2023   CREATININE 0.52 11/27/2023   BUN 10  11/27/2023   CO2 22 11/27/2023   TSH 3.790 06/05/2012   HGBA1C 7.5 (H) 11/27/2023       Assessment / Plan: Severe LAD-diagonal coronary artery disease in the setting of unstable angina. Hypertension History of radiculopathy and lumbar back surgery x 2 Sleep apnea GERD DM2 Obesity Hyperlipidemia  Plan: The patient appears to be an adequate candidate for CABG.  She will be seen by the surgeon to make final recommendations.  Tentatively, the patient's procedure is scheduled for Wednesday of this week.  I  spent 30 minutes counseling the patient face to face.   Lemond FORBES Cera, PA-C  11/27/2023 3:02 PM   Patient seen and examined, records and cath images reviewed.  53 yo woman with multiple cardiac risk factors and accelerating angina.  Severe CAD involving proximal LAD and ostium of large 1st diagonal.  CABG indicated for relief of symptoms.  I discussed the general nature of the procedure, including the need for general anesthesia, the incisions to be used, the use of cardiopulmonary bypass, and the use of temporary pacemaker wires and drainage tubes postoperatively with Ms. Mathia.  We discussed the expected hospital stay, overall recovery and short and long term outcomes. I informed her of the indications, risks, benefits and alternatives.   She understands the risks include, but are not limited to death, stroke, MI, DVT/PE, bleeding, possible need for transfusion, infections, cardiac arrhythmias, as well as other organ system dysfunction including respiratory, renal, or GI complications.    Normal Allen's test on left- will plan to use left radial artery in addition to LIMA.  Elspeth MOTE Kerrin, MD [facility] [phone]

## 2023-11-27 NOTE — H&P (View-Only) (Signed)
 Rounding Note   Patient Name: Jasmin Gordon Date of Encounter: 11/27/2023  [facility] Cardiologist: Alean SAUNDERS Madireddy, MD   Subjective Patient reports constant chest pain that has been waxing and waning since the evening of 10/25. She is currently have 4/10 chest pressure but states this is improved from before and looks comfortable. She also reports exertional shortness of breath for the last month.   Scheduled Meds:  amLODipine   5 mg Oral Daily   aspirin  81 mg Oral Daily   carvedilol  12.5 mg Oral BID   insulin aspart  0-6 Units Subcutaneous TID WC   insulin glargine-yfgn  8 Units Subcutaneous Daily   nitroGLYCERIN  0.5 inch Topical Q6H   rosuvastatin  20 mg Oral Daily   sodium chloride  flush  3 mL Intravenous Q12H   Continuous Infusions:  heparin  1,200 Units/hr (11/27/23 0458)   PRN Meds: acetaminophen , albuterol, famotidine, melatonin, ondansetron  (ZOFRAN ) IV, polyethylene glycol   Vital Signs  Vitals:   11/27/23 0415 11/27/23 0503 11/27/23 0530 11/27/23 0645  BP: (!) 146/88 (!) 146/88 122/84 123/74  Pulse: 88 88 93 89  Resp: (!) 21 (!) 21 18 13   Temp:  97.8 F (36.6 C)    TempSrc:  Oral    SpO2: 97% 97% 99% 100%  Weight:      Height:        Intake/Output Summary (Last 24 hours) at 11/27/2023 0856 Last data filed at 11/26/2023 2049 Gross per 24 hour  Intake 30 ml  Output --  Net 30 ml      11/27/2023   12:05 AM 11/26/2023    4:39 AM 11/23/2023   10:50 AM  Last 3 Weights  Weight (lbs) 241 lb 2.9 oz 241 lb 3.2 oz 241 lb 3.2 oz  Weight (kg) 109.4 kg 109.408 kg 109.408 kg      Telemetry Normal sinus rhythm with rates in the 80s to 90s. - Personally Reviewed  ECG  No new ECG tracing today. - Personally Reviewed  Physical Exam  GEN: Obese female resting comfortably in no acute distress.   Neck: JVD difficult to assess due to body habitus. Cardiac: Mildly bradycardic with normal rate. No murmurs, rubs, or gallops. Radial pulses and  distal pedal pulses 2+ and equal bilaterally.  Respiratory: Clear to auscultation bilaterally. No wheezes, rhonchi, or rales. MS: No lower extremity edema. No deformity.  Neuro:  No focal deficits.  Psych: Normal affect   Labs High Sensitivity Troponin:   Recent Labs  Lab 11/26/23 0452 11/26/23 0752 11/26/23 1650 11/26/23 1850 11/27/23 0026  TROPONINIHS 8 7 9  30* 11     Chemistry Recent Labs  Lab 11/23/23 1202 11/26/23 0452 11/27/23 0356  NA 137 134* 137  K 4.1 4.0 3.6  CL 101 102 105  CO2 22 20* 22  GLUCOSE 126* 231* 213*  BUN 14 14 10   CREATININE 0.56* 0.65 0.52  CALCIUM 9.1 8.8* 8.7*  MG  --   --  1.8  GFRNONAA  --  >60 >60  ANIONGAP  --  12 10    Lipids  Recent Labs  Lab 11/27/23 0356  CHOL 153  TRIG 121  HDL 31*  LDLCALC 98  CHOLHDL 4.9    Hematology Recent Labs  Lab 11/23/23 1202 11/26/23 0452 11/27/23 0356  WBC 12.0* 16.4* 11.3*  RBC 4.85 4.60 4.32  HGB 14.1 13.6 12.7  HCT 43.7 39.7 37.9  MCV 90 86.3 87.7  MCH 29.1 29.6 29.4  MCHC 32.3  34.3 33.5  RDW 12.7 12.7 12.7  PLT 374 349 236   Thyroid No results for input(s): TSH, FREET4 in the last 168 hours.  BNPNo results for input(s): BNP, PROBNP in the last 168 hours.  DDimer No results for input(s): DDIMER in the last 168 hours.   Radiology  CT Angio Chest/Abd/Pel for Dissection W and/or Wo Contrast Result Date: 11/26/2023 EXAM: CTA CHEST, ABDOMEN AND PELVIS WITH AND WITHOUT CONTRAST 11/26/2023 09:33:54 AM TECHNIQUE: CTA of the chest was performed with and without the administration of 100 mL of intravenous contrast (iohexol (OMNIPAQUE) 350 MG/ML injection 100 mL IOHEXOL 350 MG/ML SOLN). CTA of the abdomen and pelvis was performed with and without the administration of 100 mL of intravenous contrast (iohexol (OMNIPAQUE) 350 MG/ML injection 100 mL IOHEXOL 350 MG/ML SOLN). Multiplanar reformatted images are provided for review. MIP images are provided for review. Automated exposure  control, iterative reconstruction, and/or weight based adjustment of the mA/kV was utilized to reduce the radiation dose to as low as reasonably achievable. COMPARISON: None available. CLINICAL HISTORY: Acute aortic syndrome (AAS) suspected. Pt complains of tearing chest pain that radiates to the back. No infectious symptoms. FINDINGS: VASCULATURE: AORTA: Noncontrast CT imaging demonstrates no intramural hematoma within the thoracic aorta. Contrast enhanced series demonstrates no aortic dissection or aneurysm. No abdominal aortic aneurysm. PULMONARY ARTERIES: No pulmonary embolism within the limits of this exam. GREAT VESSELS OF AORTIC ARCH: Great vessels are normal. No dissection. No arterial occlusion or significant stenosis. CELIAC TRUNK: No acute finding. No occlusion or significant stenosis. SUPERIOR MESENTERIC ARTERY: No acute finding. No occlusion or significant stenosis. INFERIOR MESENTERIC ARTERY: No acute finding. No occlusion or significant stenosis. RENAL ARTERIES: No acute finding. No occlusion or significant stenosis. ILIAC ARTERIES: No acute finding. No occlusion or significant stenosis. CHEST: MEDIASTINUM: No mediastinal lymphadenopathy. The heart and pericardium demonstrate no acute abnormality. No pericardial fluid. No mediastinal hematoma. LUNGS AND PLEURA: The lungs are without acute process. No focal consolidation or pulmonary edema. No pulmonary infarction. No pneumonia. No pleural fluid. No pneumothorax. Incidental finding of azygos lobe in the right lung. THORACIC BONES AND SOFT TISSUES: No acute bone or soft tissue abnormality. ABDOMEN AND PELVIS: LIVER: The liver is unremarkable. GALLBLADDER AND BILE DUCTS: Gallbladder is unremarkable. No biliary ductal dilatation. SPLEEN: The spleen is unremarkable. PANCREAS: The pancreas is unremarkable. ADRENAL GLANDS: Bilateral adrenal glands demonstrate no acute abnormality. KIDNEYS, URETERS AND BLADDER: No stones in the kidneys or ureters. No  hydronephrosis. No perinephric or periureteral stranding. Urinary bladder is unremarkable. GI AND BOWEL: Stomach and duodenal sweep demonstrate no acute abnormality. There is no bowel obstruction. No abnormal bowel wall thickening or distension. REPRODUCTIVE: Within the pelvis there is a rounded 5.3 cm mass superior to the fundus of the uterus. Findings most consistent with exophytic leiomyoma. PERITONEUM AND RETROPERITONEUM: No ascites or free air. LYMPH NODES: No lymphadenopathy. ABDOMINAL BONES AND SOFT TISSUES: Posterior lumbar fusion and posterior lumbar decompression. No acute soft tissue abnormality. IMPRESSION: 1. No evidence of acute aortic syndrome, including dissection or intramural hematoma. 2. 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 3. Posterior lumbar fusion without complication. Electronically signed by: Norleen Boxer MD 11/26/2023 10:10 AM EDT RP Workstation: [id number]   DG Chest 2 View Result Date: 11/26/2023 EXAM: 2 VIEW(S) XRAY OF THE CHEST 11/26/2023 05:02:01 AM COMPARISON: None available. CLINICAL HISTORY: CP. 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 now CP midsternum with pressure that radiates  to the back. FINDINGS: LUNGS AND PLEURA: No focal pulmonary opacity. No pulmonary edema. No pleural effusion. No pneumothorax. HEART AND MEDIASTINUM: No acute abnormality of the cardiac and mediastinal silhouettes. BONES AND SOFT TISSUES: No acute osseous abnormality. IMPRESSION: 1. No acute cardiopulmonary process identified. Electronically signed by: Waddell Calk MD 11/26/2023 05:24 AM EDT RP Workstation: [id number]    Cardiac Studies  Echocardiogram 11/15/2023: Impressions:  1. Left ventricular ejection fraction, by estimation, is 60 to 65%. The  left ventricle has normal function. The left ventricle has no regional  wall motion abnormalities. There is mild left ventricular hypertrophy.  Left ventricular  diastolic parameters  are consistent with Grade I diastolic dysfunction (impaired relaxation).  The average left ventricular global longitudinal strain is 18.5 %. The  global longitudinal strain is normal.   2. Right ventricular systolic function is normal. The right ventricular  size is normal. There is normal pulmonary artery systolic pressure.   3. The mitral valve is normal in structure. No evidence of mitral valve  regurgitation. No evidence of mitral stenosis.   4. The aortic valve is normal in structure. Aortic valve regurgitation is  not visualized. No aortic stenosis is present.   5. The inferior vena cava is normal in size with greater than 50%  respiratory variability, suggesting right atrial pressure of 3 mmHg.  _______________  Coronary CTA 11/21/2023: Summary: 1. Coronary calcium score of 83. This was 74 percentile for age and sex matched control. 2. Normal coronary origin with right dominance. 3. CAD-RADS 4 Severe stenosis. (70-99%) prox LAD. Cardiac catheterization or CT FFR is recommended. Consider symptom-guided anti-ischemic pharmacotherapy as well as risk factor modification per guideline directed care.  FFR Summary: 1. LM FFR: 0.99 2. LAD FFR: Prox 0.94, mid 0.69, distal 0.62 3. CX FFR: Prox 0.98, mid 0.96, distal 0.86 4. RCA FFR: Prox 0.97, mid 0.93, distal 0.86  This study demonstrates HIGH likelihood of hemodynamically significant stenosis of the prox LAD.  Patient Profile   53 y.o. female with a history of CAD with severe LAD disease noted on recent coronary CTA on 11/21/2023, hypertension, hyperlipidemia, GERD, obstructive sleep apnea, and obesity who was admitted on 11/26/2023 for unstable angina after presenting with chest pain.  Assessment & Plan   Unstable Angina CAD Patient had recent coronary CTA last week that showed severe stenosis (70-99%) of proximal LAD. FFR was positive. Outpatient cardiac catheterization was arranged for 11/29/2023.  However, she now presents to the ED with constant chest pain since the evening of 10/24. EKG showed T wave inversions in aVL and biphasic T waves in leads V2 with appear worse. High-sensitivity 8 >> 7 >> 9 >> 30 >> 11.  - Curerntly having 4/10 chest pressure but feeling better than before and looks comfortably. - Continue IV Heparin .  - Continue Nitropaste. If she has worsening pain, may need to switch to IV Nitroglycerin. - Will add Amlodipine  5mg  daily. - Continue Coreg 12.5mg  twice daily.  - Continue Aspirin 81mg  daily. - Continue Crestor 20mg  daily. - Will go ahead and perform cardiac catheterization today The patient understands that risks include but are not limited to stroke (1 in 1000), death (1 in 1000), kidney failure [usually temporary] (1 in 500), bleeding (1 in 200), allergic reaction [possibly serious] (1 in 200), and agrees to proceed.    Hypertension BP initially as high as 181/108 but much improved this morning.  - Continue Coreg 12.5mg  twice daily.  - Will add Amlodipine  5mg  daily. - Continue Nitropaste.  Hyperlipidemia Lipid panel this admission: Total Cholesterol 153, Triglycerides 121, HDL 31, LDL 98. LDL goal <70. - Agree with increasing Crestor to 20mg  daily. - Will need repeat lipid panel and LFTs in 6-8 weeks.  Type 2 Diabetes Mellitus Hemoglobin A1c 7.5% this admission.  - Management per primary team.    For questions or updates, please contact  [facility] Please consult [email or web address] for contact info under       Signed, Callie E Goodrich, PA-C  11/27/2023, 8:56 AM     I have personally seen and examined this patient. I agree with the assessment and plan as outlined above.  Admitted with chest pain. Known severe LAD stenosis by coronary CTA.  Planning cardiac cath today No billing charge submitted today.   Lonni Cash, MD, [facility] 11/27/2023 9:45 AM

## 2023-11-27 NOTE — Progress Notes (Signed)
 Received patient with chest pain 5/10 lasted for a few minutes. Patient again had another chest pain 5/10 at rest. Applied nitroglycerin paste ordered and cardiology PA made aware. Received order to obtain EKG.  Loyce Blazing, RN

## 2023-11-27 NOTE — Progress Notes (Signed)
   Notified by RN that patient was having some chest pain after coming back from the Cath Lab.  Repeat EKG showed normal sinus rhythm with T wave inversions in the lateral leads and lead V2 but no significant changes compared to EKG yesterday.  LHC showed 90% stenosis of proximal LAD and 70% stenosis of 1st Diag.  The surgery was consulted and looks like plan is for CABG on 11/29/2023.  I went to see patient.  She is resting comfortably in no acute distress. No adventitious heart or lung sounds on exam.  She describes intermittent sharp chest pain.  She initially had this pain while getting up to use the restroom and then again while lying in bed but it went away.  Pain returned we were talking.  Telemetry shows sinus rhythm.  She really has Nitropaste ordered.  I am going to stop this and start IV nitro instead.  She also scribes some chest wall pain that is reproducible with palpation.  Will give a dose of Tylenol  to see this helps.  Asked patient to let us  know if symptoms worsen or do not improve.  Updated RN.  Jameela Michna E Lashawna Poche, PA-C 11/27/2023 6:03 PM

## 2023-11-27 NOTE — H&P (View-Only) (Signed)
 [address] [place] [address]             [phone]          [address] [place]       [place] [address]             [phone]        Jasmin Gordon [facility] Medical Record #[id number] Date of Birth: February 22, 1970  Referring: No ref. provider found Primary Care: [name], MD Primary Cardiologist:Sreedhar R Madireddy, MD  Chief Complaint:    Chief Complaint  Patient presents with   Chest Pain    History of Present Illness:   We are asked to see this 53 year old female in cardiothoracic surgical consultation for consideration of coronary artery surgical revascularization.  The patient presented to the emergency department on 11/26/2023 with chief complaint of chest pain.  She has multiple cardiac risk factors including hypertension, diabetes, obesity, hyperlipidemia and obstructive sleep apnea.  She reports that the pain is something she has noted since September and she was initially seen by cardiology and a CT coronary study showed proximal LAD lesion that was felt to be hemodynamically significant.  She describes the pain as substernal without radiation.  Initially it was felt to be primarily exertional but over time she developed resting symptoms.  In the ER initial troponins were obtained x 2 and were negative for elevation.  A chest CTA was obtained which was negative for dissection or intramural hematoma.  There is no evidence of aneurysm.  There was no evidence of pulmonary embolism within the limitations of this exam.  Incidental finding of an exophytic uterine leiomyoma measuring 5.3 cm superior to the fundus of the uterus was noted.  She was initially discharged but re- presented back to the emergency department on the same day with similar symptoms and her  troponin was 30 prompting cardiology consultation and subsequent admission.  It is noted that she was scheduled for cardiac catheterization on October 29 previously.  She  was admitted for further evaluation and treatment including cardiac catheterization which was done today.  This revealed a proximal LAD lesion that is 90% and a 70% first diagonal stenosis.  Anatomically this is not felt to be a good PCI situation and we are asked to consider surgical options.  Echocardiogram was performed on 11/15/2023 and she is noted to have normal LV and RV function no significant valvular disease.  Please see the full report as described below.    Current Activity/ Functional Status: Patient is independent with mobility/ambulation, transfers, ADL's, IADL's.   Zubrod Score: At the time of surgery this patient's most appropriate activity status/level should be described as: []     0    Normal activity, no symptoms [x]     1    Restricted in physical strenuous activity but ambulatory, able to do out light work []     2    Ambulatory and capable of self care, unable to do work activities, up and about                 more than 50%  Of the time                            []     3    Only limited self care, in bed greater than 50% of waking hours []   4    Completely disabled, no self care, confined to bed or chair []     5    Moribund  Past Medical History:  Diagnosis Date   Acute cystitis 08/11/2017   Anxiety and depression 08/11/2017   Atypical chest pain    BMI 40.0-44.9, adult (HCC)    Chest pain 08/11/2017   Constipation 08/14/2011   Cough 08/11/2017   Diabetes mellitus (HCC)    Diabetic gastroparesis associated with type 2 diabetes mellitus (HCC)    Dyspnea on exertion 08/11/2017   GERD (gastroesophageal reflux disease) 08/11/2017   Hoarseness 08/11/2017   HTN (hypertension) 08/09/2011   Intractable low back pain 08/07/2011   Left flank pain 08/11/2017   Lower urinary tract infectious disease 08/08/2011   IMO SNOMED Dx Update Oct 2024     Malaise and fatigue 08/11/2017   Morbid obesity (HCC)    Numbness of foot 08/11/2017   Obese    Oliguria 08/11/2017    Paresthesia 08/11/2017   Radiculopathy, lumbosacral region 08/08/2011   Sciatica 08/11/2017   Sinus tachycardia by electrocardiogram    Sleep apnea    Type 2 diabetes mellitus with hyperlipidemia [facility])     Past Surgical History:  Procedure Laterality Date   CESAREAN SECTION     x 4   hysterectomy surgery     lower back surgery  2013,2007   disc     Social History   Tobacco Use  Smoking Status Never  Smokeless Tobacco Never    Social History   Substance and Sexual Activity  Alcohol Use Yes   Comment: Patient drinks 1-2 drinks a year.     Allergies  Allergen Reactions   Bee Venom Anaphylaxis    Current Facility-Administered Medications  Medication Dose Route Frequency Provider Last Rate Last Admin   [MAR Hold] acetaminophen  (TYLENOL ) tablet 1,000 mg  1,000 mg Oral Q6H PRN Segars, Dorn, MD       [MAR Hold] albuterol (PROVENTIL) (2.5 MG/3ML) 0.083% nebulizer solution 2.5 mg  2.5 mg Nebulization Q4H PRN Segars, Jonathan, MD       [MAR Hold] amLODipine  (NORVASC ) tablet 5 mg  5 mg Oral Daily Goodrich, Callie E, PA-C   5 mg at 11/27/23 1104   [MAR Hold] aspirin chewable tablet 81 mg  81 mg Oral Daily Segars, Dorn, MD   81 mg at 11/27/23 1103   [MAR Hold] carvedilol (COREG) tablet 12.5 mg  12.5 mg Oral BID Segars, Jonathan, MD   12.5 mg at 11/27/23 1103   [MAR Hold] famotidine (PEPCID) tablet 20 mg  20 mg Oral BID PRN Keturah Dorn, MD       fentaNYL  (SUBLIMAZE ) injection    PRN Verlin Lonni BIRCH, MD   25 mcg at 11/27/23 1331   [MAR Hold] free water 500 mL  500 mL Oral Once Goodrich, Callie E, PA-C       Heparin  (Porcine) in NaCl 1000-0.9 UT/500ML-% SOLN    PRN Verlin Lonni BIRCH, MD   500 mL at 11/27/23 1329   heparin  ADULT infusion 100 units/mL (25000 units/250mL)  1,200 Units/hr Intravenous Continuous Laron Agent, RPH 12 mL/hr at 11/27/23 0458 1,200 Units/hr at 11/27/23 0458   heparin  sodium (porcine) injection    PRN Verlin Lonni BIRCH, MD    6,000 Units at 11/27/23 1350   hydrALAZINE (APRESOLINE) injection 10 mg  10 mg Intravenous Q20 Min PRN Verlin Lonni BIRCH, MD       [facility] Hold] insulin aspart (novoLOG) injection 0-6 Units  0-6 Units  Subcutaneous TID WC Segars, Jonathan, MD   1 Units at 11/27/23 0828   [MAR Hold] insulin glargine-yfgn (SEMGLEE) injection 8 Units  8 Units Subcutaneous Daily Segars, Jonathan, MD   8 Units at 11/27/23 1104   iohexol (OMNIPAQUE) 350 MG/ML injection    PRN Verlin Lonni BIRCH, MD   55 mL at 11/27/23 1404   labetalol (NORMODYNE) injection 10 mg  10 mg Intravenous Q10 min PRN Verlin Lonni BIRCH, MD       lidocaine  (PF) (XYLOCAINE ) 1 % injection    PRN Verlin Lonni BIRCH, MD   2 mL at 11/27/23 1346   [MAR Hold] melatonin tablet 6 mg  6 mg Oral QHS PRN Keturah Carrier, MD       midazolam  PF (VERSED ) injection    PRN Verlin Lonni BIRCH, MD   1 mg at 11/27/23 1331   [MAR Hold] nitroGLYCERIN (NITROGLYN) 2 % ointment 0.5 inch  0.5 inch Topical Q6H Segars, Carrier, MD   0.5 inch at 11/27/23 1302   [MAR Hold] ondansetron  (ZOFRAN ) injection 4 mg  4 mg Intravenous Q6H PRN Segars, Carrier, MD       ILDA Hold] polyethylene glycol (MIRALAX / GLYCOLAX) packet 17 g  17 g Oral Daily PRN Segars, Jonathan, MD       Radial Cocktail/Verapamil only    PRN Verlin Lonni BIRCH, MD   10 mL at 11/27/23 1346   [MAR Hold] rosuvastatin (CRESTOR) tablet 20 mg  20 mg Oral Daily Segars, Jonathan, MD   20 mg at 11/27/23 1103   [MAR Hold] sodium chloride  flush (NS) 0.9 % injection 3 mL  3 mL Intravenous Q12H Segars, Jonathan, MD   3 mL at 11/27/23 1110    Medications Prior to Admission  Medication Sig Dispense Refill Last Dose/Taking   carvedilol (COREG) 12.5 MG tablet Take 1 tablet (12.5 mg total) by mouth 2 (two) times daily. 180 tablet 3 11/25/2023   insulin glargine (LANTUS) 100 UNIT/ML injection Inject 15 Units into the skin daily.   Past Week   losartan (COZAAR) 25 MG tablet Take 1 tablet (25 mg  total) by mouth daily. 90 tablet 3 11/25/2023   methocarbamol  (ROBAXIN ) 500 MG tablet Take 500 mg by mouth 3 (three) times daily.   11/26/2023   OZEMPIC, 1 MG/DOSE, 4 MG/3ML SOPN Inject 1 mg into the skin once a week. Thursdays   11/16/2023   rosuvastatin (CRESTOR) 10 MG tablet Take 1 tablet (10 mg total) by mouth daily. 90 tablet 3 11/25/2023    Family History  Problem Relation Age of Onset   Diabetes Mother    CAD Mother    High blood pressure Maternal Grandmother    Diabetes Paternal Grandmother      Review of Systems:   Review of Systems  Constitutional:  Positive for malaise/fatigue.  Respiratory:  Positive for shortness of breath.   Cardiovascular:  Positive for chest pain, claudication and leg swelling.  Gastrointestinal:  Positive for nausea.  Musculoskeletal:  Positive for back pain and myalgias.  Neurological:  Positive for tingling.       Physical Exam: BP (!) 134/90   Pulse 93   Temp 99 F (37.2 C) (Oral)   Resp (!) 26   Ht 5' 2 (1.575 m)   Wt 109.4 kg   SpO2 94%   BMI 44.11 kg/m    General appearance: alert, cooperative, and no distress Head: Normocephalic, without obvious abnormality, atraumatic Neck: no adenopathy, no carotid bruit, no JVD, supple, symmetrical, trachea midline,  and thyroid not enlarged, symmetric, no tenderness/mass/nodules Lymph nodes: Cervical, supraclavicular, and axillary nodes normal. Resp: clear to auscultation bilaterally Back: symmetric, no curvature. ROM normal. No CVA tenderness. Cardio: regular rate and rhythm, S1, S2 normal, no murmur, click, rub or gallop GI: soft, non-tender; bowel sounds normal; no masses,  no organomegaly and obese Extremities: Minor foot and ankle edema, palpable DP bilaterally, absent PT bilaterally Neurologic: Grossly normal  Diagnostic Studies & Laboratory data:     Recent Radiology Findings:   CARDIAC CATHETERIZATION Result Date: 11/27/2023   Prox RCA to Mid RCA lesion is 20% stenosed.    Prox LAD lesion is 90% stenosed.   1st Diag lesion is 70% stenosed. Severe stenosis proximal LAD involving the large caliber diagonal branch. The Diagonal branch has at least moderate ostial stenosis. No obstructive disease in the circumflex Large dominant RCA with mild non-obstructive disease. Elevated LVEDP. Recommendations: Her severe stenosis is in the proximal LAD and involves a large caliber diagonal branch. PCI of the LAD would compromise flow into the Diagonal branch. Bifurcation stenting of the LAD/Diagonal would leave her with two layers of stent in the proximal LAD. I think the best option for revascularization is bypass of the LAD/Diagonal (LIMA and possible radial artery graft to the Diagonal). Will continue ASA and statin. Consult to CT surgery for bypass. Will diurese with IV Lasix given elevated LV filling pressure.   CT Angio Chest/Abd/Pel for Dissection W and/or Wo Contrast Result Date: 11/26/2023 EXAM: CTA CHEST, ABDOMEN AND PELVIS WITH AND WITHOUT CONTRAST 11/26/2023 09:33:54 AM TECHNIQUE: CTA of the chest was performed with and without the administration of 100 mL of intravenous contrast (iohexol (OMNIPAQUE) 350 MG/ML injection 100 mL IOHEXOL 350 MG/ML SOLN). CTA of the abdomen and pelvis was performed with and without the administration of 100 mL of intravenous contrast (iohexol (OMNIPAQUE) 350 MG/ML injection 100 mL IOHEXOL 350 MG/ML SOLN). Multiplanar reformatted images are provided for review. MIP images are provided for review. Automated exposure control, iterative reconstruction, and/or weight based adjustment of the mA/kV was utilized to reduce the radiation dose to as low as reasonably achievable. COMPARISON: None available. CLINICAL HISTORY: Acute aortic syndrome (AAS) suspected. Pt complains of tearing chest pain that radiates to the back. No infectious symptoms. FINDINGS: VASCULATURE: AORTA: Noncontrast CT imaging demonstrates no intramural hematoma within the thoracic aorta.  Contrast enhanced series demonstrates no aortic dissection or aneurysm. No abdominal aortic aneurysm. PULMONARY ARTERIES: No pulmonary embolism within the limits of this exam. GREAT VESSELS OF AORTIC ARCH: Great vessels are normal. No dissection. No arterial occlusion or significant stenosis. CELIAC TRUNK: No acute finding. No occlusion or significant stenosis. SUPERIOR MESENTERIC ARTERY: No acute finding. No occlusion or significant stenosis. INFERIOR MESENTERIC ARTERY: No acute finding. No occlusion or significant stenosis. RENAL ARTERIES: No acute finding. No occlusion or significant stenosis. ILIAC ARTERIES: No acute finding. No occlusion or significant stenosis. CHEST: MEDIASTINUM: No mediastinal lymphadenopathy. The heart and pericardium demonstrate no acute abnormality. No pericardial fluid. No mediastinal hematoma. LUNGS AND PLEURA: The lungs are without acute process. No focal consolidation or pulmonary edema. No pulmonary infarction. No pneumonia. No pleural fluid. No pneumothorax. Incidental finding of azygos lobe in the right lung. THORACIC BONES AND SOFT TISSUES: No acute bone or soft tissue abnormality. ABDOMEN AND PELVIS: LIVER: The liver is unremarkable. GALLBLADDER AND BILE DUCTS: Gallbladder is unremarkable. No biliary ductal dilatation. SPLEEN: The spleen is unremarkable. PANCREAS: The pancreas is unremarkable. ADRENAL GLANDS: Bilateral adrenal glands demonstrate no acute abnormality. KIDNEYS, URETERS  AND BLADDER: No stones in the kidneys or ureters. No hydronephrosis. No perinephric or periureteral stranding. Urinary bladder is unremarkable. GI AND BOWEL: Stomach and duodenal sweep demonstrate no acute abnormality. There is no bowel obstruction. No abnormal bowel wall thickening or distension. REPRODUCTIVE: Within the pelvis there is a rounded 5.3 cm mass superior to the fundus of the uterus. Findings most consistent with exophytic leiomyoma. PERITONEUM AND RETROPERITONEUM: No ascites or free  air. LYMPH NODES: No lymphadenopathy. ABDOMINAL BONES AND SOFT TISSUES: Posterior lumbar fusion and posterior lumbar decompression. No acute soft tissue abnormality. IMPRESSION: 1. No evidence of acute aortic syndrome, including dissection or intramural hematoma. 2. 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 3. Posterior lumbar fusion without complication. Electronically signed by: Norleen Boxer MD 11/26/2023 10:10 AM EDT RP Workstation: [id number]   DG Chest 2 View Result Date: 11/26/2023 EXAM: 2 VIEW(S) XRAY OF THE CHEST 11/26/2023 05:02:01 AM COMPARISON: None available. CLINICAL HISTORY: CP. 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 now CP midsternum with pressure that radiates to the back. FINDINGS: LUNGS AND PLEURA: No focal pulmonary opacity. No pulmonary edema. No pleural effusion. No pneumothorax. HEART AND MEDIASTINUM: No acute abnormality of the cardiac and mediastinal silhouettes. BONES AND SOFT TISSUES: No acute osseous abnormality. IMPRESSION: 1. No acute cardiopulmonary process identified. Electronically signed by: Waddell Calk MD 11/26/2023 05:24 AM EDT RP Workstation: [id number]       ECHOCARDIOGRAM REPORT       Patient Name:   Jasmin Gordon [facility] Date of Exam: 11/15/2023  Medical Rec #:  [id number]     Height:       63.0 in  Accession #:    [id number]    Weight:       233.6 lb  Date of Birth:  January 05, 1971     BSA:          2.066 m  Patient Age:    53 years      BP:           170/122 mmHg  Patient Gender: F             HR:           97 bpm.  Exam Location:  [place]   Procedure: 2D Echo, Cardiac Doppler, Color Doppler and Strain Analysis  (Both            Spectral and Color Flow Doppler were utilized during  procedure).   Indications:   Precordial pain [R07.2 (ICD-10-CM)]    History:        Patient has prior history of Echocardiogram examinations,  most                 recent 10/29/2022.  Arrythmias:Tachycardia,                  Signs/Symptoms:Fatigue, Dyspnea and Chest Pain; Risk                  Factors:Hypertension, Sleep Apnea and Diabetes. Prior  echo-                 55-60% EF.    Sonographer:    Saddie Chimes  Referring Phys: [id number] SREEDHAR R MADIREDDY   IMPRESSIONS     1. Left ventricular ejection fraction, by estimation, is 60 to 65%. The  left ventricle has normal function. The left ventricle has no regional  wall motion abnormalities. There is mild left ventricular hypertrophy.  Left  ventricular diastolic parameters  are consistent with Grade I diastolic dysfunction (impaired relaxation).  The average left ventricular global longitudinal strain is 18.5 %. The  global longitudinal strain is normal.   2. Right ventricular systolic function is normal. The right ventricular  size is normal. There is normal pulmonary artery systolic pressure.   3. The mitral valve is normal in structure. No evidence of mitral valve  regurgitation. No evidence of mitral stenosis.   4. The aortic valve is normal in structure. Aortic valve regurgitation is  not visualized. No aortic stenosis is present.   5. The inferior vena cava is normal in size with greater than 50%  respiratory variability, suggesting right atrial pressure of 3 mmHg.   FINDINGS   Left Ventricle: Left ventricular ejection fraction, by estimation, is 60  to 65%. The left ventricle has normal function. The left ventricle has no  regional wall motion abnormalities. The average left ventricular global  longitudinal strain is 18.5 %.  Strain was performed and the global longitudinal strain is normal. The  left ventricular internal cavity size was normal in size. There is mild  left ventricular hypertrophy. Left ventricular diastolic parameters are  consistent with Grade I diastolic  dysfunction (impaired relaxation).   Right Ventricle: The right ventricular size is normal. No increase in  right ventricular  wall thickness. Right ventricular systolic function is  normal. There is normal pulmonary artery systolic pressure. The tricuspid  regurgitant velocity is 0.84 m/s, and   with an assumed right atrial pressure of 3 mmHg, the estimated right  ventricular systolic pressure is 5.8 mmHg.   Left Atrium: Left atrial size was normal in size.   Right Atrium: Right atrial size was normal in size.   Pericardium: There is no evidence of pericardial effusion.   Mitral Valve: The mitral valve is normal in structure. No evidence of  mitral valve regurgitation. No evidence of mitral valve stenosis. MV peak  gradient, 4.7 mmHg. The mean mitral valve gradient is 2.0 mmHg.   Tricuspid Valve: The tricuspid valve is normal in structure. Tricuspid  valve regurgitation is not demonstrated. No evidence of tricuspid  stenosis.   Aortic Valve: The aortic valve is normal in structure. Aortic valve  regurgitation is not visualized. No aortic stenosis is present. Aortic  valve mean gradient measures 4.2 mmHg. Aortic valve peak gradient measures  7.8 mmHg. Aortic valve area, by VTI  measures 2.21 cm.   Pulmonic Valve: The pulmonic valve was normal in structure. Pulmonic valve  regurgitation is not visualized. No evidence of pulmonic stenosis.   Aorta: The aortic root is normal in size and structure.   Venous: The inferior vena cava is normal in size with greater than 50%  respiratory variability, suggesting right atrial pressure of 3 mmHg.   IAS/Shunts: No atrial level shunt detected by color flow Doppler.     LEFT VENTRICLE  PLAX 2D  LVIDd:         4.60 cm   Diastology  LVIDs:         2.90 cm   LV e' medial:    6.42 cm/s  LV PW:         1.30 cm   LV E/e' medial:  11.9  LV IVS:        1.60 cm   LV e' lateral:   9.79 cm/s  LVOT diam:     1.90 cm   LV E/e' lateral: 7.8  LV SV:  53  LV SV Index:   26        2D Longitudinal Strain  LVOT Area:     2.84 cm  2D Strain GLS Avg:     18.5 %      RIGHT VENTRICLE  RV Basal diam:  3.40 cm  RV Mid diam:    2.50 cm  TAPSE (M-mode): 2.3 cm   LEFT ATRIUM           Index  LA diam:      3.20 cm 1.55 cm/m  LA Vol (A4C): 40.9 ml 19.80 ml/m   AORTIC VALVE                    PULMONIC VALVE  AV Area (Vmax):    2.09 cm     PV Vmax:       1.10 m/s  AV Area (Vmean):   2.13 cm     PV Vmean:      71.200 cm/s  AV Area (VTI):     2.21 cm     PV VTI:        0.220 m  AV Vmax:           140.00 cm/s  PV Peak grad:  4.8 mmHg  AV Vmean:          96.250 cm/s  PV Mean grad:  2.0 mmHg  AV VTI:            0.242 m  AV Peak Grad:      7.8 mmHg  AV Mean Grad:      4.2 mmHg  LVOT Vmax:         103.00 cm/s  LVOT Vmean:        72.333 cm/s  LVOT VTI:          0.188 m  LVOT/AV VTI ratio: 0.78    AORTA  Ao Asc diam: 3.80 cm   MITRAL VALVE                TRICUSPID VALVE  MV Area (PHT): 4.91 cm     TR Peak grad:   2.8 mmHg  MV Area VTI:   2.47 cm     TR Vmax:        84.10 cm/s  MV Peak grad:  4.7 mmHg  MV Mean grad:  2.0 mmHg     SHUNTS  MV Vmax:       1.08 m/s     Systemic VTI:  0.19 m  MV Vmean:      56.4 cm/s    Systemic Diam: 1.90 cm  MV Decel Time: 155 msec  MV E velocity: 76.17 cm/s  MV A velocity: 103.00 cm/s  MV E/A ratio:  0.74   Lamar Fitch MD  Electronically signed by Lamar Fitch MD  Signature Date/Time: 11/15/2023/5:01:26 PM       I have independently reviewed the above radiologic studies and discussed with the patient   Recent Lab Findings: Lab Results  Component Value Date   WBC 11.3 (H) 11/27/2023   HGB 12.7 11/27/2023   HCT 37.9 11/27/2023   PLT 236 11/27/2023   GLUCOSE 213 (H) 11/27/2023   CHOL 153 11/27/2023   TRIG 121 11/27/2023   HDL 31 (L) 11/27/2023   LDLCALC 98 11/27/2023   ALT 30 06/05/2012   AST 24 06/05/2012   NA 137 11/27/2023   K 3.6 11/27/2023   CL 105 11/27/2023   CREATININE 0.52 11/27/2023   BUN 10  11/27/2023   CO2 22 11/27/2023   TSH 3.790 06/05/2012   HGBA1C 7.5 (H) 11/27/2023       Assessment / Plan: Severe LAD-diagonal coronary artery disease in the setting of unstable angina. Hypertension History of radiculopathy and lumbar back surgery x 2 Sleep apnea GERD DM2 Obesity Hyperlipidemia  Plan: The patient appears to be an adequate candidate for CABG.  She will be seen by the surgeon to make final recommendations.  Tentatively, the patient's procedure is scheduled for Wednesday of this week.  I  spent 30 minutes counseling the patient face to face.   Lemond FORBES Cera, PA-C  11/27/2023 3:02 PM   Patient seen and examined, records and cath images reviewed.  53 yo woman with multiple cardiac risk factors and accelerating angina.  Severe CAD involving proximal LAD and ostium of large 1st diagonal.  CABG indicated for relief of symptoms.  I discussed the general nature of the procedure, including the need for general anesthesia, the incisions to be used, the use of cardiopulmonary bypass, and the use of temporary pacemaker wires and drainage tubes postoperatively with Ms. Hufstetler.  We discussed the expected hospital stay, overall recovery and short and long term outcomes. I informed her of the indications, risks, benefits and alternatives.   She understands the risks include, but are not limited to death, stroke, MI, DVT/PE, bleeding, possible need for transfusion, infections, cardiac arrhythmias, as well as other organ system dysfunction including respiratory, renal, or GI complications.    Normal Allen's test on left- will plan to use left radial artery in addition to LIMA.  Elspeth MOTE Kerrin, MD [facility] [phone]

## 2023-11-28 ENCOUNTER — Inpatient Hospital Stay (HOSPITAL_COMMUNITY)

## 2023-11-28 ENCOUNTER — Encounter (HOSPITAL_COMMUNITY): Payer: Self-pay | Admitting: Cardiovascular Disease

## 2023-11-28 DIAGNOSIS — Z01810 Encounter for preprocedural cardiovascular examination: Secondary | ICD-10-CM | POA: Diagnosis not present

## 2023-11-28 DIAGNOSIS — R7989 Other specified abnormal findings of blood chemistry: Secondary | ICD-10-CM | POA: Diagnosis not present

## 2023-11-28 DIAGNOSIS — I200 Unstable angina: Secondary | ICD-10-CM | POA: Diagnosis not present

## 2023-11-28 LAB — URINALYSIS, ROUTINE W REFLEX MICROSCOPIC
Bilirubin Urine: NEGATIVE
Glucose, UA: NEGATIVE mg/dL
Ketones, ur: NEGATIVE mg/dL
Nitrite: NEGATIVE
Protein, ur: NEGATIVE mg/dL
Specific Gravity, Urine: 1.011 (ref 1.005–1.030)
pH: 6 (ref 5.0–8.0)

## 2023-11-28 LAB — BASIC METABOLIC PANEL WITH GFR
Anion gap: 11 (ref 5–15)
BUN: 13 mg/dL (ref 6–20)
CO2: 21 mmol/L — ABNORMAL LOW (ref 22–32)
Calcium: 9 mg/dL (ref 8.9–10.3)
Chloride: 103 mmol/L (ref 98–111)
Creatinine, Ser: 0.75 mg/dL (ref 0.44–1.00)
GFR, Estimated: >60 mL/min (ref >60)
Glucose, Bld: 207 mg/dL — ABNORMAL HIGH (ref 70–99)
Potassium: 3.6 mmol/L (ref 3.5–5.1)
Sodium: 135 mmol/L (ref 135–145)

## 2023-11-28 LAB — TYPE AND SCREEN
ABO/RH(D): A POS
Antibody Screen: NEGATIVE

## 2023-11-28 LAB — CBC
HCT: 37.3 % (ref 36.0–46.0)
Hemoglobin: 13 g/dL (ref 12.0–15.0)
MCH: 29.3 pg (ref 26.0–34.0)
MCHC: 34.9 g/dL (ref 30.0–36.0)
MCV: 84.2 fL (ref 80.0–100.0)
Platelets: 371 K/uL (ref 150–400)
RBC: 4.43 MIL/uL (ref 3.87–5.11)
RDW: 12.6 % (ref 11.5–15.5)
WBC: 12.5 K/uL — ABNORMAL HIGH (ref 4.0–10.5)
nRBC: 0 % (ref 0.0–0.2)

## 2023-11-28 LAB — BLOOD GAS, ARTERIAL
Acid-base deficit: 1.4 mmol/L (ref 0.0–2.0)
Bicarbonate: 22.8 mmol/L (ref 20.0–28.0)
Drawn by: 33176
O2 Saturation: 97.9 %
Patient temperature: 36.8
pCO2 arterial: 36 mmHg (ref 32–48)
pH, Arterial: 7.41 (ref 7.35–7.45)
pO2, Arterial: 80 mmHg — ABNORMAL LOW (ref 83–108)

## 2023-11-28 LAB — HEPARIN LEVEL (UNFRACTIONATED)
Heparin Unfractionated: <0.1 [IU]/mL — ABNORMAL LOW (ref 0.30–0.70)
Heparin Unfractionated: 0.1 [IU]/mL — ABNORMAL LOW (ref 0.30–0.70)

## 2023-11-28 LAB — APTT: aPTT: 36 s (ref 24–36)

## 2023-11-28 LAB — GLUCOSE, CAPILLARY
Glucose-Capillary: 218 mg/dL — ABNORMAL HIGH (ref 70–99)
Glucose-Capillary: 252 mg/dL — ABNORMAL HIGH (ref 70–99)
Glucose-Capillary: 167 mg/dL — ABNORMAL HIGH (ref 70–99)
Glucose-Capillary: 231 mg/dL — ABNORMAL HIGH (ref 70–99)

## 2023-11-28 LAB — SURGICAL PCR SCREEN
MRSA, PCR: NEGATIVE
Staphylococcus aureus: NEGATIVE

## 2023-11-28 LAB — PROTIME-INR
INR: 1 (ref 0.8–1.2)
Prothrombin Time: 13.9 s (ref 11.4–15.2)

## 2023-11-28 MED ORDER — DEXMEDETOMIDINE HCL IN NACL 400 MCG/100ML IV SOLN
0.1000 ug/kg/h | INTRAVENOUS | Status: AC
Start: 2023-11-29 — End: 2023-11-30
  Administered 2023-11-29: .7 ug/kg/h via INTRAVENOUS
  Filled 2023-11-28: qty 100

## 2023-11-28 MED ORDER — PAPAVERINE HCL 30 MG/ML IJ SOLN
INTRAMUSCULAR | Status: DC
  Filled 2023-11-28: qty 2.5

## 2023-11-28 MED ORDER — EPINEPHRINE HCL 5 MG/250ML IV SOLN IN NS
0.0000 ug/min | INTRAVENOUS | Status: DC
  Filled 2023-11-28: qty 250

## 2023-11-28 MED ORDER — PHENYLEPHRINE HCL-NACL 20-0.9 MG/250ML-% IV SOLN
30.0000 ug/min | INTRAVENOUS | Status: AC
  Administered 2023-11-29: 20 ug/min via INTRAVENOUS
  Filled 2023-11-28: qty 250

## 2023-11-28 MED ORDER — AMLODIPINE BESYLATE 10 MG PO TABS
10.0000 mg | ORAL_TABLET | Freq: Every day | ORAL | Status: DC
  Administered 2023-11-28: 10 mg via ORAL
  Filled 2023-11-28: qty 1

## 2023-11-28 MED ORDER — TRANEXAMIC ACID (OHS) PUMP PRIME SOLUTION
2.0000 mg/kg | INTRAVENOUS | Status: DC
Start: 2023-11-29 — End: 2023-11-30
  Filled 2023-11-28: qty 2.13

## 2023-11-28 MED ORDER — CEFAZOLIN SODIUM-DEXTROSE 2-4 GM/100ML-% IV SOLN
2.0000 g | INTRAVENOUS | Status: DC
Start: 2023-11-29 — End: 2023-11-30
  Filled 2023-11-28: qty 100

## 2023-11-28 MED ORDER — NITROGLYCERIN IN D5W 200-5 MCG/ML-% IV SOLN
2.0000 ug/min | INTRAVENOUS | Status: DC
Start: 2023-11-29 — End: 2023-11-30
  Filled 2023-11-28: qty 250

## 2023-11-28 MED ORDER — ROSUVASTATIN CALCIUM 20 MG PO TABS
40.0000 mg | ORAL_TABLET | Freq: Every day | ORAL | Status: DC
  Administered 2023-11-28 – 2023-12-03 (×5): 40 mg via ORAL
  Filled 2023-11-28 (×5): qty 2

## 2023-11-28 MED ORDER — VANCOMYCIN HCL 1.5 G IV SOLR
1500.0000 mg | INTRAVENOUS | Status: AC
  Administered 2023-11-29: 1500 mg via INTRAVENOUS
  Filled 2023-11-28: qty 30

## 2023-11-28 MED ORDER — MILRINONE LACTATE IN DEXTROSE 20-5 MG/100ML-% IV SOLN
0.3000 ug/kg/min | INTRAVENOUS | Status: DC
Start: 2023-11-29 — End: 2023-11-30
  Filled 2023-11-28: qty 100

## 2023-11-28 MED ORDER — HEPARIN 30,000 UNITS/1000 ML (OHS) CELLSAVER SOLUTION
Status: DC
Start: 2023-11-29 — End: 2023-11-30
  Filled 2023-11-28: qty 1000

## 2023-11-28 MED ORDER — MAGNESIUM SULFATE 50 % IJ SOLN
40.0000 meq | INTRAMUSCULAR | Status: DC
  Filled 2023-11-28: qty 9.85

## 2023-11-28 MED ORDER — HEPARIN BOLUS VIA INFUSION
2000.0000 [IU] | Freq: Once | INTRAVENOUS | Status: AC
  Administered 2023-11-28: 2000 [IU] via INTRAVENOUS
  Filled 2023-11-28: qty 2000

## 2023-11-28 MED ORDER — POTASSIUM CHLORIDE 2 MEQ/ML IV SOLN
80.0000 meq | INTRAVENOUS | Status: DC
  Filled 2023-11-28: qty 40

## 2023-11-28 MED ORDER — POTASSIUM CHLORIDE CRYS ER 20 MEQ PO TBCR
20.0000 meq | EXTENDED_RELEASE_TABLET | Freq: Once | ORAL | Status: AC
  Administered 2023-11-28: 20 meq via ORAL
  Filled 2023-11-28: qty 1

## 2023-11-28 MED ORDER — TRANEXAMIC ACID 1000 MG/10ML IV SOLN
1.5000 mg/kg/h | INTRAVENOUS | Status: AC
Start: 2023-11-29 — End: 2023-11-30
  Administered 2023-11-29: 1.5 mg/kg/h via INTRAVENOUS
  Filled 2023-11-28: qty 25

## 2023-11-28 MED ORDER — NOREPINEPHRINE 4 MG/250ML-% IV SOLN
0.0000 ug/min | INTRAVENOUS | Status: DC
Start: 2023-11-29 — End: 2023-11-30
  Filled 2023-11-28: qty 250

## 2023-11-28 MED ORDER — TRANEXAMIC ACID (OHS) BOLUS VIA INFUSION
15.0000 mg/kg | INTRAVENOUS | Status: AC
  Administered 2023-11-29: 1599 mg via INTRAVENOUS
  Filled 2023-11-28: qty 1599

## 2023-11-28 MED ORDER — INSULIN REGULAR(HUMAN) IN NACL 100-0.9 UT/100ML-% IV SOLN
INTRAVENOUS | Status: AC
Start: 2023-11-29 — End: 2023-11-30
  Administered 2023-11-29: 7 [IU]/h via INTRAVENOUS
  Filled 2023-11-28: qty 100

## 2023-11-28 MED ORDER — CEFAZOLIN SODIUM-DEXTROSE 2-4 GM/100ML-% IV SOLN
2.0000 g | INTRAVENOUS | Status: AC
  Administered 2023-11-29 (×2): 2 g via INTRAVENOUS
  Filled 2023-11-28: qty 100

## 2023-11-28 NOTE — Progress Notes (Addendum)
 Progress Note  Patient Name: Jasmin Gordon Date of Encounter: 11/28/2023 [facility] Cardiologist: Alean SAUNDERS Madireddy, MD   Interval Summary    Intermittent episodes of chest discomfort, other doing ok.   Vital Signs Vitals:   11/28/23 0333 11/28/23 0615 11/28/23 0736 11/28/23 0900  BP: (!) 137/96  (!) 150/96 (!) 169/115  Pulse: 98 97    Resp: 20  18 20   Temp: 98.8 F (37.1 C)  98.2 F (36.8 C) 98.4 F (36.9 C)  TempSrc: Oral  Oral Oral  SpO2: 97% 99%    Weight:  106.6 kg    Height:        Intake/Output Summary (Last 24 hours) at 11/28/2023 0949 Last data filed at 11/27/2023 2004 Gross per 24 hour  Intake 787.89 ml  Output --  Net 787.89 ml      11/28/2023    6:15 AM 11/27/2023   12:05 AM 11/26/2023    4:39 AM  Last 3 Weights  Weight (lbs) 235 lb 241 lb 2.9 oz 241 lb 3.2 oz  Weight (kg) 106.595 kg 109.4 kg 109.408 kg      Telemetry/ECG  Sinus Rhythm, 80-90s - Personally Reviewed  Physical Exam  GEN: No acute distress.   Neck: No JVD Cardiac: RRR, no murmurs, rubs, or gallops.  Respiratory: Clear to auscultation bilaterally. GI: Soft, nontender, non-distended  MS: No edema VAS: right radial cath site stable  Assessment & Plan   53 y.o. female with a history of CAD with severe LAD disease noted on recent coronary CTA on 11/21/2023, hypertension, hyperlipidemia, GERD, obstructive sleep apnea, and obesity who was admitted on 11/26/2023 for unstable angina after presenting with chest pain.   Unstable Angina CAD -- Patient had recent coronary CTA last week that showed severe stenosis (70-99%) of proximal LAD. FFR was positive. Outpatient cardiac catheterization was arranged for 11/29/2023. However, she presented to the ED with constant chest pain since the evening of 10/24. EKG showed T wave inversions in aVL and biphasic T waves in leads V2 with appear worse. High-sensitivity 8 >> 7 >> 9 >> 30 >> 11.  -- underwent cardiac cath 10/27 severe  stenosis of pLAD and large caliber diag branch. It is felt that PCI would compromise flow into the diag and potentially need 2 layers of stent. Recommendations for CT surgery evaluation, planned for tomorrow  -- Continue IV Heparin , ASA, Amlodipine  10mg  daily, Coreg 12.5mg  twice daily, Crestor 40mg  daily   Hypertension BP initially as high as 181/108, much improved  -- Continue Coreg 12.5mg  twice daily, increase Amlodipine  to 10mg  daily   Hyperlipidemia -- Lipid panel this admission: Total Cholesterol 153, Triglycerides 121, HDL 31, LDL 98. LDL goal <70. -- increase crestor to 40mg  daily  -- Will need repeat lipid panel and LFTs in 6-8 weeks.   Type 2 Diabetes Mellitus -- Hemoglobin A1c 7.5% this admission -- Management per primary team  For questions or updates, please contact [facility] Please consult [email or web address] for contact info under     Signed, Manuelita Rummer, NP   Jasmin Gordon was seen by me today along with Manuelita Rummer, NP. I have personally performed an evaluation on this patient.  My findings are as follows: 53 y.o. female with history of CAD, HTN, HLD, sleep apnea, GERD with unstable angina. Cardiac cath 11/27/23 with severe proximal LAD stenosis involving the large caliber Diagonal branch which also has as least moderate ostial stenosis.  Planning for CABG Chest pain on and off.  Data: EKG(s) and pertinent labs, studies, etc were personally reviewed and interpreted by me:  No am EKG Tele:sinus Labs reviewed by me Otherwise, I agree with data as outlined by the advanced practice provider.  Exam performed by me: Gen: NAD Neck: No JVD Cardiac: RRR Lungs: clear lungs Extremities: no LE edema  My Assessment and Plan:  CAD with unstable angina: Given severe disease in the proximal LAD and large caliber Diagonal branch, favor CABG for revascularization. Appreciate input of the CT surgery team. Tentative CABG tomorrow. Continue IV heparin , ASA,  Norvasc , Coreg and Crestor.  HTN: BP controlled.  HLD: Continue statin  Signed,  Lonni Cash, MD  11/28/2023 10:50 AM

## 2023-11-28 NOTE — Progress Notes (Addendum)
 [facility]  PROGRESS NOTE  Jasmin Gordon FMW:[id number] DOB: April 03, 1970 DOA: 11/26/2023 PCP: Jasmin Nest, MD   Brief HPI:    53 y.o. female with hx of CAD, recent onset of anginal symptoms ~ 1 month ago, with recent coronary CT demonstrating a hemodynamically significant proximal LAD lesion plan for upcoming cardiac catheterization outpatient, additional history including morbid obesity, diabetes type 2, hypertension, hyperlipidemia, OSA, GERD, who presented with worsening chest pain.  Reports that she is typically had exertional or nighttime symptoms.  However over the past day symptoms have become significantly worse.  Had severe episodes of chest pressure radiating to her left back left arm and jaw since yesterday evening around 11 PM.  She had 7 discrete severe episodes for which she sought ED care.  Upon returning home she had another severe episode around 3:45 PM which was 20 out of 10 in severity per her report.  Symptoms lasted for about 15 minutes, the severe symptoms had resolved prior to receiving nitroglycerin.  While ambulating around the ER she had a flare of her chest pain.  No associated shortness of breath, diaphoresis.  Does have some associated nausea.  Is taking aspirin daily prior to admission.     Assessment/Plan:    Chest pain/unstable angina - 1 month history of exertional anginal symptoms and recent outpatient-coronary CTA -Coronary CTA showed severe proximal LAD stenosis, she was supposed to get cardiac cath as outpatient on 10/29 - Now presented with chest pressure, EKG showed ST depression and T wave inversions laterally, biphasic T waves anteriorly and poor R wave progression - Started on heparin  GTT, aspirin, Nitropaste every 6 hours, rosuvastatin, Coreg - Cardiology was consulted, underwent cardiac catheterization on 10/27 which showed severe stenosis of proximal LAD and large caliber diagonal branch.  It was felt PCI would compromise flow into diagonal  and potentially meet 2 layers of stent.  CT surgery was consulted for CABG evaluation. 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 -Follow-up OB/GYN as outpatient  Morbid obesity:  -On GLP1 outpatient  Diabetes type 2:  - Continue Lantus 8 units subcu daily - Continue sliding scale insulin with NovoLog, very sensitive  Hypertension:  -Continue carvedilol and amlodipine  as above  Hyperlipidemia:  - Continue rosuvastatin  OSA:  -CPAP   GERD:  -Famotidine prn       DVT prophylaxis: Heparin    Medications     amLODipine   5 mg Oral Daily   aspirin  81 mg Oral Daily   carvedilol  12.5 mg Oral BID   free water  500 mL Oral Once   insulin aspart  0-6 Units Subcutaneous TID WC   insulin glargine-yfgn  8 Units Subcutaneous Daily   mupirocin ointment  1 Application Nasal BID   rosuvastatin  20 mg Oral Daily   sodium chloride  flush  10-40 mL Intracatheter Q12H   sodium chloride  flush  3 mL Intravenous Q12H   sodium chloride  flush  3 mL Intravenous Q12H     Data Reviewed:   CBG:  Recent Labs  Lab 11/27/23 0800 11/27/23 1102 11/27/23 1644 11/27/23 2058 11/28/23 0737  GLUCAP 190* 122* 100* 198* 218*    SpO2: 99 %    Vitals:   11/28/23 0019 11/28/23 0333 11/28/23 0615 11/28/23 0736  BP: 128/76 ROLLEN)  137/96  (!) 150/96  Pulse: 97 98 97   Resp: 20 20  18   Temp: 98.4 F (36.9 C) 98.8 F (37.1 C)  98.2 F (36.8 C)  TempSrc: Oral Oral  Oral  SpO2: 100% 97% 99%   Weight:   106.6 kg   Height:          Data Reviewed:  Basic Metabolic Panel: Recent Labs  Lab 11/21/23 1004 11/23/23 1202 11/26/23 0452 11/27/23 0356 11/28/23 0555  NA  --  137 134* 137 135  K  --  4.1 4.0 3.6 3.6  CL  --  101 102 105 103  CO2  --  22 20* 22 21*  GLUCOSE  --  126*  231* 213* 207*  BUN  --  14 14 10 13   CREATININE 0.60 0.56* 0.65 0.52 0.75  CALCIUM  --  9.1 8.8* 8.7* 9.0  MG  --   --   --  1.8  --   PHOS  --   --   --  3.7  --     CBC: Recent Labs  Lab 11/23/23 1202 11/26/23 0452 11/27/23 0356 11/28/23 0555  WBC 12.0* 16.4* 11.3* 12.5*  HGB 14.1 13.6 12.7 13.0  HCT 43.7 39.7 37.9 37.3  MCV 90 86.3 87.7 84.2  PLT 374 349 236 371    LFT No results for input(s): AST, ALT, ALKPHOS, BILITOT, PROT, ALBUMIN in the last 168 hours.   Antibiotics: Anti-infectives (From admission, onward)    None        CONSULTS cardiology  Code Status: Full code  Family Communication: Discussed with patient's daughter at bedside     Subjective   Patient seen and examined.  Denies chest pain   Objective    Physical Examination:   General-appears in no acute distress Heart-S1-S2, regular, no murmur auscultated Lungs-clear to auscultation bilaterally, no wheezing or crackles auscultated Abdomen-soft, nontender, no organomegaly Extremities-no edema in the lower extremities Neuro-alert, oriented x3, no focal deficit noted  Status is: Inpatient:             Jasmin Gordon   [facility] If 7PM-7AM, please contact night-coverage at [email or web address], Office  [phone]   11/28/2023, 9:13 AM  LOS: 2 days

## 2023-11-28 NOTE — Inpatient Diabetes Management (Signed)
 Inpatient Diabetes Program Recommendations  AACE/ADA: New Consensus Statement on Inpatient Glycemic Control   Target Ranges:  Prepandial:   less than 140 mg/dL      Peak postprandial:   less than 180 mg/dL (1-2 hours)      Critically ill patients:  140 - 180 mg/dL    Latest Reference Range & Units 11/27/23 08:00 11/27/23 11:02 11/27/23 16:44 11/27/23 20:58 11/28/23 07:37 11/28/23 11:59  Glucose-Capillary 70 - 99 mg/dL 809 (H) 877 (H) 899 (H) 198 (H) 218 (H) 252 (H)    Review of Glycemic Control  Diabetes history: DM2 Outpatient Diabetes medications: Lantus 15 units daily, Ozempic 1 mg Qweek (Thursdays) Current orders for Inpatient glycemic control: Semglee 8 units daily, Novolog 0-6 units TID with meals  Inpatient Diabetes Program Recommendations:    Insulin: Please consider increasing Semglee to 12 units daily and add Novolog 0-5 units at bedtime for bedtime correction.  Thanks, Earnie Gainer, RN, MSN, CDCES Diabetes Coordinator Inpatient Diabetes Program [phone] (Team Pager from 8am to 5pm)

## 2023-11-28 NOTE — Progress Notes (Signed)
 CARDIAC REHAB PHASE I      Pre-op OHS education including OHS booklet, OHS handout, IS use, mobility importance, home needs at discharge and sternal precautions/move in the tube reviewed. All questions and concerns addressed. Will continue to follow.   Fairy JONETTA Music, RN BSN 11/28/2023 1:39 PM [phone]

## 2023-11-28 NOTE — Progress Notes (Signed)
 ANTICOAGULATION CONSULT NOTE  Pharmacy Consult for Heparin  Indication: chest pain/ACS  Allergies  Allergen Reactions   Bee Venom Anaphylaxis    Patient Measurements: Height: 5' 2 (157.5 cm) Weight: 106.6 kg (235 lb) IBW/kg (Calculated) : 50.1 Heparin  Dosing Weight: 76.7 kg  Vital Signs: Temp: 98.1 F (36.7 C) (10/28 1603) Temp Source: Oral (10/28 1603) BP: 148/94 (10/28 1410) Pulse Rate: 87 (10/28 1158)  Labs: Recent Labs    11/26/23 0452 11/26/23 0452 11/26/23 0752 11/26/23 1650 11/26/23 1850 11/27/23 0026 11/27/23 0356 11/27/23 1103 11/28/23 0555 11/28/23 1424 11/28/23 1619  HGB 13.6  --   --   --   --   --  12.7  --  13.0  --   --   HCT 39.7  --   --   --   --   --  37.9  --  37.3  --   --   PLT 349  --   --   --   --   --  236  --  371  --   --   APTT  --   --   --   --   --   --   --   --   --  36  --   LABPROT  --   --   --   --   --   --   --   --   --  13.9  --   INR  --   --   --   --   --   --   --   --   --  1.0  --   HEPARINUNFRC  --    < >  --   --   --   --  <0.10* <0.10* <0.10*  --  0.10*  CREATININE 0.65  --   --   --   --   --  0.52  --  0.75  --   --   TROPONINIHS 8  --    < > 9 30* 11  --   --   --   --   --    < > = values in this interval not displayed.    Estimated Creatinine Clearance: 94.4 mL/min (by C-G formula based on SCr of 0.75 mg/dL).  Assessment: 68 yof with a history of CAD. Patient is presenting with chest pain. Heparin  per pharmacy consult placed for chest pain/ACS. Patient is not on anticoagulation prior to arrival.  -heparin  level= 0.1 on heparin  1600 units/hr -plans noted for CABG in am  Goal of Therapy:  Heparin  level 0.3-0.7 units/ml Monitor platelets by anticoagulation protocol: Yes   Plan:  -no heparin  bolus as heparin  will be off for surgery on 10/29 -Increase heparin  to 1800 units/hr -Heparin  level in the morning if not stopped by then  Prentice Poisson, PharmD Clinical Pharmacist **Pharmacist phone directory  can now be found on [email or web address] ([email or web address]).  Listed under [facility].

## 2023-11-28 NOTE — TOC Initial Note (Signed)
 Transition of Care [facility]) - Initial/Assessment Note    Patient Details  Name: Jasmin Gordon MRN: [id number] Date of Birth: April 17, 1970  [facility]) CM/SW Contact:    Sudie Erminio Deems, RN Phone Number: 11/28/2023, 3:37 PM  Clinical Narrative: Patient presented for unstable angina-plan for CABG 11-29-23. PTA patient was independent from home with daughters Dorian and Margeret that will provide support post hospitalization. Patient states she has a PCP and has no issues getting to appointments. Inpatient Case Manager will continue to follow for additional disposition needs as the patient progresses.                  Expected Discharge Plan: Home/Self Care Barriers to Discharge: Continued Medical Work up   Patient Goals and CMS Choice Patient states their goals for this hospitalization and ongoing recovery are:: Plan to return home once stable    Expected Discharge Plan and Services In-house Referral: NA Discharge Planning Services: CM Consult Post Acute Care Choice: NA Living arrangements for the past 2 months: Single Family Home                   DME Agency: NA       HH Arranged: NA  Prior Living Arrangements/Services Living arrangements for the past 2 months: Single Family Home Lives with:: Adult Children Patient language and need for interpreter reviewed:: Yes Do you feel safe going back to the place where you live?: Yes      Need for Family Participation in Patient Care: Yes (Comment) Care giver support system in place?: Yes (comment)   Criminal Activity/Legal Involvement Pertinent to Current Situation/Hospitalization: No - Comment as needed  Activities of Daily Living   ADL Screening (condition at time of admission) Independently performs ADLs?: Yes (appropriate for developmental age) Is the patient deaf or have difficulty hearing?: No Does the patient have difficulty seeing, even when wearing glasses/contacts?: No Does the patient have difficulty  concentrating, remembering, or making decisions?: No  Permission Sought/Granted Permission sought to share information with : Family Supports, Magazine Features Editor, Case Manager                Emotional Assessment Appearance:: Appears stated age Attitude/Demeanor/Rapport: Engaged Affect (typically observed): Appropriate Orientation: : Oriented to Self, Oriented to Place, Oriented to  Time, Oriented to Situation Alcohol / Substance Use: Not Applicable Psych Involvement: No (comment)  Admission diagnosis:  Precordial pain [R07.2] Unstable angina (HCC) [I20.0] Elevated troponin [R79.89] Patient Active Problem List   Diagnosis Date Noted   Unstable angina (HCC) 11/26/2023   Hyperlipidemia 11/23/2023   CAD (coronary artery disease), abnormal cardiac CT 11-21-2023 11/22/2023   Preop cardiovascular exam 11/13/2023   Atypical chest pain    BMI 40.0-44.9, adult (HCC)    Diabetic gastroparesis associated with type 2 diabetes mellitus (HCC)    Morbid obesity (HCC)    Sinus tachycardia by electrocardiogram    Type 2 diabetes mellitus with hyperlipidemia (HCC)    Sleep apnea 08/11/2017   GERD (gastroesophageal reflux disease) 08/11/2017   Diabetes mellitus (HCC) 08/11/2017   Obese 08/11/2017   Malaise and fatigue 08/11/2017   Hoarseness 08/11/2017   Oliguria 08/11/2017   Paresthesia 08/11/2017   Numbness of foot 08/11/2017   Left flank pain 08/11/2017   Chest pain 08/11/2017   Dyspnea on exertion 08/11/2017   Cough 08/11/2017   Acute cystitis 08/11/2017   Sciatica 08/11/2017   Anxiety and depression 08/11/2017   Constipation 08/14/2011   HTN (hypertension) 08/09/2011  Lower urinary tract infectious disease 08/08/2011   Radiculopathy, lumbosacral region 08/08/2011   Intractable low back pain 08/07/2011   PCP:  [name], MD Pharmacy:   [facility] [phone] [place], [place] - [address] AT [facility] [address] [place] [address] Phone: [phone] Fax: [phone]     Social Drivers of Health (SDOH) Social History: SDOH Screenings   Food Insecurity: No Food Insecurity (11/27/2023)  Housing: Low Risk  (11/27/2023)  Transportation Needs: No Transportation Needs (11/27/2023)  Utilities: Not At Risk (11/27/2023)  Financial Resource Strain: Low Risk  (10/21/2023)   Received from [facility]  Physical Activity: Insufficiently Active (10/21/2023)   Received from [facility]  Social Connections: Socially Integrated (10/21/2023)   Received from [facility]  Stress: No Stress Concern Present (10/21/2023)   Received from [facility]  Tobacco Use: Low Risk  (11/26/2023)   SDOH Interventions:     Readmission Risk Interventions     No data to display

## 2023-11-28 NOTE — Progress Notes (Signed)
 ANTICOAGULATION CONSULT NOTE  Pharmacy Consult for Heparin  Indication: chest pain/ACS  Allergies  Allergen Reactions   Bee Venom Anaphylaxis    Patient Measurements: Height: 5' 2 (157.5 cm) Weight: 106.6 kg (235 lb) IBW/kg (Calculated) : 50.1 Heparin  Dosing Weight: 76.7 kg  Vital Signs: Temp: 98.8 F (37.1 C) (10/28 0333) Temp Source: Oral (10/28 0333) BP: 137/96 (10/28 0333) Pulse Rate: 97 (10/28 0615)  Labs: Recent Labs    11/26/23 0452 11/26/23 0752 11/26/23 1650 11/26/23 1850 11/27/23 0026 11/27/23 0356 11/27/23 1103 11/28/23 0555  HGB 13.6  --   --   --   --  12.7  --  13.0  HCT 39.7  --   --   --   --  37.9  --  37.3  PLT 349  --   --   --   --  236  --  371  HEPARINUNFRC  --   --   --   --   --  <0.10* <0.10* <0.10*  CREATININE 0.65  --   --   --   --  0.52  --   --   TROPONINIHS 8   < > 9 30* 11  --   --   --    < > = values in this interval not displayed.    Estimated Creatinine Clearance: 94.4 mL/min (by C-G formula based on SCr of 0.52 mg/dL).  Assessment: 23 yof with a history of CAD. Patient is presenting with chest pain. Heparin  per pharmacy consult placed for chest pain/ACS. Patient is not on anticoagulation prior to arrival.  AM: heparin  level undetectable on 1400 units/hr. Per RN, no issues with heparin  running continuously vafter line moved and level re-timed. CBC shows Hgb 13s, plts 371  Goal of Therapy:  Heparin  level 0.3-0.7 units/ml Monitor platelets by anticoagulation protocol: Yes   Plan:  IV heparin  2000 unit bolus  Start heparin  infusion at 1600 units/hr Check anti-Xa level in 6 hours and daily while on heparin  Continue to monitor H&H and platelets F/u plans for CABG  Lynwood Poplar, PharmD, BCPS Clinical Pharmacist 11/28/2023 6:28 AM

## 2023-11-29 ENCOUNTER — Inpatient Hospital Stay (HOSPITAL_COMMUNITY): Admitting: Certified Registered Nurse Anesthetist

## 2023-11-29 ENCOUNTER — Encounter (HOSPITAL_COMMUNITY): Admission: RE | Payer: Self-pay | Source: Home / Self Care

## 2023-11-29 ENCOUNTER — Ambulatory Visit (HOSPITAL_COMMUNITY): Admission: RE | Admit: 2023-11-29 | Source: Home / Self Care | Admitting: Cardiology

## 2023-11-29 ENCOUNTER — Inpatient Hospital Stay (HOSPITAL_COMMUNITY)

## 2023-11-29 ENCOUNTER — Inpatient Hospital Stay (HOSPITAL_COMMUNITY)
Admission: EM | Disposition: A | Discharge status: Home / Self Care | Payer: Self-pay | Source: Home / Self Care | Attending: Thoracic Surgery (Cardiothoracic Vascular Surgery)

## 2023-11-29 DIAGNOSIS — I10 Essential (primary) hypertension: Secondary | ICD-10-CM | POA: Diagnosis not present

## 2023-11-29 DIAGNOSIS — Z951 Presence of aortocoronary bypass graft: Secondary | ICD-10-CM

## 2023-11-29 DIAGNOSIS — I2510 Atherosclerotic heart disease of native coronary artery without angina pectoris: Secondary | ICD-10-CM

## 2023-11-29 DIAGNOSIS — E119 Type 2 diabetes mellitus without complications: Secondary | ICD-10-CM | POA: Diagnosis not present

## 2023-11-29 DIAGNOSIS — Z794 Long term (current) use of insulin: Secondary | ICD-10-CM

## 2023-11-29 HISTORY — PX: CORONARY ARTERY BYPASS GRAFT: SHX141

## 2023-11-29 HISTORY — PX: INTRAOPERATIVE TRANSESOPHAGEAL ECHOCARDIOGRAM: SHX5062

## 2023-11-29 HISTORY — PX: RADIAL ARTERY HARVEST: SHX5067

## 2023-11-29 LAB — POCT I-STAT 7, (LYTES, BLD GAS, ICA,H+H)
Acid-base deficit: 3 mmol/L — ABNORMAL HIGH (ref 0.0–2.0)
Acid-base deficit: 2 mmol/L (ref 0.0–2.0)
Acid-base deficit: 4 mmol/L — ABNORMAL HIGH (ref 0.0–2.0)
Acid-base deficit: 4 mmol/L — ABNORMAL HIGH (ref 0.0–2.0)
Acid-base deficit: 1 mmol/L (ref 0.0–2.0)
Acid-base deficit: 1 mmol/L (ref 0.0–2.0)
Bicarbonate: 22.8 mmol/L (ref 20.0–28.0)
Bicarbonate: 22.7 mmol/L (ref 20.0–28.0)
Bicarbonate: 22.4 mmol/L (ref 20.0–28.0)
Bicarbonate: 19.6 mmol/L — ABNORMAL LOW (ref 20.0–28.0)
Bicarbonate: 23 mmol/L (ref 20.0–28.0)
Bicarbonate: 23.4 mmol/L (ref 20.0–28.0)
Calcium, Ion: 1.05 mmol/L — ABNORMAL LOW (ref 1.15–1.40)
Calcium, Ion: 1.12 mmol/L — ABNORMAL LOW (ref 1.15–1.40)
Calcium, Ion: 1.14 mmol/L — ABNORMAL LOW (ref 1.15–1.40)
Calcium, Ion: 1.22 mmol/L (ref 1.15–1.40)
Calcium, Ion: 1.18 mmol/L (ref 1.15–1.40)
Calcium, Ion: 1.2 mmol/L (ref 1.15–1.40)
HCT: 27 % — ABNORMAL LOW (ref 36.0–46.0)
HCT: 26 % — ABNORMAL LOW (ref 36.0–46.0)
HCT: 31 % — ABNORMAL LOW (ref 36.0–46.0)
HCT: 34 % — ABNORMAL LOW (ref 36.0–46.0)
HCT: 37 % (ref 36.0–46.0)
HCT: 35 % — ABNORMAL LOW (ref 36.0–46.0)
Hemoglobin: 9.2 g/dL — ABNORMAL LOW (ref 12.0–15.0)
Hemoglobin: 8.8 g/dL — ABNORMAL LOW (ref 12.0–15.0)
Hemoglobin: 10.5 g/dL — ABNORMAL LOW (ref 12.0–15.0)
Hemoglobin: 11.6 g/dL — ABNORMAL LOW (ref 12.0–15.0)
Hemoglobin: 12.6 g/dL (ref 12.0–15.0)
Hemoglobin: 11.9 g/dL — ABNORMAL LOW (ref 12.0–15.0)
O2 Saturation: 100 %
O2 Saturation: 96 %
O2 Saturation: 96 %
O2 Saturation: 99 %
O2 Saturation: 97 %
O2 Saturation: 96 %
Patient temperature: 37.1
Patient temperature: 37.4
Patient temperature: 37.8
Patient temperature: 37.6
Potassium: 3.9 mmol/L (ref 3.5–5.1)
Potassium: 4.9 mmol/L (ref 3.5–5.1)
Potassium: 4.2 mmol/L (ref 3.5–5.1)
Potassium: 4.5 mmol/L (ref 3.5–5.1)
Potassium: 4.5 mmol/L (ref 3.5–5.1)
Potassium: 4.4 mmol/L (ref 3.5–5.1)
Sodium: 137 mmol/L (ref 135–145)
Sodium: 136 mmol/L (ref 135–145)
Sodium: 135 mmol/L (ref 135–145)
Sodium: 139 mmol/L (ref 135–145)
Sodium: 140 mmol/L (ref 135–145)
Sodium: 139 mmol/L (ref 135–145)
TCO2: 24 mmol/L (ref 22–32)
TCO2: 24 mmol/L (ref 22–32)
TCO2: 24 mmol/L (ref 22–32)
TCO2: 21 mmol/L — ABNORMAL LOW (ref 22–32)
TCO2: 24 mmol/L (ref 22–32)
TCO2: 25 mmol/L (ref 22–32)
pCO2 arterial: 42.6 mmHg (ref 32–48)
pCO2 arterial: 37.6 mmHg (ref 32–48)
pCO2 arterial: 44.2 mmHg (ref 32–48)
pCO2 arterial: 32.6 mmHg (ref 32–48)
pCO2 arterial: 37.8 mmHg (ref 32–48)
pCO2 arterial: 39.9 mmHg (ref 32–48)
pH, Arterial: 7.335 — ABNORMAL LOW (ref 7.35–7.45)
pH, Arterial: 7.389 (ref 7.35–7.45)
pH, Arterial: 7.313 — ABNORMAL LOW (ref 7.35–7.45)
pH, Arterial: 7.388 (ref 7.35–7.45)
pH, Arterial: 7.395 (ref 7.35–7.45)
pH, Arterial: 7.378 (ref 7.35–7.45)
pO2, Arterial: 264 mmHg — ABNORMAL HIGH (ref 83–108)
pO2, Arterial: 85 mmHg (ref 83–108)
pO2, Arterial: 90 mmHg (ref 83–108)
pO2, Arterial: 123 mmHg — ABNORMAL HIGH (ref 83–108)
pO2, Arterial: 94 mmHg (ref 83–108)
pO2, Arterial: 84 mmHg (ref 83–108)

## 2023-11-29 LAB — CBC
HCT: 41.2 % (ref 36.0–46.0)
HCT: 33.7 % — ABNORMAL LOW (ref 36.0–46.0)
HCT: 37.2 % (ref 36.0–46.0)
Hemoglobin: 14 g/dL (ref 12.0–15.0)
Hemoglobin: 11.4 g/dL — ABNORMAL LOW (ref 12.0–15.0)
Hemoglobin: 12.8 g/dL (ref 12.0–15.0)
MCH: 29 pg (ref 26.0–34.0)
MCH: 29.1 pg (ref 26.0–34.0)
MCH: 29.5 pg (ref 26.0–34.0)
MCHC: 34 g/dL (ref 30.0–36.0)
MCHC: 33.8 g/dL (ref 30.0–36.0)
MCHC: 34.4 g/dL (ref 30.0–36.0)
MCV: 85.5 fL (ref 80.0–100.0)
MCV: 86 fL (ref 80.0–100.0)
MCV: 85.7 fL (ref 80.0–100.0)
Platelets: 375 K/uL (ref 150–400)
Platelets: 268 K/uL (ref 150–400)
Platelets: 310 K/uL (ref 150–400)
RBC: 4.82 MIL/uL (ref 3.87–5.11)
RBC: 3.92 MIL/uL (ref 3.87–5.11)
RBC: 4.34 MIL/uL (ref 3.87–5.11)
RDW: 12.5 % (ref 11.5–15.5)
RDW: 12.5 % (ref 11.5–15.5)
RDW: 12.4 % (ref 11.5–15.5)
WBC: 12.1 K/uL — ABNORMAL HIGH (ref 4.0–10.5)
WBC: 25.7 K/uL — ABNORMAL HIGH (ref 4.0–10.5)
WBC: 23 K/uL — ABNORMAL HIGH (ref 4.0–10.5)
nRBC: 0 % (ref 0.0–0.2)
nRBC: 0 % (ref 0.0–0.2)
nRBC: 0 % (ref 0.0–0.2)

## 2023-11-29 LAB — POCT I-STAT, CHEM 8
BUN: 10 mg/dL (ref 6–20)
BUN: 11 mg/dL (ref 6–20)
BUN: 10 mg/dL (ref 6–20)
BUN: 11 mg/dL (ref 6–20)
Calcium, Ion: 1.3 mmol/L (ref 1.15–1.40)
Calcium, Ion: 1.25 mmol/L (ref 1.15–1.40)
Calcium, Ion: 1.07 mmol/L — ABNORMAL LOW (ref 1.15–1.40)
Calcium, Ion: 1.15 mmol/L (ref 1.15–1.40)
Chloride: 102 mmol/L (ref 98–111)
Chloride: 102 mmol/L (ref 98–111)
Chloride: 102 mmol/L (ref 98–111)
Chloride: 101 mmol/L (ref 98–111)
Creatinine, Ser: 0.5 mg/dL (ref 0.44–1.00)
Creatinine, Ser: 0.6 mg/dL (ref 0.44–1.00)
Creatinine, Ser: 0.5 mg/dL (ref 0.44–1.00)
Creatinine, Ser: 0.6 mg/dL (ref 0.44–1.00)
Glucose, Bld: 183 mg/dL — ABNORMAL HIGH (ref 70–99)
Glucose, Bld: 154 mg/dL — ABNORMAL HIGH (ref 70–99)
Glucose, Bld: 117 mg/dL — ABNORMAL HIGH (ref 70–99)
Glucose, Bld: 140 mg/dL — ABNORMAL HIGH (ref 70–99)
HCT: 40 % (ref 36.0–46.0)
HCT: 36 % (ref 36.0–46.0)
HCT: 29 % — ABNORMAL LOW (ref 36.0–46.0)
HCT: 30 % — ABNORMAL LOW (ref 36.0–46.0)
Hemoglobin: 13.6 g/dL (ref 12.0–15.0)
Hemoglobin: 12.2 g/dL (ref 12.0–15.0)
Hemoglobin: 9.9 g/dL — ABNORMAL LOW (ref 12.0–15.0)
Hemoglobin: 10.2 g/dL — ABNORMAL LOW (ref 12.0–15.0)
Potassium: 4 mmol/L (ref 3.5–5.1)
Potassium: 4 mmol/L (ref 3.5–5.1)
Potassium: 5.1 mmol/L (ref 3.5–5.1)
Potassium: 4.9 mmol/L (ref 3.5–5.1)
Sodium: 138 mmol/L (ref 135–145)
Sodium: 138 mmol/L (ref 135–145)
Sodium: 137 mmol/L (ref 135–145)
Sodium: 137 mmol/L (ref 135–145)
TCO2: 21 mmol/L — ABNORMAL LOW (ref 22–32)
TCO2: 23 mmol/L (ref 22–32)
TCO2: 23 mmol/L (ref 22–32)
TCO2: 21 mmol/L — ABNORMAL LOW (ref 22–32)

## 2023-11-29 LAB — GLUCOSE, CAPILLARY
Glucose-Capillary: 150 mg/dL — ABNORMAL HIGH (ref 70–99)
Glucose-Capillary: 177 mg/dL — ABNORMAL HIGH (ref 70–99)
Glucose-Capillary: 172 mg/dL — ABNORMAL HIGH (ref 70–99)
Glucose-Capillary: 136 mg/dL — ABNORMAL HIGH (ref 70–99)
Glucose-Capillary: 158 mg/dL — ABNORMAL HIGH (ref 70–99)
Glucose-Capillary: 134 mg/dL — ABNORMAL HIGH (ref 70–99)
Glucose-Capillary: 139 mg/dL — ABNORMAL HIGH (ref 70–99)
Glucose-Capillary: 145 mg/dL — ABNORMAL HIGH (ref 70–99)
Glucose-Capillary: 155 mg/dL — ABNORMAL HIGH (ref 70–99)
Glucose-Capillary: 148 mg/dL — ABNORMAL HIGH (ref 70–99)

## 2023-11-29 LAB — PROTIME-INR
INR: 1.2 (ref 0.8–1.2)
Prothrombin Time: 16 s — ABNORMAL HIGH (ref 11.4–15.2)

## 2023-11-29 LAB — BASIC METABOLIC PANEL WITH GFR
Anion gap: 11 (ref 5–15)
Anion gap: 11 (ref 5–15)
BUN: 10 mg/dL (ref 6–20)
BUN: 10 mg/dL (ref 6–20)
CO2: 20 mmol/L — ABNORMAL LOW (ref 22–32)
CO2: 22 mmol/L (ref 22–32)
Calcium: 9.5 mg/dL (ref 8.9–10.3)
Calcium: 8.2 mg/dL — ABNORMAL LOW (ref 8.9–10.3)
Chloride: 101 mmol/L (ref 98–111)
Chloride: 105 mmol/L (ref 98–111)
Creatinine, Ser: 0.51 mg/dL (ref 0.44–1.00)
Creatinine, Ser: 0.62 mg/dL (ref 0.44–1.00)
GFR, Estimated: >60 mL/min (ref >60)
GFR, Estimated: >60 mL/min (ref >60)
Glucose, Bld: 186 mg/dL — ABNORMAL HIGH (ref 70–99)
Glucose, Bld: 137 mg/dL — ABNORMAL HIGH (ref 70–99)
Potassium: 3.9 mmol/L (ref 3.5–5.1)
Potassium: 4.2 mmol/L (ref 3.5–5.1)
Sodium: 132 mmol/L — ABNORMAL LOW (ref 135–145)
Sodium: 138 mmol/L (ref 135–145)

## 2023-11-29 LAB — POCT I-STAT EG7
Acid-base deficit: 3 mmol/L — ABNORMAL HIGH (ref 0.0–2.0)
Bicarbonate: 22.6 mmol/L (ref 20.0–28.0)
Calcium, Ion: 1.07 mmol/L — ABNORMAL LOW (ref 1.15–1.40)
HCT: 27 % — ABNORMAL LOW (ref 36.0–46.0)
Hemoglobin: 9.2 g/dL — ABNORMAL LOW (ref 12.0–15.0)
O2 Saturation: 74 %
Potassium: 3.7 mmol/L (ref 3.5–5.1)
Sodium: 139 mmol/L (ref 135–145)
TCO2: 24 mmol/L (ref 22–32)
pCO2, Ven: 43.1 mmHg — ABNORMAL LOW (ref 44–60)
pH, Ven: 7.327 (ref 7.25–7.43)
pO2, Ven: 43 mmHg (ref 32–45)

## 2023-11-29 LAB — ECHO INTRAOPERATIVE TEE
AV Mean grad: 5 mmHg
AV Peak grad: 8.5 mmHg
Ao pk vel: 1.46 m/s
Height: 62 in
S' Lateral: 2.4 cm
Weight: 3737.6 [oz_av]

## 2023-11-29 LAB — PLATELET COUNT: Platelets: 270 K/uL (ref 150–400)

## 2023-11-29 LAB — MAGNESIUM: Magnesium: 2.8 mg/dL — ABNORMAL HIGH (ref 1.7–2.4)

## 2023-11-29 LAB — HEMOGLOBIN AND HEMATOCRIT, BLOOD
HCT: 28.3 % — ABNORMAL LOW (ref 36.0–46.0)
Hemoglobin: 9.8 g/dL — ABNORMAL LOW (ref 12.0–15.0)

## 2023-11-29 LAB — APTT: aPTT: 27 s (ref 24–36)

## 2023-11-29 LAB — HEPARIN LEVEL (UNFRACTIONATED): Heparin Unfractionated: 0.18 [IU]/mL — ABNORMAL LOW (ref 0.30–0.70)

## 2023-11-29 SURGERY — CORONARY ARTERY BYPASS GRAFTING (CABG)
Anesthesia: General | Site: Chest

## 2023-11-29 SURGERY — LEFT HEART CATH AND CORONARY ANGIOGRAPHY
Anesthesia: LOCAL

## 2023-11-29 MED ORDER — ONDANSETRON HCL 4 MG/2ML IJ SOLN
4.0000 mg | Freq: Four times a day (QID) | INTRAMUSCULAR | Status: DC | PRN
  Administered 2023-11-29 – 2023-12-01 (×3): 4 mg via INTRAVENOUS
  Filled 2023-11-29 (×3): qty 2

## 2023-11-29 MED ORDER — 0.9 % SODIUM CHLORIDE (POUR BTL) OPTIME
TOPICAL | Status: DC | PRN
Start: 2023-11-29 — End: 2023-11-29
  Administered 2023-11-29: 4000 mL

## 2023-11-29 MED ORDER — CHLORHEXIDINE GLUCONATE 0.12 % MT SOLN
15.0000 mL | OROMUCOSAL | Status: AC
  Administered 2023-11-29: 15 mL via OROMUCOSAL
  Filled 2023-11-29: qty 15

## 2023-11-29 MED ORDER — PLASMA-LYTE A IV SOLN
INTRAVENOUS | Status: DC | PRN
  Administered 2023-11-29: 300 mL via INTRAVASCULAR

## 2023-11-29 MED ORDER — PANTOPRAZOLE SODIUM 40 MG PO TBEC
40.0000 mg | DELAYED_RELEASE_TABLET | Freq: Every day | ORAL | Status: DC
  Administered 2023-12-01 – 2023-12-03 (×3): 40 mg via ORAL
  Filled 2023-11-29 (×3): qty 1

## 2023-11-29 MED ORDER — ACETAMINOPHEN 500 MG PO TABS: 1000.0000 mg | ORAL_TABLET | Freq: Four times a day (QID) | ORAL | Status: DC

## 2023-11-29 MED ORDER — PHENYLEPHRINE 80 MCG/ML (10ML) SYRINGE FOR IV PUSH (FOR BLOOD PRESSURE SUPPORT)
PREFILLED_SYRINGE | INTRAVENOUS | Status: AC
Start: 2023-11-29 — End: 2023-11-29
  Filled 2023-11-29: qty 10

## 2023-11-29 MED ORDER — ACETAMINOPHEN 160 MG/5ML PO SOLN: 650.0000 mg | Freq: Once | ORAL | Status: DC

## 2023-11-29 MED ORDER — LACTATED RINGERS IV SOLN: INTRAVENOUS | Status: AC

## 2023-11-29 MED ORDER — ORAL CARE MOUTH RINSE: 15.0000 mL | Freq: Once | OROMUCOSAL | Status: AC

## 2023-11-29 MED ORDER — FENTANYL CITRATE (PF) 250 MCG/5ML IJ SOLN
INTRAMUSCULAR | Status: AC
  Filled 2023-11-29: qty 5

## 2023-11-29 MED ORDER — ASPIRIN 325 MG PO TBEC: 325.0000 mg | DELAYED_RELEASE_TABLET | Freq: Every day | ORAL | Status: DC

## 2023-11-29 MED ORDER — PROPOFOL 10 MG/ML IV BOLUS
INTRAVENOUS | Status: AC
  Filled 2023-11-29: qty 20

## 2023-11-29 MED ORDER — FENTANYL CITRATE (PF) 250 MCG/5ML IJ SOLN
INTRAMUSCULAR | Status: DC | PRN
  Administered 2023-11-29: 200 ug via INTRAVENOUS
  Administered 2023-11-29 (×2): 50 ug via INTRAVENOUS
  Administered 2023-11-29: 100 ug via INTRAVENOUS
  Administered 2023-11-29: 150 ug via INTRAVENOUS
  Administered 2023-11-29 (×4): 100 ug via INTRAVENOUS
  Administered 2023-11-29 (×2): 150 ug via INTRAVENOUS

## 2023-11-29 MED ORDER — BISACODYL 10 MG RE SUPP: 10.0000 mg | Freq: Every day | RECTAL | Status: DC

## 2023-11-29 MED ORDER — METOPROLOL TARTRATE 25 MG/10 ML ORAL SUSPENSION: 12.5000 mg | Freq: Two times a day (BID) | ORAL | Status: DC

## 2023-11-29 MED ORDER — PHENYLEPHRINE HCL-NACL 20-0.9 MG/250ML-% IV SOLN: 0.0000 ug/min | INTRAVENOUS | Status: DC

## 2023-11-29 MED ORDER — ACETAMINOPHEN 160 MG/5ML PO SOLN: 1000.0000 mg | Freq: Four times a day (QID) | ORAL | Status: DC

## 2023-11-29 MED ORDER — CALCIUM CHLORIDE 10 % IV SOLN
INTRAVENOUS | Status: DC | PRN
  Administered 2023-11-29 (×5): 100 mg via INTRAVENOUS

## 2023-11-29 MED ORDER — LACTATED RINGERS IV SOLN: INTRAVENOUS | Status: DC | PRN

## 2023-11-29 MED ORDER — ASPIRIN 81 MG PO CHEW
324.0000 mg | CHEWABLE_TABLET | Freq: Every day | ORAL | Status: DC
  Filled 2023-11-29: qty 4

## 2023-11-29 MED ORDER — PROPOFOL 10 MG/ML IV BOLUS
INTRAVENOUS | Status: DC | PRN
Start: 2023-11-29 — End: 2023-11-29
  Administered 2023-11-29: 100 mg via INTRAVENOUS
  Administered 2023-11-29: 50 mg via INTRAVENOUS

## 2023-11-29 MED ORDER — ROCURONIUM BROMIDE 10 MG/ML (PF) SYRINGE
PREFILLED_SYRINGE | INTRAVENOUS | Status: AC
  Filled 2023-11-29: qty 10

## 2023-11-29 MED ORDER — METOPROLOL TARTRATE 12.5 MG HALF TABLET
ORAL_TABLET | ORAL | Status: AC
  Filled 2023-11-29: qty 1

## 2023-11-29 MED ORDER — DEXMEDETOMIDINE HCL IN NACL 400 MCG/100ML IV SOLN
0.0000 ug/kg/h | INTRAVENOUS | Status: DC
  Administered 2023-11-29: 0.2 ug/kg/h via INTRAVENOUS
  Filled 2023-11-29: qty 100

## 2023-11-29 MED ORDER — METOPROLOL TARTRATE 12.5 MG HALF TABLET
12.5000 mg | ORAL_TABLET | Freq: Two times a day (BID) | ORAL | Status: DC
  Administered 2023-11-30 – 2023-12-03 (×7): 12.5 mg via ORAL
  Filled 2023-11-29 (×7): qty 1

## 2023-11-29 MED ORDER — MIDAZOLAM HCL (PF) 10 MG/2ML IJ SOLN
INTRAMUSCULAR | Status: AC
  Filled 2023-11-29: qty 2

## 2023-11-29 MED ORDER — HEPARIN SODIUM (PORCINE) 1000 UNIT/ML IJ SOLN
INTRAMUSCULAR | Status: DC | PRN
  Administered 2023-11-29: 2000 [IU] via INTRAVENOUS
  Administered 2023-11-29: 8000 [IU] via INTRAVENOUS
  Administered 2023-11-29: 33000 [IU] via INTRAVENOUS

## 2023-11-29 MED ORDER — CHLORHEXIDINE GLUCONATE 0.12 % MT SOLN
OROMUCOSAL | Status: AC
  Administered 2023-11-29: 15 mL via OROMUCOSAL
  Filled 2023-11-29: qty 15

## 2023-11-29 MED ORDER — TRAMADOL HCL 50 MG PO TABS
50.0000 mg | ORAL_TABLET | ORAL | Status: DC | PRN
  Administered 2023-11-30 (×2): 100 mg via ORAL
  Administered 2023-11-30: 50 mg via ORAL
  Administered 2023-12-01 – 2023-12-03 (×3): 100 mg via ORAL
  Filled 2023-11-29 (×2): qty 2
  Filled 2023-11-29: qty 1
  Filled 2023-11-29 (×3): qty 2

## 2023-11-29 MED ORDER — ASPIRIN 81 MG PO CHEW
324.0000 mg | CHEWABLE_TABLET | Freq: Once | ORAL | Status: AC
  Administered 2023-11-29: 324 mg via ORAL
  Filled 2023-11-29: qty 4

## 2023-11-29 MED ORDER — HEMOSTATIC AGENTS (NO CHARGE) OPTIME
TOPICAL | Status: DC | PRN
  Administered 2023-11-29: 1 via TOPICAL

## 2023-11-29 MED ORDER — SODIUM CHLORIDE 0.9% FLUSH
3.0000 mL | Freq: Two times a day (BID) | INTRAVENOUS | Status: DC
  Administered 2023-11-30 (×2): 3 mL via INTRAVENOUS

## 2023-11-29 MED ORDER — POTASSIUM CHLORIDE 10 MEQ/50ML IV SOLN: 10.0000 meq | INTRAVENOUS | Status: AC

## 2023-11-29 MED ORDER — OXYCODONE HCL 5 MG PO TABS
5.0000 mg | ORAL_TABLET | ORAL | Status: DC | PRN
  Administered 2023-11-29 – 2023-12-02 (×8): 10 mg via ORAL
  Filled 2023-11-29 (×8): qty 2

## 2023-11-29 MED ORDER — HEPARIN SODIUM (PORCINE) 1000 UNIT/ML IJ SOLN
INTRAMUSCULAR | Status: AC
  Filled 2023-11-29: qty 1

## 2023-11-29 MED ORDER — PANTOPRAZOLE SODIUM 40 MG IV SOLR
40.0000 mg | Freq: Every day | INTRAVENOUS | Status: AC
  Administered 2023-11-29 – 2023-11-30 (×2): 40 mg via INTRAVENOUS
  Filled 2023-11-29 (×2): qty 10

## 2023-11-29 MED ORDER — DOCUSATE SODIUM 100 MG PO CAPS
200.0000 mg | ORAL_CAPSULE | Freq: Every day | ORAL | Status: DC
  Administered 2023-11-30 – 2023-12-03 (×4): 200 mg via ORAL
  Filled 2023-11-29 (×4): qty 2

## 2023-11-29 MED ORDER — ASPIRIN 81 MG PO CHEW: 324.0000 mg | CHEWABLE_TABLET | Freq: Every day | ORAL | Status: DC

## 2023-11-29 MED ORDER — ASPIRIN 325 MG PO TBEC
325.0000 mg | DELAYED_RELEASE_TABLET | Freq: Every day | ORAL | Status: DC
  Administered 2023-11-30 – 2023-12-03 (×4): 325 mg via ORAL
  Filled 2023-11-29 (×4): qty 1

## 2023-11-29 MED ORDER — MORPHINE SULFATE (PF) 2 MG/ML IV SOLN
1.0000 mg | INTRAVENOUS | Status: DC | PRN
  Administered 2023-11-29: 2 mg via INTRAVENOUS
  Administered 2023-11-29: 4 mg via INTRAVENOUS
  Administered 2023-11-29: 2 mg via INTRAVENOUS
  Administered 2023-11-29: 4 mg via INTRAVENOUS
  Administered 2023-11-30: 1 mg via INTRAVENOUS
  Administered 2023-11-30: 4 mg via INTRAVENOUS
  Administered 2023-11-30 – 2023-12-01 (×3): 2 mg via INTRAVENOUS
  Filled 2023-11-29: qty 2
  Filled 2023-11-29 (×6): qty 1
  Filled 2023-11-29 (×2): qty 2

## 2023-11-29 MED ORDER — ACETAMINOPHEN 500 MG PO TABS
1000.0000 mg | ORAL_TABLET | Freq: Four times a day (QID) | ORAL | Status: DC
  Administered 2023-11-29 – 2023-12-03 (×14): 1000 mg via ORAL
  Filled 2023-11-29 (×14): qty 2

## 2023-11-29 MED ORDER — ONDANSETRON HCL 4 MG/2ML IJ SOLN
INTRAMUSCULAR | Status: AC
  Filled 2023-11-29: qty 2

## 2023-11-29 MED ORDER — CEFAZOLIN SODIUM-DEXTROSE 2-4 GM/100ML-% IV SOLN
2.0000 g | Freq: Three times a day (TID) | INTRAVENOUS | Status: AC
  Administered 2023-11-29 – 2023-12-01 (×6): 2 g via INTRAVENOUS
  Filled 2023-11-29 (×6): qty 100

## 2023-11-29 MED ORDER — CALCIUM CHLORIDE 10 % IV SOLN
INTRAVENOUS | Status: AC
  Filled 2023-11-29: qty 10

## 2023-11-29 MED ORDER — METOPROLOL TARTRATE 12.5 MG HALF TABLET: 12.5000 mg | ORAL_TABLET | Freq: Two times a day (BID) | ORAL | Status: DC

## 2023-11-29 MED ORDER — ONDANSETRON HCL 4 MG/2ML IJ SOLN
INTRAMUSCULAR | Status: DC | PRN
  Administered 2023-11-29: 4 mg via INTRAVENOUS

## 2023-11-29 MED ORDER — NITROGLYCERIN IN D5W 200-5 MCG/ML-% IV SOLN: 7.0000 ug/min | INTRAVENOUS | Status: DC

## 2023-11-29 MED ORDER — SODIUM CHLORIDE 0.9% FLUSH: 3.0000 mL | INTRAVENOUS | Status: DC | PRN

## 2023-11-29 MED ORDER — MIDAZOLAM HCL (PF) 2 MG/2ML IJ SOLN
2.0000 mg | INTRAMUSCULAR | Status: DC | PRN
  Administered 2023-11-29: 2 mg via INTRAVENOUS
  Filled 2023-11-29: qty 2

## 2023-11-29 MED ORDER — LACTATED RINGERS IV SOLN: INTRAVENOUS | Status: DC

## 2023-11-29 MED ORDER — DEXTROSE 50 % IV SOLN: 0.0000 mL | INTRAVENOUS | Status: DC | PRN

## 2023-11-29 MED ORDER — BISACODYL 5 MG PO TBEC
10.0000 mg | DELAYED_RELEASE_TABLET | Freq: Every day | ORAL | Status: DC
  Administered 2023-11-30 – 2023-12-03 (×4): 10 mg via ORAL
  Filled 2023-11-29 (×4): qty 2

## 2023-11-29 MED ORDER — SODIUM CHLORIDE 0.9 % IV SOLN: INTRAVENOUS | Status: AC

## 2023-11-29 MED ORDER — ACETAMINOPHEN 325 MG PO TABS
650.0000 mg | ORAL_TABLET | Freq: Once | ORAL | Status: AC
  Administered 2023-11-29: 650 mg via ORAL
  Filled 2023-11-29: qty 2

## 2023-11-29 MED ORDER — PROTAMINE SULFATE 10 MG/ML IV SOLN
INTRAVENOUS | Status: AC
  Filled 2023-11-29: qty 10

## 2023-11-29 MED ORDER — ALBUMIN HUMAN 5 % IV SOLN: INTRAVENOUS | Status: DC | PRN

## 2023-11-29 MED ORDER — ALBUMIN HUMAN 5 % IV SOLN: 250.0000 mL | INTRAVENOUS | Status: DC | PRN

## 2023-11-29 MED ORDER — SODIUM CHLORIDE 0.9 % IV SOLN: INTRAVENOUS | Status: DC | PRN

## 2023-11-29 MED ORDER — INSULIN REGULAR(HUMAN) IN NACL 100-0.9 UT/100ML-% IV SOLN
INTRAVENOUS | Status: DC
  Administered 2023-11-29: 6 [IU]/h via INTRAVENOUS
  Administered 2023-11-30: 3.6 [IU]/h via INTRAVENOUS
  Filled 2023-11-29: qty 100

## 2023-11-29 MED ORDER — SODIUM CHLORIDE (PF) 0.9 % IJ SOLN
INTRAMUSCULAR | Status: AC
  Filled 2023-11-29: qty 10

## 2023-11-29 MED ORDER — SODIUM BICARBONATE 8.4 % IV SOLN
50.0000 meq | Freq: Once | INTRAVENOUS | Status: AC
  Administered 2023-11-29: 50 meq via INTRAVENOUS

## 2023-11-29 MED ORDER — SUGAMMADEX SODIUM 200 MG/2ML IV SOLN
INTRAVENOUS | Status: DC | PRN
  Administered 2023-11-29: 200 mg via INTRAVENOUS

## 2023-11-29 MED ORDER — PROTAMINE SULFATE 10 MG/ML IV SOLN
INTRAVENOUS | Status: DC | PRN
  Administered 2023-11-29: 320 mg via INTRAVENOUS

## 2023-11-29 MED ORDER — VANCOMYCIN HCL IN DEXTROSE 1-5 GM/200ML-% IV SOLN
1500.0000 mg | Freq: Once | INTRAVENOUS | Status: AC
  Administered 2023-11-29: 1500 mg via INTRAVENOUS
  Filled 2023-11-29: qty 400

## 2023-11-29 MED ORDER — METOPROLOL TARTRATE 5 MG/5ML IV SOLN: 2.5000 mg | INTRAVENOUS | Status: DC | PRN

## 2023-11-29 MED ORDER — PROTAMINE SULFATE 10 MG/ML IV SOLN
INTRAVENOUS | Status: AC
  Filled 2023-11-29: qty 25

## 2023-11-29 MED ORDER — SODIUM CHLORIDE 0.45 % IV SOLN: INTRAVENOUS | Status: AC | PRN

## 2023-11-29 MED ORDER — MIDAZOLAM HCL (PF) 5 MG/ML IJ SOLN
INTRAMUSCULAR | Status: DC | PRN
  Administered 2023-11-29: 2 mg via INTRAVENOUS
  Administered 2023-11-29 (×2): 1 mg via INTRAVENOUS

## 2023-11-29 MED ORDER — METOCLOPRAMIDE HCL 5 MG/ML IJ SOLN
10.0000 mg | Freq: Four times a day (QID) | INTRAMUSCULAR | Status: AC
  Administered 2023-11-29 – 2023-11-30 (×6): 10 mg via INTRAVENOUS
  Filled 2023-11-29 (×6): qty 2

## 2023-11-29 MED ORDER — PHENYLEPHRINE 80 MCG/ML (10ML) SYRINGE FOR IV PUSH (FOR BLOOD PRESSURE SUPPORT)
PREFILLED_SYRINGE | INTRAVENOUS | Status: DC | PRN
  Administered 2023-11-29 (×11): 80 ug via INTRAVENOUS

## 2023-11-29 MED ORDER — CHLORHEXIDINE GLUCONATE CLOTH 2 % EX PADS
6.0000 | MEDICATED_PAD | Freq: Every day | CUTANEOUS | Status: DC
  Administered 2023-11-29 – 2023-12-01 (×3): 6 via TOPICAL

## 2023-11-29 MED ORDER — ROCURONIUM BROMIDE 10 MG/ML (PF) SYRINGE
PREFILLED_SYRINGE | INTRAVENOUS | Status: AC
Start: 2023-11-29 — End: 2023-11-29
  Filled 2023-11-29: qty 20

## 2023-11-29 MED ORDER — CHLORHEXIDINE GLUCONATE 0.12 % MT SOLN: 15.0000 mL | Freq: Once | OROMUCOSAL | Status: AC

## 2023-11-29 MED ORDER — LIDOCAINE 2% (20 MG/ML) 5 ML SYRINGE
INTRAMUSCULAR | Status: AC
  Filled 2023-11-29: qty 5

## 2023-11-29 MED ORDER — SODIUM CHLORIDE (PF) 0.9 % IJ SOLN
OROMUCOSAL | Status: DC | PRN
  Administered 2023-11-29 (×4): 4 mL via TOPICAL

## 2023-11-29 MED ORDER — ROCURONIUM BROMIDE 10 MG/ML (PF) SYRINGE
PREFILLED_SYRINGE | INTRAVENOUS | Status: DC | PRN
  Administered 2023-11-29: 50 mg via INTRAVENOUS
  Administered 2023-11-29: 30 mg via INTRAVENOUS
  Administered 2023-11-29: 50 mg via INTRAVENOUS
  Administered 2023-11-29: 20 mg via INTRAVENOUS
  Administered 2023-11-29: 70 mg via INTRAVENOUS

## 2023-11-29 MED ORDER — MAGNESIUM SULFATE 4 GM/100ML IV SOLN
4.0000 g | Freq: Once | INTRAVENOUS | Status: AC
  Administered 2023-11-29: 4 g via INTRAVENOUS
  Filled 2023-11-29: qty 100

## 2023-11-29 MED ORDER — SODIUM CHLORIDE 0.9 % IV SOLN: 250.0000 mL | INTRAVENOUS | Status: AC

## 2023-11-29 SURGICAL SUPPLY — 81 items
ADAPTER MULTI PERFUSION 15 (ADAPTER) ×3 IMPLANT
BAG DECANTER FOR FLEXI CONT (MISCELLANEOUS) ×3 IMPLANT
BLADE CLIPPER SURG (BLADE) ×3 IMPLANT
BLADE STERNUM SYSTEM 6 (BLADE) ×3 IMPLANT
BLADE SURG 15 STRL LF DISP TIS (BLADE) ×3 IMPLANT
BNDG ELASTIC 4X5.8 VLCR NS LF (GAUZE/BANDAGES/DRESSINGS) IMPLANT
BNDG ELASTIC 4X5.8 VLCR STR LF (GAUZE/BANDAGES/DRESSINGS) ×3 IMPLANT
BNDG GAUZE DERMACEA FLUFF 4 (GAUZE/BANDAGES/DRESSINGS) ×3 IMPLANT
CANISTER SUCTION 3000ML PPV (SUCTIONS) ×3 IMPLANT
CANNULA AORTIC ROOT 9FR (CANNULA) ×3 IMPLANT
CANNULA EZ GLIDE AORTIC 21FR (CANNULA) IMPLANT
CANNULA MC2 2 STG 36/46 NON-V (CANNULA) IMPLANT
CANNULA VESSEL 3MM BLUNT TIP (CANNULA) ×9 IMPLANT
CATH ROBINSON RED A/P 18FR (CATHETERS) ×3 IMPLANT
CATH THORACIC 36FR (CATHETERS) ×3 IMPLANT
CATH THORACIC 36FR RT ANG (CATHETERS) ×3 IMPLANT
CLIP APPLIE 9.375 SM OPEN (CLIP) IMPLANT
CLIP TI MEDIUM 24 (CLIP) IMPLANT
CLIP TI WIDE RED SMALL 24 (CLIP) IMPLANT
CONTAINER PROTECT SURGISLUSH (MISCELLANEOUS) ×6 IMPLANT
COVER MAYO STAND STRL (DRAPES) ×3 IMPLANT
DRAPE EXTREMITY T 121X128X90 (DISPOSABLE) ×3 IMPLANT
DRAPE HALF SHEET 40X57 (DRAPES) ×3 IMPLANT
DRAPE SRG 135X102X78XABS (DRAPES) ×3 IMPLANT
DRAPE WARM FLUID 44X44 (DRAPES) ×3 IMPLANT
DRSG COVADERM 4X14 (GAUZE/BANDAGES/DRESSINGS) ×3 IMPLANT
ELECTRODE BLDE 4.0 EZ CLN MEGD (MISCELLANEOUS) IMPLANT
ELECTRODE REM PT RTRN 9FT ADLT (ELECTROSURGICAL) ×6 IMPLANT
FELT TEFLON 1X6 (MISCELLANEOUS) ×3 IMPLANT
GAUZE SPONGE 4X4 12PLY STRL (GAUZE/BANDAGES/DRESSINGS) ×6 IMPLANT
GEL ULTRASOUND 20GR AQUASONIC (MISCELLANEOUS) IMPLANT
GLOVE SS BIOGEL STRL SZ 7.5 (GLOVE) ×3 IMPLANT
GLOVE SURG SS PI 6.5 STRL IVOR (GLOVE) IMPLANT
GOWN STRL REUS W/ TWL LRG LVL3 (GOWN DISPOSABLE) ×12 IMPLANT
GOWN STRL REUS W/ TWL XL LVL3 (GOWN DISPOSABLE) ×6 IMPLANT
HEMOSTAT POWDER SURGIFOAM 1G (HEMOSTASIS) ×12 IMPLANT
HEMOSTAT SURGICEL 2X14 (HEMOSTASIS) ×3 IMPLANT
KIT BASIN OR (CUSTOM PROCEDURE TRAY) ×3 IMPLANT
KIT SUCTION CATH 14FR (SUCTIONS) ×6 IMPLANT
KIT TURNOVER KIT B (KITS) ×3 IMPLANT
MARKER GRAFT CORONARY BYPASS (MISCELLANEOUS) ×9 IMPLANT
PACK E OPEN HEART (SUTURE) ×3 IMPLANT
PACK OPEN HEART (CUSTOM PROCEDURE TRAY) ×3 IMPLANT
PAD ARMBOARD POSITIONER FOAM (MISCELLANEOUS) ×6 IMPLANT
PAD ELECT DEFIB RADIOL ZOLL (MISCELLANEOUS) ×3 IMPLANT
PENCIL BUTTON HOLSTER BLD 10FT (ELECTRODE) ×3 IMPLANT
POSITIONER HEAD DONUT 9IN (MISCELLANEOUS) ×3 IMPLANT
PUNCH AORTIC ROTATE 4.0MM (MISCELLANEOUS) IMPLANT
PUNCH AORTIC ROTATE 4.5MM 8IN (MISCELLANEOUS) IMPLANT
PUNCH AORTIC ROTATE 5MM 8IN (MISCELLANEOUS) IMPLANT
SET MPS 3-ND DEL (MISCELLANEOUS) IMPLANT
SHEARS HARMONIC 9CM CVD (BLADE) ×3 IMPLANT
SOLN 0.9% NACL POUR BTL 1000ML (IV SOLUTION) ×15 IMPLANT
SOLN STERILE WATER BTL 1000 ML (IV SOLUTION) ×6 IMPLANT
SUPPORT HEART JANKE-BARRON (MISCELLANEOUS) ×3 IMPLANT
SUT BONE WAX W31G (SUTURE) ×3 IMPLANT
SUT MNCRL AB 4-0 PS2 18 (SUTURE) IMPLANT
SUT PROLENE 3 0 SH DA (SUTURE) ×3 IMPLANT
SUT PROLENE 4 0 SH DA (SUTURE) IMPLANT
SUT PROLENE 4-0 RB1 .5 CRCL 36 (SUTURE) ×3 IMPLANT
SUT PROLENE 6 0 C 1 30 (SUTURE) ×6 IMPLANT
SUT PROLENE 7 0 BV 1 (SUTURE) IMPLANT
SUT PROLENE 7 0 BV1 MDA (SUTURE) ×3 IMPLANT
SUT PROLENE 8 0 BV175 6 (SUTURE) IMPLANT
SUT STEEL 6MS V (SUTURE) ×3 IMPLANT
SUT STEEL SZ 6 DBL 3X14 BALL (SUTURE) ×3 IMPLANT
SUT VIC AB 1 CTX36XBRD ANBCTR (SUTURE) ×6 IMPLANT
SUT VIC AB 2-0 CT1 TAPERPNT 27 (SUTURE) IMPLANT
SUT VIC AB 2-0 CTX 27 (SUTURE) IMPLANT
SUT VIC AB 3-0 SH 27X BRD (SUTURE) IMPLANT
SUT VIC AB 3-0 X1 27 (SUTURE) IMPLANT
SYSTEM SAHARA CHEST DRAIN ATS (WOUND CARE) ×3 IMPLANT
TAPE CLOTH SOFT 2X10 (GAUZE/BANDAGES/DRESSINGS) IMPLANT
TAPE CLOTH SURG 4X10 WHT LF (GAUZE/BANDAGES/DRESSINGS) IMPLANT
TOWEL GREEN STERILE (TOWEL DISPOSABLE) ×3 IMPLANT
TOWEL GREEN STERILE FF (TOWEL DISPOSABLE) ×3 IMPLANT
TRAY FOLEY SLVR 16FR TEMP STAT (SET/KITS/TRAYS/PACK) ×3 IMPLANT
TUBE SUCT INTRACARD DLP 20F (MISCELLANEOUS) ×3 IMPLANT
TUBE SUCTION CARDIAC 10FR (CANNULA) ×3 IMPLANT
TUBING LAP HI FLOW INSUFFLATIO (TUBING) ×3 IMPLANT
UNDERPAD 30X36 HEAVY ABSORB (UNDERPADS AND DIAPERS) ×3 IMPLANT

## 2023-11-29 NOTE — Transfer of Care (Signed)
 Immediate Anesthesia Transfer of Care Note  Patient: Jasmin Gordon [facility]  Procedure(s) Performed: CORONARY ARTERY BYPASS GRAFTING (CABG) TIMES TWO USING LEFT RADIAL ARTERY AND LEFT INTERNAL MAMMARY ARTERY (Chest) SURGICAL PROCUREMENT, ARTERY, RADIAL (Left) ECHOCARDIOGRAM, TRANSESOPHAGEAL, INTRAOPERATIVE  Patient Location: ICU  Anesthesia Type:General  Level of Consciousness: sedated and Patient remains intubated per anesthesia plan  Airway & Oxygen Therapy: Patient remains intubated per anesthesia plan and Patient placed on Ventilator (see vital sign flow sheet for setting)  Post-op Assessment: Report given to RN and Post -op Vital signs reviewed and stable  Post vital signs: Reviewed and stable  Last Vitals:  Vitals Value Taken Time  BP 115/76 11/29/23 13:00  Temp 37.1 C 11/29/23 13:02  Pulse 88 11/29/23 13:02  Resp    SpO2 94 % 11/29/23 13:02  Vitals shown include unfiled device data.  Last Pain:  Vitals:   11/29/23 0648  TempSrc:   PainSc: 0-No pain      Patients Stated Pain Goal: 0 (11/28/23 0900)  Complications: No notable events documented.

## 2023-11-29 NOTE — Progress Notes (Signed)
 TCTS PM Rounding Progress Note  Extubated, about to dangle On a little neo, nitroglycerin for radial artery graft Minimal chest tube output, urine output good  Vitals:   11/29/23 1645 11/29/23 1700  BP:  98/78  Pulse: (!) 104 (!) 103  Resp:    Temp: 99.7 F (37.6 C) 99.7 F (37.6 C)  SpO2: 96% 97%    Plan: - Continue nitroglycerin, wean neo as tolerated  Con Clunes, MD Cardiothoracic Surgery Pager: [phone]

## 2023-11-29 NOTE — Hospital Course (Addendum)
 History of Present Illness:  Nayelis Bonito is a 53 yo female with multiple cardiac risk factors including hypertension, diabetes, class 3 obesity, hyperlipidemia and obstructive sleep apnea.  The patient presented to the emergency department on 11/26/2023 with chief complaint of chest pain.   She reports that the pain is something she has noted since September and she was initially seen by cardiology and a CT coronary study showed proximal LAD lesion that was felt to be hemodynamically significant.  She describes the pain as substernal without radiation.  Initially it was felt to be primarily exertional but over time she developed resting symptoms.  In the ER initial troponins were obtained x 2 and were negative for elevation.  A chest CTA was obtained which was negative for dissection or intramural hematoma.  There is no evidence of aneurysm.  There was no evidence of pulmonary embolism within the limitations of this exam.  She was initially discharged but re- presented back to the emergency department on the same day with similar symptoms and her troponin was 30 prompting cardiology consultation and subsequent admission.  It is noted that she was scheduled for cardiac catheterization on October 29 previously.  She was admitted for further evaluation and treatment including cardiac catheterization.  Hospital Course:   Catheterization was performed on 10/27 and revealed a proximal LAD lesion that is 90% and a 70% first diagonal stenosis.  Anatomically this is not felt to be a good PCI situation and we are asked to consider surgical options.  Echocardiogram was performed on 11/15/2023 and she is noted to have normal LV and RV function no significant valvular disease.  She was evaluated by Dr. Kerrin who felt she would be a reasonable candidate for coronary bypass grafting.  The risks and benefits of the procedure were explained to the patient and she was agreeable to proceed.  She was taken to the operating  room on 11/29/2023.  She underwent CABG x 2 utilizing LIMA to LAD, Radial artery to Diagonal.  She also underwent open harvest of left radial artery.  She tolerated the procedure without difficulty and was taken to the SICU in stable condition.  Vital signs and hemodynamics remained stable.  Ventilator support was weaned routinely allowing for sedation by 5 PM on the day of surgery.  Postop day 1, the PA catheter and arterial line were removed along with the patient wires.  Norvasc  was initiated for optimization of patency.  She was transition from insulin drip to subcu insulin and sliding scale coverage.  Chest tubes were removed and she was mobilized  She remained in Sinus Rhythm but was tachycardic.  She was unable to have titration of her BB due to low blood pressure.  Her pacing wires were removed without difficulty.  She was felt stable for transfer to the progressive care unit on 12/01/2023.  The patient continued to do well.  Her weight was below baseline and Lasix was discontinued.  She had mild tingling in her left thumb from her radial artery harvest site.

## 2023-11-29 NOTE — Anesthesia Procedure Notes (Signed)
 Procedure Name: Intubation Date/Time: 11/29/2023 8:10 AM  Performed by: Cindie Donald CROME, CRNAPre-anesthesia Checklist: Patient identified, Emergency Drugs available, Suction available and Patient being monitored Patient Re-evaluated:Patient Re-evaluated prior to induction Oxygen Delivery Method: Circle System Utilized Preoxygenation: Pre-oxygenation with 100% oxygen Induction Type: IV induction Ventilation: Mask ventilation without difficulty and Oral airway inserted - appropriate to patient size Laryngoscope Size: Mac and 3 Grade View: Grade II Tube type: Oral Tube size: 7.5 mm Number of attempts: 1 Airway Equipment and Method: Stylet and Oral airway Placement Confirmation: ETT inserted through vocal cords under direct vision, positive ETCO2 and breath sounds checked- equal and bilateral Secured at: 21 cm Tube secured with: Tape Dental Injury: Teeth and Oropharynx as per pre-operative assessment

## 2023-11-29 NOTE — Anesthesia Preprocedure Evaluation (Addendum)
 Anesthesia Evaluation  Patient identified by MRN, date of birth, ID band Patient awake    Reviewed: Allergy & Precautions, NPO status , Patient's Chart, lab work & pertinent test results  Airway Mallampati: II  TM Distance: >3 FB Neck ROM: Full    Dental  (+) Teeth Intact, Dental Advisory Given   Pulmonary sleep apnea    breath sounds clear to auscultation       Cardiovascular hypertension, Pt. on home beta blockers and Pt. on medications + angina  + CAD   Rhythm:Regular Rate:Normal     Neuro/Psych  PSYCHIATRIC DISORDERS Anxiety Depression     Neuromuscular disease    GI/Hepatic Neg liver ROS,GERD  ,,  Endo/Other  diabetes, Type 2, Insulin Dependent  Class 3 obesity  Renal/GU negative Renal ROS     Musculoskeletal negative musculoskeletal ROS (+)    Abdominal   Peds  Hematology negative hematology ROS (+)   Anesthesia Other Findings   Reproductive/Obstetrics                              Anesthesia Physical Anesthesia Plan  ASA: 4  Anesthesia Plan: General   Post-op Pain Management: Ofirmev  IV (intra-op)*   Induction: Intravenous  PONV Risk Score and Plan: 4 or greater and Ondansetron  and Midazolam   Airway Management Planned: Oral ETT  Additional Equipment: Arterial line, CVP, PA Cath, TEE and Ultrasound Guidance Line Placement  Intra-op Plan:   Post-operative Plan: Extubation in OR  Informed Consent: I have reviewed the patients History and Physical, chart, labs and discussed the procedure including the risks, benefits and alternatives for the proposed anesthesia with the patient or authorized representative who has indicated his/her understanding and acceptance.     Dental advisory given  Plan Discussed with: CRNA  Anesthesia Plan Comments:          Anesthesia Quick Evaluation

## 2023-11-29 NOTE — Procedures (Signed)
 Extubation Procedure Note  Patient Details:   Name: Jasmin Gordon DOB: 1970-03-22 MRN: [id number]   Airway Documentation:    Vent end date: 11/29/23 Vent end time: 1630   Evaluation  O2 sats: stable throughout Complications: No apparent complications Patient did tolerate procedure well. Bilateral Breath Sounds: Clear, Diminished   Yes  John Williamsen 11/29/2023, 4:40 PM Pt extubated to 4L [name]. Pre extubation parameters NIF- -20x3 VC- .7-.9L x 3 Positive cuff leak

## 2023-11-29 NOTE — Interval H&P Note (Signed)
 History and Physical Interval Note:  11/29/2023 7:18 AM  Jasmin Gordon  has presented today for surgery, with the diagnosis of CAD.  The various methods of treatment have been discussed with the patient and family. After consideration of risks, benefits and other options for treatment, the patient has consented to  Procedure(s): CORONARY ARTERY BYPASS GRAFTING (CABG) (N/A) SURGICAL PROCUREMENT, ARTERY, RADIAL (Left) ECHOCARDIOGRAM, TRANSESOPHAGEAL, INTRAOPERATIVE (N/A) as a surgical intervention.  The patient's history has been reviewed, patient examined, no change in status, stable for surgery.  I have reviewed the patient's chart and labs.  Questions were answered to the patient's satisfaction.     [name]

## 2023-11-29 NOTE — Plan of Care (Signed)
  Problem: Education: Goal: Ability to describe self-care measures that may prevent or decrease complications (Diabetes Survival Skills Education) will improve Outcome: Progressing   Problem: Fluid Volume: Goal: Ability to maintain a balanced intake and output will improve Outcome: Progressing   Problem: Metabolic: Goal: Ability to maintain appropriate glucose levels will improve Outcome: Progressing   Problem: Tissue Perfusion: Goal: Adequacy of tissue perfusion will improve Outcome: Progressing   

## 2023-11-29 NOTE — Brief Op Note (Signed)
 11/26/2023 - 11/29/2023  7:56 AM  PATIENT:  Jasmin Gordon  53 y.o. female  PRE-OPERATIVE DIAGNOSIS:  coronary artery disease  POST-OPERATIVE DIAGNOSIS:  coronary artery disease  PROCEDURE:  Procedure(s):  CORONARY ARTERY BYPASS GRAFTING x 2 -LIMA to LAD -LEFT RADIAL ARTERY TO DIAGONAL  SURGICAL PROCUREMENT, ARTERY, RADIAL (Left) Harvest Time:35 min     Prep Time: 15  ECHOCARDIOGRAM, TRANSESOPHAGEAL, INTRAOPERATIVE (N/A)   SURGEON:  Surgeons and Role:    * Kerrin Elspeth BROCKS, MD - Primary  PHYSICIAN ASSISTANT: Rocky Shad PA-C   ASSISTANTS: Suzen Stacks RNFA   ANESTHESIA:   general  EBL:  1110 mL   BLOOD ADMINISTERED:  CC CELLSAVER  DRAINS: Left Pleural Chest Tube, Mediastinal Chest Drains   LOCAL MEDICATIONS USED:  NONE  SPECIMEN:  No Specimen  DISPOSITION OF SPECIMEN:  N/A  COUNTS:  YES  TOURNIQUET:  * No tourniquets in log *  DICTATION: .Dragon Dictation  PLAN OF CARE: Admit to inpatient   PATIENT DISPOSITION:  ICU - intubated and hemodynamically stable.   Delay start of Pharmacological VTE agent (>24hrs) due to surgical blood loss or risk of bleeding: yes

## 2023-11-29 NOTE — Progress Notes (Signed)
 Patient post extubation is now expressing openness to family visitors (no longer wishing for it to be restricted only to her sister). Password in system.

## 2023-11-29 NOTE — OR Nursing (Signed)
 Patient stated that she would like all communication to be made to sister Dene Shape during October 2025 treatment post coronary artery bypass grafting by Dr. Elspeth Millers.  She has also stated that she would like to only have her sister, Dene Shape as her only visitor post coronary artery bypass grafting for October 2025 treatment.

## 2023-11-29 NOTE — Anesthesia Procedure Notes (Signed)
 Arterial Line Insertion Start/End10/29/2025 7:40 AM, 11/29/2023 7:45 AM Performed by: Jasmin Franky BIRCH, MD  Patient location: Pre-op. Preanesthetic checklist: patient identified, IV checked, site marked, risks and benefits discussed, surgical consent, monitors and equipment checked, pre-op evaluation, timeout performed and anesthesia consent Lidocaine  1% used for infiltration Right, radial was placed Catheter size: 20 G Hand hygiene performed  and maximum sterile barriers used   Attempts: 3 Following insertion, dressing applied and Biopatch. Post procedure assessment: normal and unchanged  Post procedure complications: second provider assisted. Patient tolerated the procedure well with no immediate complications. Additional procedure comments: 2 attempts by CRNA, unable to advance wire. [name]

## 2023-11-29 NOTE — Anesthesia Procedure Notes (Signed)
 Central Venous Catheter Insertion Performed by: Tilford Franky BIRCH, MD, anesthesiologist Start/End10/29/2025 7:35 AM, 11/29/2023 7:40 AM Patient location: Pre-op. Preanesthetic checklist: patient identified, IV checked, site marked, risks and benefits discussed, surgical consent, monitors and equipment checked, pre-op evaluation, timeout performed and anesthesia consent Hand hygiene performed  and maximum sterile barriers used  PA cath was placed.Swan type:thermodilution Attempts: 1 Patient tolerated the procedure well with no immediate complications.

## 2023-11-29 NOTE — Anesthesia Postprocedure Evaluation (Signed)
 Anesthesia Post Note  Patient: Jasmin Gordon [facility]  Procedure(s) Performed: CORONARY ARTERY BYPASS GRAFTING (CABG) TIMES TWO USING LEFT RADIAL ARTERY AND LEFT INTERNAL MAMMARY ARTERY (Chest) SURGICAL PROCUREMENT, ARTERY, RADIAL (Left) ECHOCARDIOGRAM, TRANSESOPHAGEAL, INTRAOPERATIVE     Patient location during evaluation: SICU Anesthesia Type: General Level of consciousness: sedated Pain management: pain level controlled Vital Signs Assessment: post-procedure vital signs reviewed and stable Respiratory status: patient remains intubated per anesthesia plan Cardiovascular status: stable Postop Assessment: no apparent nausea or vomiting Anesthetic complications: no   No notable events documented.  Last Vitals:  Vitals:   11/29/23 1500 11/29/23 1517  BP: 99/77 103/83  Pulse: (!) 101 (!) 101  Resp:    Temp: 37.2 C 37.4 C  SpO2: 98% 98%    Last Pain:  Vitals:   11/29/23 0648  TempSrc:   PainSc: 0-No pain                 Jasmin Gordon

## 2023-11-29 NOTE — Progress Notes (Signed)
 [facility]                                                                               Jasmin Gordon, is a 53 y.o. female, DOB - 12/27/1970, FMW:[id number] Admit date - 11/26/2023    Outpatient Primary MD for the patient is [name], MD  LOS - 3  days    Brief summary    53 y.o. female with hx of CAD, recent onset of anginal symptoms ~ 1 month ago, with recent coronary CT demonstrating a hemodynamically significant proximal LAD lesion plan for upcoming cardiac catheterization outpatient, additional history including morbid obesity, diabetes type 2, hypertension, hyperlipidemia, OSA, GERD, who presented with worsening chest pain.  She underwent cardiac cath showing severe stenosis of proximal LAD and large caliber diagonal branch. It was felt PCI would compromise flow into diagonal and potentially meet 2 layers of stent. CT surgery was consulted for CABG evaluation. She was scheduled for CABG today and is off the unit.    Assessment & Plan    Assessment and Plan:   Estimated body mass index is 42.73 kg/m as calculated from the following:   Height as of this encounter: 5' 2 (1.575 m).   Weight as of this encounter: 106 kg.  Code Status: full code.  DVT Prophylaxis:  SCDs Start: 11/29/23 1253   Level of Care: Level of care: ICU  Antimicrobials:   Anti-infectives (From admission, onward)    Start     Dose/Rate Route Frequency Ordered Stop   11/29/23 1945  vancomycin (VANCOCIN) IVPB 1000 mg/200 mL premix        1,500 mg 300 mL/hr over 60 Minutes Intravenous  Once 11/29/23 1252     11/29/23 1345  ceFAZolin  (ANCEF ) IVPB 2g/100 mL premix        2 g 200 mL/hr over 30 Minutes Intravenous Every 8 hours 11/29/23 1252 12/01/23 1359   11/29/23 0400  Vancomycin (VANCOCIN) 1,500 mg in sodium chloride  0.9 % 500 mL IVPB        1,500 mg 250 mL/hr over 120 Minutes Intravenous To Surgery 11/28/23 1347 11/29/23 1255   11/29/23 0400  ceFAZolin  (ANCEF ) IVPB 2g/100 mL  premix        2 g 200 mL/hr over 30 Minutes Intravenous To Surgery 11/28/23 1347 11/29/23 1215   11/29/23 0400  ceFAZolin  (ANCEF ) IVPB 2g/100 mL premix  Status:  Discontinued        2 g 200 mL/hr over 30 Minutes Intravenous To Surgery 11/28/23 1347 11/29/23 1252        Medications  Scheduled Meds:  [START ON 11/30/2023] acetaminophen   1,000 mg Oral Q6H   Or   [START ON 11/30/2023] acetaminophen  (TYLENOL ) oral liquid 160 mg/5 mL  1,000 mg Per Tube Q6H   acetaminophen  (TYLENOL ) oral liquid 160 mg/5 mL  650 mg Per Tube Once   aspirin  324 mg Oral Once   [START ON 11/30/2023] aspirin EC  325 mg Oral Daily   Or   [START ON 11/30/2023] aspirin  324 mg Per Tube Daily   [START ON 11/30/2023] bisacodyl  10 mg Oral Daily   Or   [START ON 11/30/2023] bisacodyl  10 mg Rectal Daily   [START ON 11/30/2023] docusate sodium   200 mg Oral Daily   metoCLOPramide (REGLAN) injection  10 mg Intravenous Q6H   metoprolol  tartrate  12.5 mg Oral BID   Or   metoprolol  tartrate  12.5 mg Per Tube BID   mupirocin ointment  1 Application Nasal BID   [START ON 12/01/2023] pantoprazole   40 mg Oral Daily   pantoprazole  (PROTONIX ) IV  40 mg Intravenous QHS   rosuvastatin  40 mg Oral Daily   [START ON 11/30/2023] sodium chloride  flush  3 mL Intravenous Q12H   Continuous Infusions:  sodium chloride  Stopped (11/29/23 1352)   [START ON 11/30/2023] sodium chloride      sodium chloride  10 mL/hr at 11/29/23 1500   albumin human      ceFAZolin  (ANCEF ) IV Stopped (11/29/23 1343)   dexmedetomidine (PRECEDEX) IV infusion 0.7 mcg/kg/hr (11/29/23 1500)   insulin 3 Units/hr (11/29/23 1500)   lactated ringers      lactated ringers  20 mL/hr at 11/29/23 1500   magnesium  sulfate 20 mL/hr at 11/29/23 1500   nitroGLYCERIN 20 mcg/min (11/29/23 1500)   phenylephrine  (NEO-SYNEPHRINE) Adult infusion 20 mcg/min (11/29/23 1500)   potassium chloride      vancomycin     PRN Meds:.sodium chloride , albumin human, albuterol,  dextrose , famotidine, melatonin, metoprolol  tartrate, midazolam  PF, morphine injection, ondansetron  (ZOFRAN ) IV, oxyCODONE , [START ON 11/30/2023] sodium chloride  flush, traMADol       Objective:   Vitals:   11/29/23 1430 11/29/23 1445 11/29/23 1500 11/29/23 1517  BP:   99/77 103/83  Pulse: 97 99 (!) 101 (!) 101  Resp:      Temp: 98.8 F (37.1 C) 99 F (37.2 C) 99 F (37.2 C) 99.3 F (37.4 C)  TempSrc:      SpO2: 96% 97% 98% 98%  Weight:      Height:        Intake/Output Summary (Last 24 hours) at 11/29/2023 1539 Last data filed at 11/29/2023 1514 Gross per 24 hour  Intake 4276.53 ml  Output 2085 ml  Net 2191.53 ml   Filed Weights   11/27/23 0005 11/28/23 0615 11/29/23 0452  Weight: 109.4 kg 106.6 kg 106 kg     Data Reviewed:  I have personally reviewed following labs and imaging studies   CBC Lab Results  Component Value Date   WBC 25.7 (H) 11/29/2023   RBC 3.92 11/29/2023   HGB 11.4 (L) 11/29/2023   HCT 33.7 (L) 11/29/2023   MCV 86.0 11/29/2023   MCH 29.1 11/29/2023   PLT 268 11/29/2023   MCHC 33.8 11/29/2023   RDW 12.5 11/29/2023   LYMPHSABS 3.5 (H) 06/05/2012   MONOABS 1.1 (H) 08/07/2011   EOSABS 0.7 (H) 06/05/2012   BASOSABS 0.0 06/05/2012     Last metabolic panel Lab Results  Component Value Date   NA 135 11/29/2023   K 4.2 11/29/2023   CL 101 11/29/2023   CO2 20 (L) 11/29/2023   BUN 11 11/29/2023   CREATININE 0.60 11/29/2023   GLUCOSE 140 (H) 11/29/2023   GFRNONAA >60 11/29/2023   GFRAA 127 06/05/2012   CALCIUM 9.5 11/29/2023   PHOS 3.7 11/27/2023   PROT 7.3 06/05/2012   ALBUMIN 4.3 06/05/2012   LABGLOB 3.0 06/05/2012   AGRATIO 1.4 06/05/2012   BILITOT 0.3 06/05/2012   ALKPHOS 80 06/05/2012   AST 24 06/05/2012   ALT 30 06/05/2012   ANIONGAP 11 11/29/2023    CBG (last 3)  Recent Labs    11/29/23 1304  11/29/23 1410 11/29/23 1521  GLUCAP 172* 136* 158*      Coagulation Profile: Recent Labs  Lab 11/28/23 1424  11/29/23 1309  INR 1.0 1.2     Radiology Studies: [facility] Chest Port 1 View Result Date: 11/29/2023 CLINICAL DATA:  Status post CABG. EXAM: PORTABLE CHEST 1 VIEW COMPARISON:  November 26, 2023 FINDINGS: An endotracheal tube is seen with its distal tip approximately 1.6 cm from the carina. A nasogastric tube is present with its distal end looped within the body of the stomach. There is a right internal jugular Swan-Ganz catheter with its distal tip seen along the midline at the level of the hilum. Multiple sternal wires and vascular clips are noted. A left-sided chest tube is in place. The cardiac silhouette is mildly enlarged. There is mild prominence of the central pulmonary vasculature with mild bilateral suprahilar and left infrahilar atelectasis. No pleural effusion or pneumothorax is identified. The visualized skeletal structures are unremarkable. IMPRESSION: 1. Endotracheal tube, nasogastric tube, left-sided chest tube and right internal jugular Swan-Ganz catheter positioning, as described above. 2. Status post CABG with mild cardiomegaly with mild central pulmonary vascular congestion. 3. Mild bilateral suprahilar and left infrahilar atelectasis. Electronically Signed   By: Suzen Dials M.D.   On: 11/29/2023 14:34   VAS US  DOPPLER PRE CABG Result Date: 11/28/2023 PREOPERATIVE VASCULAR EVALUATION Patient Name:  GIRLIE VELTRI [facility]  Date of Exam:   11/28/2023 Medical Rec #: [id number]      Accession #:    [id number] Date of Birth: [birth year]      Patient Gender: F Patient Age:   40 years Exam Location:  [facility] Procedure:      VAS US  DOPPLER PRE CABG Referring Phys: ELSPETH HENDRICKSON --------------------------------------------------------------------------------  Indications:   Pre-CABG. Risk Factors:  Hypertension, hyperlipidemia, Diabetes, coronary artery disease. Other Factors: Morbid obesity, GERD, OSA. Performing Technologist: Delon Fetch  Examination Guidelines: A complete evaluation  includes B-mode imaging, spectral Doppler, color Doppler, and power Doppler as needed of all accessible portions of each vessel. Bilateral testing is considered an integral part of a complete examination. Limited examinations for reoccurring indications may be performed as noted.  Right Carotid Findings: +----------+--------+--------+--------+--------+------------------+           PSV cm/sEDV cm/sStenosisDescribeComments           +----------+--------+--------+--------+--------+------------------+ CCA Prox  76      21                                         +----------+--------+--------+--------+--------+------------------+ CCA Distal75      25                      intimal thickening +----------+--------+--------+--------+--------+------------------+ ICA Prox  58      27      1-39%           intimal thickening +----------+--------+--------+--------+--------+------------------+ ICA Distal86      38                                         +----------+--------+--------+--------+--------+------------------+ ECA       93      13                                         +----------+--------+--------+--------+--------+------------------+ +----------+--------+-------+----------------+------------+  PSV cm/sEDV cmsDescribe        Arm Pressure +----------+--------+-------+----------------+------------+ Subclavian73             Multiphasic, WNL             +----------+--------+-------+----------------+------------+ +---------+--------+--+--------+--+---------+ VertebralPSV cm/s34EDV cm/s14Antegrade +---------+--------+--+--------+--+---------+ Left Carotid Findings: +----------+--------+--------+--------+----------+------------------+           PSV cm/sEDV cm/sStenosisDescribe  Comments           +----------+--------+--------+--------+----------+------------------+ CCA Prox  92      18                                            +----------+--------+--------+--------+----------+------------------+ CCA Distal87      22                        intimal thickening +----------+--------+--------+--------+----------+------------------+ ICA Prox  44      20      1-39%   hypoechoic                   +----------+--------+--------+--------+----------+------------------+ ICA Distal73      37                                           +----------+--------+--------+--------+----------+------------------+ ECA       80      19                                           +----------+--------+--------+--------+----------+------------------+ +----------+--------+--------+----------------+------------+ SubclavianPSV cm/sEDV cm/sDescribe        Arm Pressure +----------+--------+--------+----------------+------------+           125             Multiphasic, WNL             +----------+--------+--------+----------------+------------+ +---------+--------+--+--------+--+---------+ VertebralPSV cm/s57EDV cm/s28Antegrade +---------+--------+--+--------+--+---------+  ABI Findings: +---------+ Waveform  +---------+ triphasic +---------+ triphasic +---------+ triphasic +---------+ +---------+ Waveform  +---------+ triphasic +---------+ triphasic +---------+ triphasic +---------+  Right Doppler Findings: +--------+---------+ Site    Doppler   +--------+---------+ Brachialtriphasic +--------+---------+ Radial  triphasic +--------+---------+ Ulnar   triphasic +--------+---------+  Left Doppler Findings: +--------+---------+ Site    Doppler   +--------+---------+ Brachialtriphasic +--------+---------+ Radial  triphasic +--------+---------+ Ulnar   triphasic +--------+---------+   Summary: Right Carotid: Velocities in the right ICA are consistent with a 1-39% stenosis. Left Carotid: Velocities in the left ICA are consistent with a 1-39% stenosis. Vertebrals:  Bilateral vertebral arteries  demonstrate antegrade flow. Subclavians: Normal flow hemodynamics were seen in bilateral subclavian              arteries. Right ABI: Normal triphasic waveforms. Left ABI: Normal triphasic waveforms. Right Upper Extremity: Doppler waveforms remain within normal limits with right radial compression. Doppler waveforms decrease >50% with right ulnar compression. Left Upper Extremity: Doppler waveforms remain within normal limits with left radial compression. Doppler waveforms remain within normal limits with left ulnar compression.  Electronically signed by Debby Robertson on 11/28/2023 at 4:38:51 PM.    Final        Elgie Butter M.D. [facility] 11/29/2023, 3:39 PM  Available via Epic secure chat 7am-7pm After 7 pm, please refer to night coverage provider listed  on amion.

## 2023-11-29 NOTE — Progress Notes (Signed)
Rapid wean started at this time. 

## 2023-11-29 NOTE — Progress Notes (Signed)
 Rapid wean continued

## 2023-11-29 NOTE — Progress Notes (Signed)
 Per Evalene Domino, RN     Signed      Patient stated that she would like all communication to be made to sister Dene Shape during October 2025 treatment post coronary artery bypass grafting by Dr. Elspeth Millers.   She has also stated that she would like to only have her sister, Dene Shape as her only visitor post coronary artery bypass grafting for October 2025 treatment.

## 2023-11-29 NOTE — Anesthesia Procedure Notes (Signed)
 Central Venous Catheter Insertion Performed by: Tilford Franky BIRCH, MD, anesthesiologist Start/End10/29/2025 7:25 AM, 11/29/2023 7:35 AM Patient location: Pre-op. Preanesthetic checklist: patient identified, IV checked, site marked, risks and benefits discussed, surgical consent, monitors and equipment checked, pre-op evaluation, timeout performed and anesthesia consent Position: Trendelenburg Lidocaine  1% used for infiltration and patient sedated Hand hygiene performed , maximum sterile barriers used  and Seldinger technique used Catheter size: 8.5 Fr Total catheter length 10. Central line was placed.Sheath introducer Swan type:thermodilution PA Cath depth:50 Procedure performed using ultrasound to evaluate access site. Ultrasound Notes:relevant anatomy identified, ultrasound used to visualize needle entry, vessel patent under ultrasound and image(s) printed for medical record. Attempts: 1 Following insertion, line sutured and dressing applied. Post procedure assessment: blood return through all ports, free fluid flow and no air  Patient tolerated the procedure well with no immediate complications.

## 2023-11-30 ENCOUNTER — Encounter (HOSPITAL_COMMUNITY): Payer: Self-pay | Admitting: Thoracic Surgery (Cardiothoracic Vascular Surgery)

## 2023-11-30 ENCOUNTER — Inpatient Hospital Stay (HOSPITAL_COMMUNITY)

## 2023-11-30 DIAGNOSIS — I200 Unstable angina: Secondary | ICD-10-CM | POA: Diagnosis not present

## 2023-11-30 DIAGNOSIS — J9811 Atelectasis: Secondary | ICD-10-CM

## 2023-11-30 LAB — BASIC METABOLIC PANEL WITH GFR
Anion gap: 11 (ref 5–15)
Anion gap: 9 (ref 5–15)
BUN: 10 mg/dL (ref 6–20)
BUN: 10 mg/dL (ref 6–20)
CO2: 22 mmol/L (ref 22–32)
CO2: 24 mmol/L (ref 22–32)
Calcium: 7.9 mg/dL — ABNORMAL LOW (ref 8.9–10.3)
Calcium: 7.9 mg/dL — ABNORMAL LOW (ref 8.9–10.3)
Chloride: 103 mmol/L (ref 98–111)
Chloride: 102 mmol/L (ref 98–111)
Creatinine, Ser: 0.54 mg/dL (ref 0.44–1.00)
Creatinine, Ser: 0.79 mg/dL (ref 0.44–1.00)
GFR, Estimated: >60 mL/min (ref >60)
GFR, Estimated: >60 mL/min (ref >60)
Glucose, Bld: 130 mg/dL — ABNORMAL HIGH (ref 70–99)
Glucose, Bld: 166 mg/dL — ABNORMAL HIGH (ref 70–99)
Potassium: 3.7 mmol/L (ref 3.5–5.1)
Potassium: 4.3 mmol/L (ref 3.5–5.1)
Sodium: 136 mmol/L (ref 135–145)
Sodium: 135 mmol/L (ref 135–145)

## 2023-11-30 LAB — GLUCOSE, CAPILLARY
Glucose-Capillary: 140 mg/dL — ABNORMAL HIGH (ref 70–99)
Glucose-Capillary: 130 mg/dL — ABNORMAL HIGH (ref 70–99)
Glucose-Capillary: 121 mg/dL — ABNORMAL HIGH (ref 70–99)
Glucose-Capillary: 147 mg/dL — ABNORMAL HIGH (ref 70–99)
Glucose-Capillary: 136 mg/dL — ABNORMAL HIGH (ref 70–99)
Glucose-Capillary: 116 mg/dL — ABNORMAL HIGH (ref 70–99)
Glucose-Capillary: 169 mg/dL — ABNORMAL HIGH (ref 70–99)
Glucose-Capillary: 154 mg/dL — ABNORMAL HIGH (ref 70–99)

## 2023-11-30 LAB — CBC
HCT: 34.1 % — ABNORMAL LOW (ref 36.0–46.0)
HCT: 35.3 % — ABNORMAL LOW (ref 36.0–46.0)
Hemoglobin: 11.5 g/dL — ABNORMAL LOW (ref 12.0–15.0)
Hemoglobin: 11.6 g/dL — ABNORMAL LOW (ref 12.0–15.0)
MCH: 29 pg (ref 26.0–34.0)
MCH: 29 pg (ref 26.0–34.0)
MCHC: 33.7 g/dL (ref 30.0–36.0)
MCHC: 32.9 g/dL (ref 30.0–36.0)
MCV: 86.1 fL (ref 80.0–100.0)
MCV: 88.3 fL (ref 80.0–100.0)
Platelets: 271 K/uL (ref 150–400)
Platelets: 295 K/uL (ref 150–400)
RBC: 3.96 MIL/uL (ref 3.87–5.11)
RBC: 4 MIL/uL (ref 3.87–5.11)
RDW: 12.6 % (ref 11.5–15.5)
RDW: 12.8 % (ref 11.5–15.5)
WBC: 19.7 K/uL — ABNORMAL HIGH (ref 4.0–10.5)
WBC: 19.9 K/uL — ABNORMAL HIGH (ref 4.0–10.5)
nRBC: 0 % (ref 0.0–0.2)
nRBC: 0 % (ref 0.0–0.2)

## 2023-11-30 LAB — MAGNESIUM
Magnesium: 2.3 mg/dL (ref 1.7–2.4)
Magnesium: 2 mg/dL (ref 1.7–2.4)

## 2023-11-30 MED ORDER — FENTANYL CITRATE (PF) 50 MCG/ML IJ SOSY
25.0000 ug | PREFILLED_SYRINGE | Freq: Once | INTRAMUSCULAR | Status: AC
  Administered 2023-11-30: 25 ug via INTRAVENOUS
  Filled 2023-11-30: qty 1

## 2023-11-30 MED ORDER — INSULIN GLARGINE-YFGN 100 UNIT/ML ~~LOC~~ SOLN
35.0000 [IU] | Freq: Every day | SUBCUTANEOUS | Status: DC
  Administered 2023-11-30 – 2023-12-03 (×4): 35 [IU] via SUBCUTANEOUS
  Filled 2023-11-30 (×4): qty 0.35

## 2023-11-30 MED ORDER — PROPOFOL 500 MG/50ML IV EMUL
INTRAVENOUS | Status: AC
  Filled 2023-11-30: qty 200

## 2023-11-30 MED ORDER — FUROSEMIDE 10 MG/ML IJ SOLN
20.0000 mg | Freq: Two times a day (BID) | INTRAMUSCULAR | Status: DC
  Administered 2023-11-30 – 2023-12-01 (×3): 20 mg via INTRAVENOUS
  Filled 2023-11-30 (×3): qty 2

## 2023-11-30 MED ORDER — INSULIN ASPART 100 UNIT/ML IJ SOLN
4.0000 [IU] | Freq: Three times a day (TID) | INTRAMUSCULAR | Status: DC
  Administered 2023-11-30 – 2023-12-02 (×5): 4 [IU] via SUBCUTANEOUS

## 2023-11-30 MED ORDER — POTASSIUM CHLORIDE 10 MEQ/50ML IV SOLN
10.0000 meq | INTRAVENOUS | Status: AC
  Administered 2023-11-30 (×3): 10 meq via INTRAVENOUS
  Filled 2023-11-30: qty 50

## 2023-11-30 MED ORDER — ORAL CARE MOUTH RINSE
15.0000 mL | OROMUCOSAL | Status: DC | PRN
Start: 2023-11-30 — End: 2023-12-03

## 2023-11-30 MED ORDER — INSULIN ASPART 100 UNIT/ML IJ SOLN
0.0000 [IU] | INTRAMUSCULAR | Status: DC
  Administered 2023-11-30: 2 [IU] via SUBCUTANEOUS
  Administered 2023-11-30: 4 [IU] via SUBCUTANEOUS
  Administered 2023-11-30 – 2023-12-01 (×2): 2 [IU] via SUBCUTANEOUS
  Administered 2023-12-01: 4 [IU] via SUBCUTANEOUS

## 2023-11-30 MED ORDER — POTASSIUM CHLORIDE 20 MEQ PO PACK
20.0000 meq | PACK | ORAL | Status: AC
  Administered 2023-11-30 (×2): 20 meq via ORAL
  Filled 2023-11-30 (×2): qty 1

## 2023-11-30 MED ORDER — AMLODIPINE BESYLATE 5 MG PO TABS
5.0000 mg | ORAL_TABLET | Freq: Every day | ORAL | Status: DC
  Administered 2023-11-30: 5 mg via ORAL
  Filled 2023-11-30: qty 1

## 2023-11-30 MED ORDER — ENOXAPARIN SODIUM 40 MG/0.4ML IJ SOSY
40.0000 mg | PREFILLED_SYRINGE | Freq: Every day | INTRAMUSCULAR | Status: DC
  Administered 2023-11-30 – 2023-12-02 (×3): 40 mg via SUBCUTANEOUS
  Filled 2023-11-30 (×3): qty 0.4

## 2023-11-30 MED FILL — Magnesium Sulfate Inj 50%: INTRAMUSCULAR | Qty: 10 | Status: AC

## 2023-11-30 MED FILL — Heparin Sodium (Porcine) Inj 1000 Unit/ML: Qty: 1000 | Status: AC

## 2023-11-30 MED FILL — Potassium Chloride Inj 2 mEq/ML: INTRAVENOUS | Qty: 40 | Status: AC

## 2023-11-30 NOTE — Op Note (Signed)
 NAMEARYANNE, GILLELAND MEDICAL RECORD NO: [id number] ACCOUNT NO: [id number] DATE OF BIRTH: 09-Feb-1970 FACILITY: [facility] LOCATION: [facility] PHYSICIAN: Elspeth BROCKS. Kerrin, MD  Operative Report   DATE OF PROCEDURE: 11/29/2023  PREOPERATIVE DIAGNOSIS: Severe single-vessel coronary artery disease.  POSTOPERATIVE DIAGNOSIS: Severe single-vessel coronary artery disease.  PROCEDURE:  Median sternotomy, extracorporeal circulation,  Coronary artery bypass grafting x2  Left internal mammary artery to LAD,  Left radial artery to first diagonal.  SURGEON: Elspeth BROCKS. Kerrin, MD  ASSISTANT: Rocky Shad, PA.   Experienced assistant was necessary for this case due to surgical complexity. Erin Barrett independently harvested the radial artery and closed the arm incision. She then assisted with exposure, retraction of delicate tissues, suctioning, and suture management during the anastomosis.  ANESTHESIA: General.  FINDINGS: Good quality conduits, good quality targets. Transesophageal echocardiography revealed preserved left ventricular function with no significant valvular pathology, unchanged post bypass.  CLINICAL NOTE: Ms. Simmon is a 53 year old woman who presented with chest pain. Workup revealed 90% proximal LAD stenosis as well as a 70% stenosis at the ostium of the large first diagonal branch. She was not felt to be a good percutaneous intervention candidate because of the diagonal disease. She was referred for coronary artery bypass grafting. The indications, risks, benefits, and alternatives were discussed in detail with the patient. She understood and accepted the risks and agreed to proceed.  OPERATIVE NOTE: Ms. Otani was brought to the operating room on 11/29/2023. She had induction of general anesthesia and was intubated. Intravenous antibiotics were administered. A Foley catheter was placed. The chest, abdomen, and legs were prepped and  draped in the usual sterile fashion. Dr. Franky Bald of anesthesiology performed transesophageal echocardiography. Please see his separately dictated note for full details of the procedure.  A timeout was performed. A median sternotomy was performed and the left internal mammary artery was harvested using standard technique.  The mammary harvest was relatively difficult due to body habitus, but the mammary artery was a good quality vessel with excellent flow when divided distally. Simultaneously with the mammary artery harvest, an incision was made over the volar aspect of the left wrist. It was carried through the skin and subcutaneous tissue. A short segment of the radial artery was mobilized and there was a good pulse ox in the index finger and a good Doppler signal in the palmar arch with radial occlusion. This confirmed the findings on the preoperative assessment. The incision was extended just below the antecubital fossa and the radial artery was harvested using the Harmonic scalpel. 2000 units of heparin  was administered during the vessel harvest. The remainder of the full heparin  dose was given prior to opening the pericardium.  After harvesting the conduits, the sternal retractor was placed. The pericardium was opened. The ascending aorta was normal caliber with no evidence of atherosclerotic disease. After confirming adequate anticoagulation with ACT measurement, the aorta was cannulated via concentric 2-0 Ethibond pledgeted pursestring sutures. A dual-stage venous cannula was placed via a pursestring suture in the right atrial appendage. Cardiopulmonary bypass was initiated. Flows were maintained per protocol. The patient was cooled to 34 degrees Celsius. The coronary arteries were inspected and anastomotic sites were chosen. The conduits were inspected and cut to length and prepared. A foam pad was placed in the pericardium to insulate the heart. A temperature probe was placed in the myocardial septum and a cardioplegia cannula was placed in the  ascending aorta.  The aorta was cross-clamped. The left ventricle was emptied via the  aortic root vent. Cardiac arrest then was achieved with a combination of cold antegrade blood cardioplegia and topical ice saline. One liter of cardioplegia was administered. There was a rapid diastolic arrest. There was septal cooling to 12 degrees Celsius.  The left radial artery then was anastomosed end-to-side to the first diagonal branch of the LAD. This was a 1.5 mm good quality target and 1.5 mm good quality conduit. The end-to-side anastomosis was performed with a running 8-0 Prolene suture. A probe passed easily proximally and distally.  The left internal mammary artery was brought through a window in the pericardium. The distal end was beveled. It was anastomosed end-to-side to the distal LAD. The mammary and LAD were both 2 mm good quality vessels. This anastomosis was also performed  end-to-side with a running 8-0 Prolene suture. At the completion of the mammary to LAD anastomosis, the bulldog clamp was briefly removed to inspect for hemostasis. Rapid septal rewarming was noted. The bulldog clamp was replaced and the mammary pedicle was tacked to the epicardial surface of the heart with 6-0 Prolene sutures.  Additional cardioplegia was administered. The cardioplegia cannula then was removed from the ascending aorta. The radial artery was cut to length. The proximal end was beveled. It was anastomosed end-to-side to a 4.0-mm punch aortotomy with a running 7-0 Prolene suture. At the completion of the proximal anastomosis, the patient was placed in Trendelenburg position. Lidocaine  was administered. The aortic root was de-aired and the aortic cross clamp was removed. The total cross clamp time was 42 minutes. The patient required a single defibrillation with 10 joules and then was in sinus rhythm thereafter.  While rewarming was completed, all proximal and distal anastomoses were inspected for hemostasis.  Epicardial pacing wires were placed on the right ventricle and right atrium. When the patient had reached a core temperature of 37 degrees Celsius, she was  weaned from cardiopulmonary bypass on the first attempt without inotropic support. The total bypass time was 77 minutes. Post bypass transesophageal echocardiography was unchanged from the pre-bypass study.  A test dose of protamine was administered and was well tolerated. The atrial and aortic cannulae were removed. The remainder of protamine was administered without incident. The chest was irrigated with warm saline. Hemostasis was achieved. The pericardium was not closed. The left pleural and mediastinal chest tubes were placed through separate subcostal incisions. The sternum was closed with a combination of single and double-heavy gauge stainless steel wires. The pectoralis fascia and subcutaneous tissue and skin were closed in standard fashion. All sponge, needle, and instrument counts were correct at the end of the procedure. The patient was taken from the operating room to the Surgical Intensive Care unit, intubated in fair condition.       PAA D: 11/29/2023 5:32:21 pm T: 11/30/2023 3:03:00 am  JOB: [id number]/ [id number]

## 2023-11-30 NOTE — Progress Notes (Signed)
 1 Day Post-Op Procedure(s) (LRB): CORONARY ARTERY BYPASS GRAFTING (CABG) TIMES TWO USING LEFT RADIAL ARTERY AND LEFT INTERNAL MAMMARY ARTERY (N/A) SURGICAL PROCUREMENT, ARTERY, RADIAL (Left) ECHOCARDIOGRAM, TRANSESOPHAGEAL, INTRAOPERATIVE (N/A) Subjective: Some incisional pain  Objective: Vital signs in last 24 hours: Temp:  [98.1 F (36.7 C)-99.9 F (37.7 C)] 98.8 F (37.1 C) (10/30 0830) Pulse Rate:  [80-106] 89 (10/30 0830) Cardiac Rhythm: Normal sinus rhythm (10/30 0745) BP: (84-132)/(58-91) 122/77 (10/30 0800) SpO2:  [90 %-99 %] 94 % (10/30 0830) Arterial Line BP: (86-149)/(53-99) 149/79 (10/30 0747) FiO2 (%):  [40 %-50 %] 40 % (10/29 1540) Weight:  [110 kg] 110 kg (10/30 0500)  Hemodynamic parameters for last 24 hours: PAP: (14-40)/(4-28) 23/14 CO:  [1.9 L/min-6.1 L/min] 6.1 L/min CI:  [2 L/min/m2-4 L/min/m2] 2.99 L/min/m2  Intake/Output from previous day: 10/29 0701 - 10/30 0700 In: 5110.2 [I.V.:2493.2; Blood:740; IV Piggyback:1877] Out: 3107 [Urine:1551; Blood:1110; Chest Tube:446] Intake/Output this shift: No intake/output data recorded.  General appearance: alert, cooperative, and no distress Neurologic: intact Heart: regular rate and rhythm Lungs: diminished breath sounds bibasilar Abdomen: normal findings: soft, non-tender  Lab Results: Recent Labs    11/29/23 1832 11/30/23 0415  WBC 23.0* 19.7*  HGB 12.8 11.5*  HCT 37.2 34.1*  PLT 310 271   BMET:  Recent Labs    11/29/23 1832 11/30/23 0415  NA 138 136  K 4.2 3.7  CL 105 103  CO2 22 22  GLUCOSE 137* 130*  BUN 10 10  CREATININE 0.62 0.54  CALCIUM 8.2* 7.9*    PT/INR:  Recent Labs    11/29/23 1309  LABPROT 16.0*  INR 1.2   ABG    Component Value Date/Time   PHART 7.378 11/29/2023 1754   HCO3 23.4 11/29/2023 1754   TCO2 25 11/29/2023 1754   ACIDBASEDEF 1.0 11/29/2023 1754   O2SAT 96 11/29/2023 1754   CBG (last 3)  Recent Labs    11/30/23 0412 11/30/23 0620 11/30/23 0820   GLUCAP 130* 121* 147*    Assessment/Plan: S/P Procedure(s) (LRB): CORONARY ARTERY BYPASS GRAFTING (CABG) TIMES TWO USING LEFT RADIAL ARTERY AND LEFT INTERNAL MAMMARY ARTERY (N/A) SURGICAL PROCUREMENT, ARTERY, RADIAL (Left) ECHOCARDIOGRAM, TRANSESOPHAGEAL, INTRAOPERATIVE (N/A) NEURO- intact CV- in Sr with good hemodynamics  ASA, statin, beta blocker  Dc Swan and A line and pacing wires  Norvasc  for radial graft RESP_ IS for atelectasis RENAL- creatinine normal  Diurese, supplement K ENDO- CBG controlled with insulin drip  Transition to Sem Glee + meal coverage with SSI GI- diet as tolerated SCD + enoxaparin  Dc chest tubes Cardiac rehab   LOS: 4 days    Jasmin Gordon Millers 11/30/2023

## 2023-11-30 NOTE — Progress Notes (Signed)
 EVENING ROUNDS NOTE :     [address]       [place] [id number]             [phone]                 1 Day Post-Op Procedure(s) (LRB): CORONARY ARTERY BYPASS GRAFTING (CABG) TIMES TWO USING LEFT RADIAL ARTERY AND LEFT INTERNAL MAMMARY ARTERY (N/A) SURGICAL PROCUREMENT, ARTERY, RADIAL (Left) ECHOCARDIOGRAM, TRANSESOPHAGEAL, INTRAOPERATIVE (N/A)   Total Length of Stay:  LOS: 4 days  Events:   No events  Stable day    BP 109/79 (BP Location: Right Arm)   Pulse 96   Temp 98 F (36.7 C) (Oral)   Resp 20   Ht 5' 2 (1.575 m)   Wt 110 kg   SpO2 96%   BMI 44.35 kg/m   PAP: (20-37)/(9-20) 23/14 CO:  [5.9 L/min-6.1 L/min] 6.1 L/min CI:  [2.9 L/min/m2-2.99 L/min/m2] 2.99 L/min/m2      sodium chloride      albumin human      ceFAZolin  (ANCEF ) IV 2 g (11/30/23 1546)   insulin 3.4 Units/hr (11/30/23 0955)   phenylephrine  (NEO-SYNEPHRINE) Adult infusion 20 mcg/min (11/30/23 0535)    I/O last 3 completed shifts: In: 5217.8 [I.V.:2583.1; Blood:740; IV Piggyback:1894.7] Out: 4152 [Urine:2596; Blood:1110; Chest Tube:446]      Latest Ref Rng & Units 11/30/2023    5:46 PM 11/30/2023    4:15 AM 11/29/2023    6:32 PM  CBC  WBC 4.0 - 10.5 K/uL 19.9  19.7  23.0   Hemoglobin 12.0 - 15.0 g/dL 88.3  88.4  87.1   Hematocrit 36.0 - 46.0 % 35.3  34.1  37.2   Platelets 150 - 400 K/uL 295  271  310        Latest Ref Rng & Units 11/30/2023    5:46 PM 11/30/2023    4:15 AM 11/29/2023    6:32 PM  BMP  Glucose 70 - 99 mg/dL 833  869  862   BUN 6 - 20 mg/dL 10  10  10    Creatinine 0.44 - 1.00 mg/dL 9.20  9.45  9.37   Sodium 135 - 145 mmol/L 135  136  138   Potassium 3.5 - 5.1 mmol/L 4.3  3.7  4.2   Chloride 98 - 111 mmol/L 102  103  105   CO2 22 - 32 mmol/L 24  22  22    Calcium 8.9 - 10.3 mg/dL 7.9  7.9  8.2     ABG    Component Value Date/Time   PHART 7.378 11/29/2023 1754   PCO2ART 39.9 11/29/2023 1754   PO2ART 84 11/29/2023 1754   HCO3 23.4  11/29/2023 1754   TCO2 25 11/29/2023 1754   ACIDBASEDEF 1.0 11/29/2023 1754   O2SAT 96 11/29/2023 1754       Jasmin Prichett, MD 11/30/2023 9:26 PM

## 2023-11-30 NOTE — Progress Notes (Signed)
  Progress Note  Patient Name: Jasmin Gordon Date of Encounter: 11/30/2023 [facility] Cardiologist: Alean SAUNDERS Madireddy, MD   Interval Summary    No events overnight. Chest wall pain this am  Vital Signs Vitals:   11/30/23 0630 11/30/23 0634 11/30/23 0645 11/30/23 0700  BP:      Pulse: 86 87 86 86  Resp:      Temp:   98.6 F (37 C) 98.6 F (37 C)  TempSrc:      SpO2: 94% 94% 94% 95%  Weight:      Height:        Intake/Output Summary (Last 24 hours) at 11/30/2023 0725 Last data filed at 11/30/2023 0655 Gross per 24 hour  Intake 5110.16 ml  Output 3077 ml  Net 2033.16 ml      11/30/2023    5:00 AM 11/29/2023    4:52 AM 11/28/2023    6:15 AM  Last 3 Weights  Weight (lbs) 242 lb 8.1 oz 233 lb 9.6 oz 235 lb  Weight (kg) 110 kg 105.96 kg 106.595 kg     Current Meds:   acetaminophen   1,000 mg Oral Q6H   Or   acetaminophen  (TYLENOL ) oral liquid 160 mg/5 mL  1,000 mg Oral Q6H   aspirin EC  325 mg Oral Daily   Or   aspirin  324 mg Oral Daily   bisacodyl  10 mg Oral Daily   Or   bisacodyl  10 mg Rectal Daily   Chlorhexidine Gluconate Cloth  6 each Topical Daily   docusate sodium   200 mg Oral Daily   metoCLOPramide (REGLAN) injection  10 mg Intravenous Q6H   metoprolol  tartrate  12.5 mg Oral BID   Or   metoprolol  tartrate  12.5 mg Oral BID   mupirocin ointment  1 Application Nasal BID   [START ON 12/01/2023] pantoprazole   40 mg Oral Daily   pantoprazole  (PROTONIX ) IV  40 mg Intravenous QHS   potassium chloride   20 mEq Oral Q4H   rosuvastatin  40 mg Oral Daily   sodium chloride  flush  3 mL Intravenous Q12H    Telemetry/ECG   sinus - Personally Reviewed  Physical Exam   General: Well developed, well nourished, NAD  HEENT: OP clear SKIN: warm, dry.  Neuro: No focal deficits  Musculoskeletal: Muscle strength 5/5 all ext  Psychiatric: Mood and affect normal  Neck: No JVD Lungs:Clear bilaterally Cardiovascular: Regular rate and rhythm.   Abdomen:Soft.  Extremities: No lower extremity edema.   Assessment & Plan   53 y.o. female with a history of CAD with severe LAD disease noted on recent coronary CTA on 11/21/2023, hypertension, hyperlipidemia, GERD, obstructive sleep apnea, and obesity who was admitted on 11/26/2023 for unstable angina after presenting with chest pain. Cardiac cath with severe LAD/Diagonal disease.  2V CABG on 11/29/23 with LIMA to LAD, radial artery graft to Diagonal)  CAD/Unstable Angina: Doing well POD 1 following 2V CABG. Hemodynamically stable. Continue ASA and Crestor.   Hyperlipidemia: Continue statin  For questions or updates, please contact [facility] Please consult [email or web address] for contact info under    Signed, Lonni Cash, MD  11/30/2023 7:25 AM

## 2023-11-30 NOTE — Plan of Care (Signed)
  Problem: Coping: Goal: Ability to adjust to condition or change in health will improve Outcome: Progressing   Problem: Fluid Volume: Goal: Ability to maintain a balanced intake and output will improve Outcome: Progressing   Problem: Health Behavior/Discharge Planning: Goal: Ability to identify and utilize available resources and services will improve Outcome: Progressing Goal: Ability to manage health-related needs will improve Outcome: Progressing   Problem: Metabolic: Goal: Ability to maintain appropriate glucose levels will improve Outcome: Progressing   Problem: Nutritional: Goal: Maintenance of adequate nutrition will improve Outcome: Progressing Goal: Progress toward achieving an optimal weight will improve Outcome: Progressing   Problem: Skin Integrity: Goal: Risk for impaired skin integrity will decrease Outcome: Progressing   Problem: Tissue Perfusion: Goal: Adequacy of tissue perfusion will improve Outcome: Progressing   Problem: Education: Goal: Understanding of CV disease, CV risk reduction, and recovery process will improve Outcome: Progressing   Problem: Activity: Goal: Ability to return to baseline activity level will improve Outcome: Progressing   Problem: Cardiovascular: Goal: Ability to achieve and maintain adequate cardiovascular perfusion will improve Outcome: Progressing Goal: Vascular access site(s) Level 0-1 will be maintained Outcome: Progressing   Problem: Health Behavior/Discharge Planning: Goal: Ability to safely manage health-related needs after discharge will improve Outcome: Progressing   Problem: Education: Goal: Knowledge of General Education information will improve Description: Including pain rating scale, medication(s)/side effects and non-pharmacologic comfort measures Outcome: Progressing   Problem: Health Behavior/Discharge Planning: Goal: Ability to manage health-related needs will improve Outcome: Progressing    Problem: Clinical Measurements: Goal: Ability to maintain clinical measurements within normal limits will improve Outcome: Progressing Goal: Will remain free from infection Outcome: Progressing Goal: Diagnostic test results will improve Outcome: Progressing Goal: Respiratory complications will improve Outcome: Progressing Goal: Cardiovascular complication will be avoided Outcome: Progressing   Problem: Activity: Goal: Risk for activity intolerance will decrease Outcome: Progressing   Problem: Nutrition: Goal: Adequate nutrition will be maintained Outcome: Progressing   Problem: Coping: Goal: Level of anxiety will decrease Outcome: Progressing   Problem: Elimination: Goal: Will not experience complications related to bowel motility Outcome: Progressing Goal: Will not experience complications related to urinary retention Outcome: Progressing   Problem: Pain Managment: Goal: General experience of comfort will improve and/or be controlled Outcome: Progressing   Problem: Safety: Goal: Ability to remain free from injury will improve Outcome: Progressing   Problem: Skin Integrity: Goal: Risk for impaired skin integrity will decrease Outcome: Progressing   Problem: Education: Goal: Will demonstrate proper wound care and an understanding of methods to prevent future damage Outcome: Progressing Goal: Knowledge of disease or condition will improve Outcome: Progressing Goal: Knowledge of the prescribed therapeutic regimen will improve Outcome: Progressing   Problem: Activity: Goal: Risk for activity intolerance will decrease Outcome: Progressing   Problem: Cardiac: Goal: Will achieve and/or maintain hemodynamic stability Outcome: Progressing   Problem: Clinical Measurements: Goal: Postoperative complications will be avoided or minimized Outcome: Progressing   Problem: Respiratory: Goal: Respiratory status will improve Outcome: Progressing   Problem: Skin  Integrity: Goal: Wound healing without signs and symptoms of infection Outcome: Progressing Goal: Risk for impaired skin integrity will decrease Outcome: Progressing   Problem: Urinary Elimination: Goal: Ability to achieve and maintain adequate renal perfusion and functioning will improve Outcome: Progressing

## 2023-11-30 NOTE — Plan of Care (Signed)
  Problem: Metabolic: Goal: Ability to maintain appropriate glucose levels will improve Outcome: Progressing   Problem: Nutritional: Goal: Maintenance of adequate nutrition will improve Outcome: Progressing   Problem: Coping: Goal: Level of anxiety will decrease Outcome: Progressing   Problem: Elimination: Goal: Will not experience complications related to urinary retention Outcome: Progressing   Problem: Pain Managment: Goal: General experience of comfort will improve and/or be controlled Outcome: Progressing

## 2023-12-01 ENCOUNTER — Inpatient Hospital Stay (HOSPITAL_COMMUNITY)

## 2023-12-01 DIAGNOSIS — I25110 Atherosclerotic heart disease of native coronary artery with unstable angina pectoris: Secondary | ICD-10-CM

## 2023-12-01 DIAGNOSIS — E785 Hyperlipidemia, unspecified: Secondary | ICD-10-CM | POA: Diagnosis not present

## 2023-12-01 LAB — BASIC METABOLIC PANEL WITH GFR
Anion gap: 12 (ref 5–15)
BUN: 12 mg/dL (ref 6–20)
CO2: 22 mmol/L (ref 22–32)
Calcium: 7.9 mg/dL — ABNORMAL LOW (ref 8.9–10.3)
Chloride: 99 mmol/L (ref 98–111)
Creatinine, Ser: 0.75 mg/dL (ref 0.44–1.00)
GFR, Estimated: >60 mL/min (ref >60)
Glucose, Bld: 135 mg/dL — ABNORMAL HIGH (ref 70–99)
Potassium: 4 mmol/L (ref 3.5–5.1)
Sodium: 133 mmol/L — ABNORMAL LOW (ref 135–145)

## 2023-12-01 LAB — CBC
HCT: 34.4 % — ABNORMAL LOW (ref 36.0–46.0)
Hemoglobin: 11.1 g/dL — ABNORMAL LOW (ref 12.0–15.0)
MCH: 28.8 pg (ref 26.0–34.0)
MCHC: 32.3 g/dL (ref 30.0–36.0)
MCV: 89.4 fL (ref 80.0–100.0)
Platelets: 274 K/uL (ref 150–400)
RBC: 3.85 MIL/uL — ABNORMAL LOW (ref 3.87–5.11)
RDW: 12.8 % (ref 11.5–15.5)
WBC: 16.5 K/uL — ABNORMAL HIGH (ref 4.0–10.5)
nRBC: 0 % (ref 0.0–0.2)

## 2023-12-01 LAB — GLUCOSE, CAPILLARY
Glucose-Capillary: 141 mg/dL — ABNORMAL HIGH (ref 70–99)
Glucose-Capillary: 108 mg/dL — ABNORMAL HIGH (ref 70–99)
Glucose-Capillary: 193 mg/dL — ABNORMAL HIGH (ref 70–99)
Glucose-Capillary: 126 mg/dL — ABNORMAL HIGH (ref 70–99)
Glucose-Capillary: 89 mg/dL (ref 70–99)
Glucose-Capillary: 248 mg/dL — ABNORMAL HIGH (ref 70–99)

## 2023-12-01 MED ORDER — AMLODIPINE BESYLATE 5 MG PO TABS
2.5000 mg | ORAL_TABLET | Freq: Every day | ORAL | Status: DC
  Administered 2023-12-01 – 2023-12-03 (×3): 2.5 mg via ORAL
  Filled 2023-12-01 (×3): qty 1

## 2023-12-01 MED ORDER — SODIUM CHLORIDE 0.9 % IV SOLN: 250.0000 mL | INTRAVENOUS | Status: AC | PRN

## 2023-12-01 MED ORDER — INSULIN ASPART 100 UNIT/ML IJ SOLN
0.0000 [IU] | Freq: Three times a day (TID) | INTRAMUSCULAR | Status: DC
  Administered 2023-12-01 – 2023-12-02 (×2): 2 [IU] via SUBCUTANEOUS
  Administered 2023-12-02: 4 [IU] via SUBCUTANEOUS
  Administered 2023-12-03: 8 [IU] via SUBCUTANEOUS
  Administered 2023-12-03: 2 [IU] via SUBCUTANEOUS

## 2023-12-01 MED ORDER — ~~LOC~~ CARDIAC SURGERY, PATIENT & FAMILY EDUCATION: Freq: Once | Status: DC

## 2023-12-01 MED ORDER — FUROSEMIDE 20 MG PO TABS
20.0000 mg | ORAL_TABLET | Freq: Every day | ORAL | Status: DC
  Administered 2023-12-01: 20 mg via ORAL
  Filled 2023-12-01: qty 1

## 2023-12-01 MED ORDER — SODIUM CHLORIDE 0.9% FLUSH: 3.0000 mL | INTRAVENOUS | Status: DC | PRN

## 2023-12-01 MED ORDER — SODIUM CHLORIDE 0.9% FLUSH
3.0000 mL | Freq: Two times a day (BID) | INTRAVENOUS | Status: DC
  Administered 2023-12-01 – 2023-12-03 (×5): 3 mL via INTRAVENOUS

## 2023-12-01 MED FILL — Heparin Sodium (Porcine) Inj 1000 Unit/ML: INTRAMUSCULAR | Qty: 10 | Status: AC

## 2023-12-01 MED FILL — Sodium Bicarbonate IV Soln 8.4%: INTRAVENOUS | Qty: 50 | Status: AC

## 2023-12-01 MED FILL — Lidocaine HCl Local Soln Prefilled Syringe 100 MG/5ML (2%): INTRAMUSCULAR | Qty: 5 | Status: AC

## 2023-12-01 MED FILL — Mannitol IV Soln 20%: INTRAVENOUS | Qty: 500 | Status: AC

## 2023-12-01 MED FILL — Electrolyte-R (PH 7.4) Solution: INTRAVENOUS | Qty: 3000 | Status: AC

## 2023-12-01 MED FILL — Sodium Chloride IV Soln 0.9%: INTRAVENOUS | Qty: 2000 | Status: AC

## 2023-12-01 NOTE — Progress Notes (Signed)
 Progress Note  Patient Name: Jasmin Gordon Date of Encounter: 12/01/2023 [facility] Cardiologist: Alean SAUNDERS Madireddy, MD   Interval Summary    Chest wall is sore.  No events  Vital Signs Vitals:   12/01/23 0400 12/01/23 0600 12/01/23 0700 12/01/23 0800  BP: (!) 88/61 110/65 116/73 106/75  Pulse: 95 99 (!) 103 (!) 101  Resp: 13 (!) 26 (!) 23 (!) 22  Temp:   (!) 97.4 F (36.3 C)   TempSrc:   Axillary   SpO2: 98% 98% 96% 97%  Weight:  108.6 kg    Height:        Intake/Output Summary (Last 24 hours) at 12/01/2023 0848 Last data filed at 12/01/2023 0800 Gross per 24 hour  Intake 369.2 ml  Output 1420 ml  Net -1050.8 ml      12/01/2023    6:00 AM 11/30/2023    5:00 AM 11/29/2023    4:52 AM  Last 3 Weights  Weight (lbs) 239 lb 6.4 oz 242 lb 8.1 oz 233 lb 9.6 oz  Weight (kg) 108.591 kg 110 kg 105.96 kg     Current Meds:   acetaminophen   1,000 mg Oral Q6H   Or   acetaminophen  (TYLENOL ) oral liquid 160 mg/5 mL  1,000 mg Oral Q6H   amLODipine   2.5 mg Oral Daily   aspirin EC  325 mg Oral Daily   Or   aspirin  324 mg Oral Daily   bisacodyl  10 mg Oral Daily   Or   bisacodyl  10 mg Rectal Daily   Chlorhexidine Gluconate Cloth  6 each Topical Daily   [facility], Patient & Family Education   Does not apply Once   docusate sodium   200 mg Oral Daily   enoxaparin  (LOVENOX ) injection  40 mg Subcutaneous QHS   furosemide  20 mg Oral Daily   insulin aspart  0-24 Units Subcutaneous TID WC   insulin aspart  4 Units Subcutaneous TID WC   insulin glargine-yfgn  35 Units Subcutaneous Daily   metoprolol  tartrate  12.5 mg Oral BID   Or   metoprolol  tartrate  12.5 mg Oral BID   mupirocin ointment  1 Application Nasal BID   pantoprazole   40 mg Oral Daily   rosuvastatin  40 mg Oral Daily   sodium chloride  flush  3 mL Intravenous Q12H    Telemetry/ECG   Sinus tachycardia- Personally Reviewed  Physical Exam  General: Well developed, well  nourished, NAD SKIN: warm, dry. No rashes. Neuro: No focal deficits  Psychiatric: Mood and affect normal  Neck: No JVD Lungs:Clear bilaterally Cardiovascular: Regular rate and rhythm. No murmurs, gallops or rubs. Abdomen:Soft.  Extremities: No lower extremity edema.   Assessment & Plan   53 y.o. female with a history of CAD with severe LAD disease noted on recent coronary CTA on 11/21/2023, hypertension, hyperlipidemia, GERD, obstructive sleep apnea, and obesity who was admitted on 11/26/2023 for unstable angina after presenting with chest pain. Cardiac cath with severe LAD/Diagonal disease. 2V CABG on 11/29/23 with LIMA to LAD, radial artery graft to Diagonal)  CAD/Unstable Angina: Progressing well POD 2 following 2V CABG. BP is stable. Continue ASA and Crestor.    Hyperlipidemia: Continue statin   We will follow from a distance over the weekend and will be available if needed.   For questions or updates, please contact  [facility] Please consult [email or web address] for contact info under    Signed, Lonni Cash, MD  12/01/2023 8:48  AM

## 2023-12-01 NOTE — Progress Notes (Addendum)
      [address] [place] [employer] [address]             [phone]         2 Days Post-Op Procedure(s) (LRB): CORONARY ARTERY BYPASS GRAFTING (CABG) TIMES TWO USING LEFT RADIAL ARTERY AND LEFT INTERNAL MAMMARY ARTERY (N/A) SURGICAL PROCUREMENT, ARTERY, RADIAL (Left) ECHOCARDIOGRAM, TRANSESOPHAGEAL, INTRAOPERATIVE (N/A)  Subjective:  Patient having some discomfort across her upper chest.  She ambulated twice yesterday.  No further nausea.  She is passing gas, but has not moved her bowels yet.  Objective: Vital signs in last 24 hours: Temp:  [97.4 F (36.3 C)-98.1 F (36.7 C)] 97.4 F (36.3 C) (10/31 0700) Pulse Rate:  [77-103] 101 (10/31 0800) Cardiac Rhythm: Normal sinus rhythm (10/31 0800) Resp:  [13-26] 22 (10/31 0800) BP: (84-137)/(55-94) 106/75 (10/31 0800) SpO2:  [88 %-98 %] 97 % (10/31 0800) Weight:  [108.6 kg] 108.6 kg (10/31 0600)  Intake/Output from previous day: 10/30 0701 - 10/31 0700 In: 318.2 [I.V.:100.5; IV Piggyback:217.7] Out: 1420 [Urine:1420] Intake/Output this shift: Total I/O In: 100 [IV Piggyback:100] Out: -   General appearance: alert, cooperative, and no distress Heart: regular rate and rhythm Lungs: diminished breath sounds bibasilar Abdomen: soft, non-tender; bowel sounds normal; no masses,  no organomegaly Extremities: edema trace bilaterally, mild tingling in right thumb likely from radial artery harvest Wound: clean and dry  Lab Results: Recent Labs    11/30/23 1746 12/01/23 0609  WBC 19.9* 16.5*  HGB 11.6* 11.1*  HCT 35.3* 34.4*  PLT 295 274   BMET:  Recent Labs    11/30/23 1746 12/01/23 0609  NA 135 133*  K 4.3 4.0  CL 102 99  CO2 24 22  GLUCOSE 166* 135*  BUN 10 12  CREATININE 0.79 0.75  CALCIUM 7.9* 7.9*    PT/INR:  Recent Labs    11/29/23 1309  LABPROT 16.0*  INR 1.2   ABG    Component Value Date/Time   PHART 7.378 11/29/2023 1754   HCO3 23.4 11/29/2023 1754   TCO2 25 11/29/2023  1754   ACIDBASEDEF 1.0 11/29/2023 1754   O2SAT 96 11/29/2023 1754   CBG (last 3)  Recent Labs    11/30/23 2339 12/01/23 0335 12/01/23 0747  GLUCAP 141* 108* 193*    Assessment/Plan: S/P Procedure(s) (LRB): CORONARY ARTERY BYPASS GRAFTING (CABG) TIMES TWO USING LEFT RADIAL ARTERY AND LEFT INTERNAL MAMMARY ARTERY (N/A) SURGICAL PROCUREMENT, ARTERY, RADIAL (Left) ECHOCARDIOGRAM, TRANSESOPHAGEAL, INTRAOPERATIVE (N/A)  CV- Sinus Tach, BP is low around 100- unable to titrate BB at this time for additional HR control, will decrease Norvasc  to 2.5 mg for Radial artery Pulm- CTs out, left basilar atelectasis, continue IS... wean oxygen as tolerated Renal- creatinine has been stable, weight is trending down, transition to oral lasix  supplement potassium DM- sugars are well controlled continue current regimen DIspo- patient stable, transfer to 4E   LOS: 5 days    Rocky Shad, PA-C 12/01/2023 8:34 AM

## 2023-12-01 NOTE — Plan of Care (Signed)
  Problem: Coping: Goal: Ability to adjust to condition or change in health will improve Outcome: Progressing   Problem: Fluid Volume: Goal: Ability to maintain a balanced intake and output will improve Outcome: Progressing   Problem: Health Behavior/Discharge Planning: Goal: Ability to identify and utilize available resources and services will improve Outcome: Progressing Goal: Ability to manage health-related needs will improve Outcome: Progressing

## 2023-12-02 ENCOUNTER — Inpatient Hospital Stay (HOSPITAL_COMMUNITY)

## 2023-12-02 DIAGNOSIS — D72829 Elevated white blood cell count, unspecified: Secondary | ICD-10-CM

## 2023-12-02 DIAGNOSIS — R000 Tachycardia, unspecified: Secondary | ICD-10-CM

## 2023-12-02 LAB — CBC
HCT: 35.4 % — ABNORMAL LOW (ref 36.0–46.0)
Hemoglobin: 11.6 g/dL — ABNORMAL LOW (ref 12.0–15.0)
MCH: 28.9 pg (ref 26.0–34.0)
MCHC: 32.8 g/dL (ref 30.0–36.0)
MCV: 88.3 fL (ref 80.0–100.0)
Platelets: 303 K/uL (ref 150–400)
RBC: 4.01 MIL/uL (ref 3.87–5.11)
RDW: 12.5 % (ref 11.5–15.5)
WBC: 16.3 K/uL — ABNORMAL HIGH (ref 4.0–10.5)
nRBC: 0 % (ref 0.0–0.2)

## 2023-12-02 LAB — BASIC METABOLIC PANEL WITH GFR
Anion gap: 12 (ref 5–15)
BUN: 10 mg/dL (ref 6–20)
CO2: 25 mmol/L (ref 22–32)
Calcium: 8.3 mg/dL — ABNORMAL LOW (ref 8.9–10.3)
Chloride: 99 mmol/L (ref 98–111)
Creatinine, Ser: 0.56 mg/dL (ref 0.44–1.00)
GFR, Estimated: >60 mL/min (ref >60)
Glucose, Bld: 113 mg/dL — ABNORMAL HIGH (ref 70–99)
Potassium: 3.5 mmol/L (ref 3.5–5.1)
Sodium: 136 mmol/L (ref 135–145)

## 2023-12-02 LAB — GLUCOSE, CAPILLARY
Glucose-Capillary: 109 mg/dL — ABNORMAL HIGH (ref 70–99)
Glucose-Capillary: 145 mg/dL — ABNORMAL HIGH (ref 70–99)
Glucose-Capillary: 185 mg/dL — ABNORMAL HIGH (ref 70–99)
Glucose-Capillary: 162 mg/dL — ABNORMAL HIGH (ref 70–99)

## 2023-12-02 MED ORDER — GUAIFENESIN ER 600 MG PO TB12
600.0000 mg | ORAL_TABLET | Freq: Two times a day (BID) | ORAL | Status: DC
  Administered 2023-12-02 – 2023-12-03 (×2): 600 mg via ORAL
  Filled 2023-12-02 (×2): qty 1

## 2023-12-02 NOTE — Progress Notes (Addendum)
 Pt received education on restrictions, heart healthy diet, ex guidelines, Move in the Tube sheet, incentive spirometer use when d/c and CRPII. Pt denies questions and was encouraged to look in the book for additional information. Referral placed to [facility].   All questions answered prior to leaving.  Garen FORBES Candy, MS ACSM-CEP  12/02/2023 12:04 PM

## 2023-12-02 NOTE — Progress Notes (Signed)
 Pt c/o difficulty coughing up mucus, Verbal order from Breah Joa Barrett. PA-C for Mucinex  600 mg BID

## 2023-12-02 NOTE — Progress Notes (Signed)
 Mobility Specialist Progress Note:    12/02/23 1104  Mobility  Activity Ambulated independently  Level of Assistance Independent  Assistive Device None  Distance Ambulated (ft) 300 ft  Activity Response Tolerated well  Mobility Referral Yes  Mobility visit 1 Mobility  Mobility Specialist Start Time (ACUTE ONLY) 1104  Mobility Specialist Stop Time (ACUTE ONLY) 1115  Mobility Specialist Time Calculation (min) (ACUTE ONLY) 11 min   Pt received in chair, eager for mobility session. Ambulated in hallway independently. Tolerated well, pt limited by fatigue. Returned to room, lying in bed comfortably, all needs met. Sister at beside.   Skylier Kretschmer Mobility Specialist Please contact via Special Educational Needs Teacher or  Rehab office at [phone]

## 2023-12-02 NOTE — Progress Notes (Addendum)
      [address] [place] [employer] [address]             [phone]         3 Days Post-Op Procedure(s) (LRB): CORONARY ARTERY BYPASS GRAFTING (CABG) TIMES TWO USING LEFT RADIAL ARTERY AND LEFT INTERNAL MAMMARY ARTERY (N/A) SURGICAL PROCUREMENT, ARTERY, RADIAL (Left) ECHOCARDIOGRAM, TRANSESOPHAGEAL, INTRAOPERATIVE (N/A)  Subjective:  Patient doing well.  Pain is better, continues to be worse with coughing.  She is ambulating.  No BM  Objective: Vital signs in last 24 hours: Temp:  [97 F (36.1 C)-99 F (37.2 C)] 98.7 F (37.1 C) (11/01 0806) Pulse Rate:  [97-107] 100 (11/01 0806) Cardiac Rhythm: Sinus tachycardia;Normal sinus rhythm (10/31 2014) Resp:  [15-23] 20 (11/01 0806) BP: (91-118)/(52-73) 101/69 (11/01 0806) SpO2:  [95 %-100 %] 99 % (11/01 0806) Weight:  [108.2 kg] 108.2 kg (11/01 0600)  Intake/Output from previous day: 10/31 0701 - 11/01 0700 In: 220 [P.O.:120; IV Piggyback:100] Out: 550 [Urine:550]  General appearance: alert, cooperative, and no distress Heart: regular rate and rhythm Lungs: clear to auscultation bilaterally Abdomen: soft, non-tender; bowel sounds normal; no masses,  no organomegaly Extremities: edema trace Wound: clean and dry  Lab Results: Recent Labs    12/01/23 0609 12/02/23 0306  WBC 16.5* 16.3*  HGB 11.1* 11.6*  HCT 34.4* 35.4*  PLT 274 303   BMET:  Recent Labs    12/01/23 0609 12/02/23 0306  NA 133* 136  K 4.0 3.5  CL 99 99  CO2 22 25  GLUCOSE 135* 113*  BUN 12 10  CREATININE 0.75 0.56  CALCIUM 7.9* 8.3*    PT/INR:  Recent Labs    11/29/23 1309  LABPROT 16.0*  INR 1.2   ABG    Component Value Date/Time   PHART 7.378 11/29/2023 1754   HCO3 23.4 11/29/2023 1754   TCO2 25 11/29/2023 1754   ACIDBASEDEF 1.0 11/29/2023 1754   O2SAT 96 11/29/2023 1754   CBG (last 3)  Recent Labs    12/01/23 1639 12/01/23 2112 12/02/23 0615  GLUCAP 89 248* 109*    Assessment/Plan: S/P Procedure(s)  (LRB): CORONARY ARTERY BYPASS GRAFTING (CABG) TIMES TWO USING LEFT RADIAL ARTERY AND LEFT INTERNAL MAMMARY ARTERY (N/A) SURGICAL PROCUREMENT, ARTERY, RADIAL (Left) ECHOCARDIOGRAM, TRANSESOPHAGEAL, INTRAOPERATIVE (N/A)  CV- Sinus Tach- BP remains low around 100- continue Lopressor  at current dose.. unable to titrate at this time  Pulm- off oxygen, CXR w/o effusions, some left basilar atelectasis Renal- creatinine stable, weight is below baseline, will stop Lasix, potassium.. this may help with blood pressure ID- + leukocytosis. Likely SIRS, will monitor. No evidence of infection DM- sugars are well controlled continue current insulin regimen  Dispo- patient stable, doing well overall.. she is tachycardic however unable to titrate BB in setting of low blood pressure, stop Lasix.... patient likely ready for d/c in next 24-48 hours   LOS: 6 days    Rocky Shad, PA-C 12/02/2023 8:45 AM   Chart reviewed, patient examined, agree with above.  She is progressing fairly well POD 3. Bowels working, ambulating.

## 2023-12-03 DIAGNOSIS — Z951 Presence of aortocoronary bypass graft: Secondary | ICD-10-CM

## 2023-12-03 DIAGNOSIS — Z9889 Other specified postprocedural states: Secondary | ICD-10-CM

## 2023-12-03 HISTORY — DX: Other specified postprocedural states: Z98.890

## 2023-12-03 HISTORY — DX: Presence of aortocoronary bypass graft: Z95.1

## 2023-12-03 LAB — GLUCOSE, CAPILLARY
Glucose-Capillary: 156 mg/dL — ABNORMAL HIGH (ref 70–99)
Glucose-Capillary: 227 mg/dL — ABNORMAL HIGH (ref 70–99)

## 2023-12-03 MED ORDER — ASPIRIN EC 81 MG PO TBEC: 81.0000 mg | DELAYED_RELEASE_TABLET | Freq: Every day | ORAL | Status: DC

## 2023-12-03 MED ORDER — GUAIFENESIN ER 600 MG PO TB12: 600.0000 mg | ORAL_TABLET | Freq: Two times a day (BID) | ORAL | 0 refills | Status: AC

## 2023-12-03 MED ORDER — CARVEDILOL 6.25 MG PO TABS: 6.2500 mg | ORAL_TABLET | Freq: Two times a day (BID) | ORAL | 11 refills | Status: DC

## 2023-12-03 MED ORDER — OXYCODONE HCL 5 MG PO TABS: 5.0000 mg | ORAL_TABLET | ORAL | 0 refills | Status: DC | PRN

## 2023-12-03 MED ORDER — ROSUVASTATIN CALCIUM 40 MG PO TABS: 40.0000 mg | ORAL_TABLET | Freq: Every day | ORAL | 3 refills | Status: AC

## 2023-12-03 MED ORDER — ACETAMINOPHEN 500 MG PO TABS
500.0000 mg | ORAL_TABLET | Freq: Four times a day (QID) | ORAL | 0 refills | Status: AC
Start: 2023-12-03 — End: ?

## 2023-12-03 MED ORDER — AMLODIPINE BESYLATE 2.5 MG PO TABS: 2.5000 mg | ORAL_TABLET | Freq: Every day | ORAL | 0 refills | Status: DC

## 2023-12-03 MED ORDER — ASPIRIN 325 MG PO TBEC: 325.0000 mg | DELAYED_RELEASE_TABLET | Freq: Every day | ORAL | Status: DC

## 2023-12-03 MED ORDER — CLOPIDOGREL BISULFATE 75 MG PO TABS: 75.0000 mg | ORAL_TABLET | Freq: Every day | ORAL | 11 refills | Status: DC

## 2023-12-03 NOTE — Discharge Instructions (Signed)

## 2023-12-03 NOTE — Discharge Summary (Cosign Needed Addendum)
 [address] [place] [address]             [phone]        Physician Discharge Summary  Patient ID: Jasmin Gordon MRN: [id number] DOB/AGE: 03-13-1970 53 y.o.  Admit date: 11/26/2023 Discharge date: 12/03/2023  Admission Diagnoses:  Patient Active Problem List   Diagnosis Date Noted   Unstable angina (HCC) 11/26/2023   Hyperlipidemia 11/23/2023   CAD (coronary artery disease), abnormal cardiac CT 11-21-2023 11/22/2023   Preop cardiovascular exam 11/13/2023   Atypical chest pain    BMI 40.0-44.9, adult (HCC)    Diabetic gastroparesis associated with type 2 diabetes mellitus (HCC)    Morbid obesity (HCC)    Sinus tachycardia by electrocardiogram    Type 2 diabetes mellitus with hyperlipidemia (HCC)    Sleep apnea 08/11/2017   GERD (gastroesophageal reflux disease) 08/11/2017   Diabetes mellitus (HCC) 08/11/2017   Obese 08/11/2017   Malaise and fatigue 08/11/2017   Hoarseness 08/11/2017   Oliguria 08/11/2017   Paresthesia 08/11/2017   Numbness of foot 08/11/2017   Left flank pain 08/11/2017   Chest pain 08/11/2017   Dyspnea on exertion 08/11/2017   Cough 08/11/2017   Acute cystitis 08/11/2017   Sciatica 08/11/2017   Anxiety and depression 08/11/2017   Constipation 08/14/2011   HTN (hypertension) 08/09/2011   Lower urinary tract infectious disease 08/08/2011   Radiculopathy, lumbosacral region 08/08/2011   Intractable low back pain 08/07/2011   Discharge Diagnoses:  Patient Active Problem List   Diagnosis Date Noted   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   H/O open surgical procurement of radial artery 12/03/2023   Unstable angina (HCC) 11/26/2023   Hyperlipidemia 11/23/2023   CAD (coronary artery disease), abnormal cardiac CT 11-21-2023 11/22/2023   Preop cardiovascular exam 11/13/2023   Atypical chest pain    BMI 40.0-44.9, adult (HCC)    Diabetic gastroparesis associated with type 2 diabetes mellitus (HCC)    Morbid obesity (HCC)     Sinus tachycardia by electrocardiogram    Type 2 diabetes mellitus with hyperlipidemia (HCC)    Sleep apnea 08/11/2017   GERD (gastroesophageal reflux disease) 08/11/2017   Diabetes mellitus (HCC) 08/11/2017   Obese 08/11/2017   Malaise and fatigue 08/11/2017   Hoarseness 08/11/2017   Oliguria 08/11/2017   Paresthesia 08/11/2017   Numbness of foot 08/11/2017   Left flank pain 08/11/2017   Chest pain 08/11/2017   Dyspnea on exertion 08/11/2017   Cough 08/11/2017   Acute cystitis 08/11/2017   Sciatica 08/11/2017   Anxiety and depression 08/11/2017   Constipation 08/14/2011   HTN (hypertension) 08/09/2011   Lower urinary tract infectious disease 08/08/2011   Radiculopathy, lumbosacral region 08/08/2011   Intractable low back pain 08/07/2011   Discharged Condition: good  History of Present Illness:  Jasmin Gordon is a 53 yo female with multiple cardiac risk factors including hypertension, diabetes, class 3 obesity, hyperlipidemia and obstructive sleep apnea.  The patient presented to the emergency department on 11/26/2023 with chief complaint of chest pain.   She reports that the pain is something she has noted since September and she was initially seen by cardiology and a CT coronary study showed proximal LAD lesion that was felt to be hemodynamically significant.  She describes the pain as substernal without radiation.  Initially it was felt to be primarily exertional but over time she developed resting symptoms.  In the ER initial troponins were obtained x  2 and were negative for elevation.  A chest CTA was obtained which was negative for dissection or intramural hematoma.  There is no evidence of aneurysm.  There was no evidence of pulmonary embolism within the limitations of this exam.  She was initially discharged but re- presented back to the emergency department on the same day with similar symptoms and her troponin was 30 prompting cardiology consultation and subsequent admission.  It  is noted that she was scheduled for cardiac catheterization on October 29 previously.  She was admitted for further evaluation and treatment including cardiac catheterization.  Hospital Course:   Catheterization was performed on 10/27 and revealed a proximal LAD lesion that is 90% and a 70% first diagonal stenosis.  Anatomically this is not felt to be a good PCI situation and we are asked to consider surgical options.  Echocardiogram was performed on 11/15/2023 and she is noted to have normal LV and RV function no significant valvular disease.  She was evaluated by Dr. Kerrin who felt she would be a reasonable candidate for coronary bypass grafting.  The risks and benefits of the procedure were explained to the patient and she was agreeable to proceed.  She was taken to the operating room on 11/29/2023.  She underwent CABG x 2 utilizing LIMA to LAD, Radial artery to Diagonal.  She also underwent open harvest of left radial artery.  She tolerated the procedure without difficulty and was taken to the SICU in stable condition.  Vital signs and hemodynamics remained stable.  Ventilator support was weaned routinely allowing for sedation by 5 PM on the day of surgery.  Postop day 1, the PA catheter and arterial line were removed along with the patient wires.  Norvasc  was initiated for optimization of patency.  She was transition from insulin drip to subcu insulin and sliding scale coverage.  Chest tubes were removed and she was mobilized  She remained in Sinus Rhythm but was tachycardic.  She was unable to have titration of her BB due to low blood pressure.  Her pacing wires were removed without difficulty.  She was felt stable for transfer to the progressive care unit on 12/01/2023.  The patient continued to do well.  Her weight was below baseline and Lasix was discontinued.  She had mild tingling in her left thumb from her radial artery harvest site.  She is maintaining Sinus Tachycardia.  Her weight is below  admission and her lasix regimen has been discontinued.  She has been started on Plavix for ACS on admission.  Her surgical incisions are healing without evidence of infection.  She is stable for discharge home today.  Consults: None  Significant Diagnostic Studies:  Cath    Prox RCA to Mid RCA lesion is 20% stenosed.   Prox LAD lesion is 90% stenosed.   1st Diag lesion is 70% stenosed.   Severe stenosis proximal LAD involving the large caliber diagonal branch. The Diagonal branch has at least moderate ostial stenosis. No obstructive disease in the circumflex Large dominant RCA with mild non-obstructive disease.  Elevated LVEDP.    Recommendations: Her severe stenosis is in the proximal LAD and involves a large caliber diagonal branch. PCI of the LAD would compromise flow into the Diagonal branch. Bifurcation stenting of the LAD/Diagonal would leave her with two layers of stent in the proximal LAD. I think the best option for revascularization is bypass of the LAD/Diagonal (LIMA and possible radial artery graft to the Diagonal).  Will continue ASA and  statin.  Consult to CT surgery for bypass.  Will diurese with IV Lasix given elevated LV filling pressure.   Treatments: surgery:   NAME: Soules, Stevie M. MEDICAL RECORD NO: [id number] ACCOUNT NO: [id number] DATE OF BIRTH: [birth year] FACILITY: [facility] LOCATION: [facility] PHYSICIAN: Elspeth BROCKS. Kerrin, MD   Operative Report    DATE OF PROCEDURE: 11/29/2023   PREOPERATIVE DIAGNOSIS: Severe single-vessel coronary artery disease.   POSTOPERATIVE DIAGNOSIS: Severe single-vessel coronary artery disease.   PROCEDURE: Median sternotomy, extracorporeal circulation, coronary artery bypass grafting x2 (left internal mammary artery to LAD, left radial artery to first diagonal).   SURGEON: Elspeth BROCKS. Kerrin, MD   ASSISTANT: Rocky Shad, PA.  Experienced assistant was necessary for this case due to surgical complexity. Nevea Spiewak independently  harvested the radial artery and closed the arm incision. She then assisted with exposure, retraction of delicate  tissues, suctioning, and suture management during the anastomosis.   Discharge Exam: Blood pressure 124/80, pulse 92, temperature 98.6 F (37 C), temperature source Oral, resp. rate 16, height 5' 2 (1.575 m), weight 108 kg, SpO2 96%.  General appearance: alert, cooperative, and no distress Heart: regular rate and rhythm Lungs: clear to auscultation bilaterally Abdomen: soft, non-tender; bowel sounds normal; no masses,  no organomegaly Extremities: edema trace Wound: clean and dry   Discharge Medications:  The patient has been discharged on:   1.Beta Blocker:  Yes [ X  ]                              No   [   ]                              If No, reason:  2.Ace Inhibitor/ARB: Yes [   ]                                     No  [   X ]                                     If No, reason: labile BP  3.Statin:   Yes [ X  ]                  No  [   ]                  If No, reason:  4.Ecasa:  Yes  [  X ]                  No   [   ]                  If No, reason:  Patient had ACS upon admission: Yes  Plavix/P2Y12 inhibitor: Yes [ X  ]                                      No  [   ]   Discharge Instructions     Amb Referral to Cardiac Rehabilitation   Complete by: As directed    Diagnosis: CABG   CABG X ___: 2   After  initial evaluation and assessments completed: Virtual Based Care may be provided alone or in conjunction with Phase 2 Cardiac Rehab based on patient barriers.: Yes   Intensive Cardiac Rehabilitation (ICR) [facility] location only OR Traditional Cardiac Rehabilitation (TCR) *If criteria for ICR are not met will enroll in TCR ([facility] only): Yes      Allergies as of 12/03/2023       Reactions   Bee Venom Anaphylaxis        Medication List     STOP taking these medications    losartan 25 MG tablet Commonly known as: COZAAR       TAKE these  medications    acetaminophen  500 MG tablet Commonly known as: TYLENOL  Take 1-2 tablets (500-1,000 mg total) by mouth every 6 (six) hours.   amLODipine  2.5 MG tablet Commonly known as: NORVASC  Take 1 tablet (2.5 mg total) by mouth daily. Start taking on: December 04, 2023   aspirin EC 81 MG tablet Take 1 tablet (81 mg total) by mouth daily. Start taking on: December 04, 2023   carvedilol 6.25 MG tablet Commonly known as: Coreg Take 1 tablet (6.25 mg total) by mouth 2 (two) times daily. What changed:  medication strength how much to take   clopidogrel 75 MG tablet Commonly known as: Plavix Take 1 tablet (75 mg total) by mouth daily.   guaiFENesin 600 MG 12 hr tablet Commonly known as: MUCINEX Take 1 tablet (600 mg total) by mouth 2 (two) times daily.   Lantus 100 UNIT/ML injection Generic drug: insulin glargine Inject 15 Units into the skin daily.   methocarbamol  500 MG tablet Commonly known as: ROBAXIN  Take 500 mg by mouth 3 (three) times daily.   oxyCODONE  5 MG immediate release tablet Commonly known as: Oxy IR/ROXICODONE  Take 1 tablet (5 mg total) by mouth every 4 (four) hours as needed for severe pain (pain score 7-10).   Ozempic (1 MG/DOSE) 4 MG/3ML Sopn Generic drug: Semaglutide (1 MG/DOSE) Inject 1 mg into the skin once a week. Thursdays   rosuvastatin 40 MG tablet Commonly known as: CRESTOR Take 1 tablet (40 mg total) by mouth daily. Start taking on: December 04, 2023 What changed:  medication strength how much to take        Follow-up Information     Rutha Manuelita HERO, PA-C Follow up on 12/08/2023.   Specialties: Physician Assistant, Thoracic Surgery Why: Appointment is at 10:00, please get CXR at 9:00 on 2nd floor of our office building prior to your appointment Contact information: [address] Zone [place] [address] [phone]         Liborio Alean SAUNDERS, MD Follow up on 12/18/2023.   Specialty: Cardiology Why:  Appointment is at 11:00 Contact information: [address] [place] [address] [phone]                 Signed:  Rocky Shad, PA-C  12/03/2023, 9:23 AM

## 2023-12-03 NOTE — Progress Notes (Signed)
      [address] [employer] [address]             [phone]         4 Days Post-Op Procedure(s) (LRB): CORONARY ARTERY BYPASS GRAFTING (CABG) TIMES TWO USING LEFT RADIAL ARTERY AND LEFT INTERNAL MAMMARY ARTERY (N/A) SURGICAL PROCUREMENT, ARTERY, RADIAL (Left) ECHOCARDIOGRAM, TRANSESOPHAGEAL, INTRAOPERATIVE (N/A)  Subjective:  Patient doing well.  She continues to have numbness in her left arm.  States Mucinex helped yesterday afternoon.  She has ambulated without difficulty.  She has moved her bowels.  She would like to go home today.  Objective: Vital signs in last 24 hours: Temp:  [98 F (36.7 C)-99.1 F (37.3 C)] 98.6 F (37 C) (11/02 0809) Pulse Rate:  [92-100] 92 (11/02 0418) Cardiac Rhythm: Sinus tachycardia (11/01 1903) Resp:  [16-20] 16 (11/02 0809) BP: (93-129)/(65-92) 124/80 (11/02 0809) SpO2:  [96 %-98 %] 96 % (11/02 0809) Weight:  [891 kg] 108 kg (11/02 0603)  Intake/Output from previous day: 11/01 0701 - 11/02 0700 In: 243 [P.O.:240; I.V.:3] Out: 4 [Urine:3; Stool:1] Intake/Output this shift: Total I/O In: 3 [I.V.:3] Out: -   General appearance: alert, cooperative, and no distress Heart: regular rate and rhythm Lungs: clear to auscultation bilaterally Abdomen: soft, non-tender; bowel sounds normal; no masses,  no organomegaly Extremities: edema trace Wound: clean and dry  Lab Results: Recent Labs    12/01/23 0609 12/02/23 0306  WBC 16.5* 16.3*  HGB 11.1* 11.6*  HCT 34.4* 35.4*  PLT 274 303   BMET:  Recent Labs    12/01/23 0609 12/02/23 0306  NA 133* 136  K 4.0 3.5  CL 99 99  CO2 22 25  GLUCOSE 135* 113*  BUN 12 10  CREATININE 0.75 0.56  CALCIUM 7.9* 8.3*    PT/INR: No results for input(s): LABPROT, INR in the last 72 hours. ABG    Component Value Date/Time   PHART 7.378 11/29/2023 1754   HCO3 23.4 11/29/2023 1754   TCO2 25 11/29/2023 1754   ACIDBASEDEF 1.0 11/29/2023 1754   O2SAT 96 11/29/2023  1754   CBG (last 3)  Recent Labs    12/02/23 1656 12/02/23 2122 12/03/23 0600  GLUCAP 185* 162* 156*    Assessment/Plan: S/P Procedure(s) (LRB): CORONARY ARTERY BYPASS GRAFTING (CABG) TIMES TWO USING LEFT RADIAL ARTERY AND LEFT INTERNAL MAMMARY ARTERY (N/A) SURGICAL PROCUREMENT, ARTERY, RADIAL (Left) ECHOCARDIOGRAM, TRANSESOPHAGEAL, INTRAOPERATIVE (N/A)  CV- Sinus Tach-rate improved.. BP is also increasing... continue Lopressor  at 12.5 mg BID.... can increase at office visit if her blood pressure continues to improve Pulm- no acute issues, continue IS Renal- creatinine stable, weight is below baseline.. patient instructed to monitor weight and swelling  Neuro: left finger tingling/numbness due to RA harvest.. should improve with time DM- sugars are well controlled Dispo- patient stable, will d/c home today   LOS: 7 days    Rocky Shad, PA-C 12/03/2023 9:04 AM

## 2023-12-04 ENCOUNTER — Telehealth (HOSPITAL_COMMUNITY): Payer: Self-pay

## 2023-12-04 NOTE — Telephone Encounter (Signed)
 Faxed outside referral for Phase II Cardiac Rehab to [facility].

## 2023-12-06 ENCOUNTER — Other Ambulatory Visit: Payer: Self-pay | Admitting: Thoracic Surgery (Cardiothoracic Vascular Surgery)

## 2023-12-06 DIAGNOSIS — Z951 Presence of aortocoronary bypass graft: Secondary | ICD-10-CM

## 2023-12-06 NOTE — Progress Notes (Signed)
 c

## 2023-12-07 NOTE — Progress Notes (Unsigned)
      [address] Jasmin Gordon CHILD [id number]             [id number]       HPI:  Patient returns for routine postoperative follow-up having undergone Coronary artery bypass grafting x2, Left internal mammary artery to LAD, Left radial artery to first diagonal on 11/29/2023 with Dr. Kerrin. The patient's early postoperative recovery while in the hospital was notable for ***. Since hospital discharge the patient reports ***.  Allergies as of 12/08/2023       Reactions   Bee Venom Anaphylaxis        Medication List        Accurate as of December 07, 2023 11:43 AM. If you have any questions, ask your nurse or doctor.          acetaminophen  500 MG tablet Commonly known as: TYLENOL  Take 1-2 tablets (500-1,000 mg total) by mouth every 6 (six) hours.   amLODipine  2.5 MG tablet Commonly known as: NORVASC  Take 1 tablet (2.5 mg total) by mouth daily.   aspirin EC 81 MG tablet Take 1 tablet (81 mg total) by mouth daily.   carvedilol 6.25 MG tablet Commonly known as: Coreg Take 1 tablet (6.25 mg total) by mouth 2 (two) times daily.   clopidogrel 75 MG tablet Commonly known as: Plavix Take 1 tablet (75 mg total) by mouth daily.   guaiFENesin 600 MG 12 hr tablet Commonly known as: MUCINEX Take 1 tablet (600 mg total) by mouth 2 (two) times daily.   Lantus 100 UNIT/ML injection Generic drug: insulin glargine Inject 15 Units into the skin daily.   methocarbamol  500 MG tablet Commonly known as: ROBAXIN  Take 500 mg by mouth 3 (three) times daily.   oxyCODONE  5 MG immediate release tablet Commonly known as: Oxy IR/ROXICODONE  Take 1 tablet (5 mg total) by mouth every 4 (four) hours as needed for severe pain (pain score 7-10).   Ozempic (1 MG/DOSE) 4 MG/3ML Sopn Generic drug: Semaglutide (1 MG/DOSE) Inject 1 mg into the skin once a week. Thursdays   rosuvastatin 40 MG tablet Commonly known as: CRESTOR Take 1 tablet (40 mg total) by mouth daily.          ROS    There were no vitals taken for this visit.     Imaging:    Assessment/Plan:    Manuelita CHRISTELLA Rough, PA-C 11:43 AM 12/07/23

## 2023-12-08 ENCOUNTER — Ambulatory Visit: Payer: Self-pay

## 2023-12-08 ENCOUNTER — Ambulatory Visit (HOSPITAL_COMMUNITY)
Admission: RE | Admit: 2023-12-08 | Discharge: 2023-12-08 | Disposition: A | Discharge status: Home / Self Care | Source: Ambulatory Visit | Attending: Internal Medicine | Admitting: Internal Medicine

## 2023-12-08 VITALS — BP 126/85 | HR 98 | Resp 20 | Wt 234.5 lb

## 2023-12-08 DIAGNOSIS — Z951 Presence of aortocoronary bypass graft: Secondary | ICD-10-CM | POA: Diagnosis present

## 2023-12-08 NOTE — Patient Instructions (Signed)
-  Follow up in 3 weeks with chest xray  You may continue to gradually increase your physical activity as tolerated.  Refrain from any heavy lifting or strenuous use of your arms and shoulders until at least 6 weeks from the time of your surgery, and avoid activities that cause increased pain in your chest on the side of your surgical incision.  Otherwise you may continue to increase activities without any particular limitations.  Increase the intensity and duration of physical activity gradually.

## 2023-12-11 ENCOUNTER — Telehealth: Payer: Self-pay

## 2023-12-11 NOTE — Telephone Encounter (Signed)
 FMLA form completed/ patient will pick up at front desk/ Beginning LOA 11/26/23 through 02/26/24./ DOS 11/29/23

## 2023-12-18 ENCOUNTER — Ambulatory Visit

## 2023-12-18 VITALS — BP 136/96 | HR 94 | Ht 62.0 in | Wt 232.2 lb

## 2023-12-18 DIAGNOSIS — E782 Mixed hyperlipidemia: Secondary | ICD-10-CM

## 2023-12-18 DIAGNOSIS — I10 Essential (primary) hypertension: Secondary | ICD-10-CM

## 2023-12-18 DIAGNOSIS — Z951 Presence of aortocoronary bypass graft: Secondary | ICD-10-CM

## 2023-12-18 DIAGNOSIS — I25118 Atherosclerotic heart disease of native coronary artery with other forms of angina pectoris: Secondary | ICD-10-CM

## 2023-12-18 MED ORDER — CLOPIDOGREL BISULFATE 75 MG PO TABS: 75.0000 mg | ORAL_TABLET | Freq: Every day | ORAL | 3 refills | Status: DC

## 2023-12-18 MED ORDER — ASPIRIN EC 81 MG PO TBEC: 81.0000 mg | DELAYED_RELEASE_TABLET | Freq: Every day | ORAL | 3 refills | Status: AC

## 2023-12-18 MED ORDER — CARVEDILOL 6.25 MG PO TABS: 6.2500 mg | ORAL_TABLET | Freq: Two times a day (BID) | ORAL | 3 refills | Status: DC

## 2023-12-18 NOTE — Progress Notes (Signed)
 Cardiology Consultation:    Date:  12/18/2023   ID:  Jasmin Gordon, DOB [birth year], MRN [id number]  PCP:  Jasmin Nest, MD  Cardiologist:  Jasmin SAUNDERS Zaniel Marineau, MD   Referring MD: Jasmin Nest, MD   No chief complaint on file.    ASSESSMENT AND PLAN:   Jasmin Gordon 53 year old woman with history of CAD with symptoms of shortness of breath, followed by abnormal cardiac CT 11/21/2022, scheduled for elective cardiac cath, presented to the ER for ongoing chest pain, unremarkable troponins, but with ongoing symptoms proceeded with inpatient cardiac cath 11/27/2023, followed by CT surgery evaluation and CABG 11/30/2023 LIMA-LAD and left radial artery graft to diagonal 1. Also has history of diabetes mellitus, hypertension, obesity, OSA, GERD, hyperlipidemia. Echocardiogram 11/15/2023 noted normal biventricular function EF 60 to 65%, grade 1 diastolic dysfunction without significant valve abnormality IntraOp TEE noted normal LVEF greater than 65%, no significant valve abnormalities.  Here for follow-up visit after discharge from the hospital.  Problem List Items Addressed This Visit     HTN (hypertension)   Suboptimal in the office today. Continue carvedilol 12.5 mg twice daily Continue amlodipine  2.5 mg once daily Maintain home blood pressure log and if consistently above 130/80 mmHg titrate him up amlodipine  to 5 mg once daily.       CAD (coronary artery disease), s/p CABG 2 vessel [LIMA-LAD, left radial-D1 11/30/2023] - Primary   Recovering well post CABG.  Continue aspirin and Plavix tentatively for 12 months. Aspirin 81 mg after that indefinitely. Continue rosuvastatin 40 mg once daily, she will resume taking this.       Hyperlipidemia   Resume rosuvastatin 40 mg once daily. She inadvertently stopped taking this after discharge due to misunderstanding.  Agreeable to resume.      S/P CABG x 2    Return for follow-up in 3 months.  History of Present Illness:     Jasmin Gordon is a 53 y.o. female who is being seen today for follow-up visit. PCP is Jasmin Nest, MD. Last visit with me in the office was 11/23/2023.  history of diabetes mellitus, hypertension, obesity [currently on Ozempic and is considering gastric sleeve surgery], OSA, GERD, hyperlipidemia.   With symptoms of exertional chest pain, cardiac CT done 11/21/2022 was abnormal with proximal LAD disease hemodynamically significant by CT FFR.  Was scheduled for cardiac cath as outpatient. Echocardiogram 11/15/2023 noted normal biventricular function EF 60 to 65%, grade 1 diastolic dysfunction without significant valve abnormality With ongoing symptoms of chest discomfort he went to the ER 11/26/2023, negative troponins and was sent home.  Later with ongoing pain she went back to the ER same evening, admitted. Cardiac cath completed 11/27/2023 and patient noted proximal LAD 90% stenosis and diagonal 1 with ostial 70% stenosis. CT surgery was consulted and underwent CABG 11/30/2023 LIMA-LAD and left radial artery graft to first diagonal by Dr. Kerrin. IntraOp TEE noted normal LVEF greater than 65%, no significant valve abnormalities. Discharge from the hospital 12/03/2023.    Here for the visit for follow-up after her CABG. Mentions she has been recovering well. Has atypical sharp pain on left precordial area.  Has scheduled cardiac rehab visit December 2. Has in person follow-up with CT surgeon on December 8.  No shortness of breath, thromboembolism or nocturnal dyspnea. Sternotomy wound and left radial artery harvest wound healing well. No pedal edema. No palpitations, lightheadedness, dizziness or syncopal episodes.  She is taking her medications consistently however notes rosuvastatin she was not taking  as she was under the impression this was discontinued at the hospital discharge.    Blood pressures at home have been somewhat uncontrolled. Advised her to have the group  home device validated.  Past Medical History:  Diagnosis Date   Acute cystitis 08/11/2017   Anxiety and depression 08/11/2017   Atypical chest pain    BMI 40.0-44.9, adult (HCC)    CAD (coronary artery disease), abnormal cardiac CT 11-21-2023 11/22/2023   Cardiac CT coronary angiogram completed 11/21/2023 notes severe proximal LAD lesion which was hemodynamically significant on CT FFR assessment.  Calcium score 83.     Chest pain 08/11/2017   Constipation 08/14/2011   Cough 08/11/2017   Diabetes mellitus (HCC)    Diabetic gastroparesis associated with type 2 diabetes mellitus (HCC)    Dyspnea on exertion 08/11/2017   GERD (gastroesophageal reflux disease) 08/11/2017   H/O open surgical procurement of radial artery 12/03/2023   Hoarseness 08/11/2017   HTN (hypertension) 08/09/2011   Hyperlipidemia 11/23/2023   Intractable low back pain 08/07/2011   Left flank pain 08/11/2017   Lower urinary tract infectious disease 08/08/2011   IMO SNOMED Dx Update Oct 2024     Malaise and fatigue 08/11/2017   Morbid obesity (HCC)    Numbness of foot 08/11/2017   Obese    Oliguria 08/11/2017   Paresthesia 08/11/2017   Preop cardiovascular exam 11/13/2023   Radiculopathy, lumbosacral region 08/08/2011   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   Sciatica 08/11/2017   Sinus tachycardia by electrocardiogram    Sleep apnea    Type 2 diabetes mellitus with hyperlipidemia (HCC)    Unstable angina (HCC) 11/26/2023    Past Surgical History:  Procedure Laterality Date   CESAREAN SECTION     x 4   CORONARY ARTERY BYPASS GRAFT N/A 11/29/2023   Procedure: CORONARY ARTERY BYPASS GRAFTING (CABG) TIMES TWO USING LEFT RADIAL ARTERY AND LEFT INTERNAL MAMMARY ARTERY;  Surgeon: Jasmin Elspeth BROCKS, MD;  Location: [facility] OR;  Service: Open Heart Surgery;  Laterality: N/A;   hysterectomy surgery     INTRAOPERATIVE TRANSESOPHAGEAL ECHOCARDIOGRAM N/A 11/29/2023   Procedure: ECHOCARDIOGRAM, TRANSESOPHAGEAL, INTRAOPERATIVE;   Surgeon: Jasmin Elspeth BROCKS, MD;  Location: [facility] OR;  Service: Open Heart Surgery;  Laterality: N/A;   LEFT HEART CATH AND CORONARY ANGIOGRAPHY N/A 11/27/2023   Procedure: LEFT HEART CATH AND CORONARY ANGIOGRAPHY;  Surgeon: Verlin Lonni BIRCH, MD;  Location: [facility] INVASIVE CV LAB;  Service: Cardiovascular;  Laterality: N/A;   lower back surgery  2013,2007   disc    RADIAL ARTERY HARVEST Left 11/29/2023   Procedure: SURGICAL PROCUREMENT, ARTERY, RADIAL;  Surgeon: Jasmin Elspeth BROCKS, MD;  Location: [facility];  Service: Open Heart Surgery;  Laterality: Left;    Current Medications: Current Meds  Medication Sig   acetaminophen  (TYLENOL ) 500 MG tablet Take 1-2 tablets (500-1,000 mg total) by mouth every 6 (six) hours.   amLODipine  (NORVASC ) 2.5 MG tablet Take 1 tablet (2.5 mg total) by mouth daily.   aspirin EC 81 MG tablet Take 1 tablet (81 mg total) by mouth daily.   carvedilol (COREG) 6.25 MG tablet Take 1 tablet (6.25 mg total) by mouth 2 (two) times daily.   clopidogrel (PLAVIX) 75 MG tablet Take 1 tablet (75 mg total) by mouth daily.   guaiFENesin (MUCINEX) 600 MG 12 hr tablet Take 1 tablet (600 mg total) by mouth 2 (two) times daily.   insulin glargine (LANTUS) 100 UNIT/ML injection Inject 15 Units into the skin daily.   methocarbamol  (  ROBAXIN ) 500 MG tablet Take 500 mg by mouth 3 (three) times daily.   oxyCODONE  (OXY IR/ROXICODONE ) 5 MG immediate release tablet Take 1 tablet (5 mg total) by mouth every 4 (four) hours as needed for severe pain (pain score 7-10).   OZEMPIC, 1 MG/DOSE, 4 MG/3ML SOPN Inject 1 mg into the skin once a week. Thursdays   rosuvastatin (CRESTOR) 40 MG tablet Take 1 tablet (40 mg total) by mouth daily.     Allergies:   Bee venom   Social History   Socioeconomic History   Marital status: Married    Spouse name: Not on file   Number of children: 5   Years of education: college   Highest education level: Not on file  Occupational History   Occupation:  Company Secretary: [employer]  Tobacco Use   Smoking status: Never   Smokeless tobacco: Never  Substance and Sexual Activity   Alcohol use: Yes    Comment: Patient drinks 1-2 drinks a year.   Drug use: No   Sexual activity: Not on file  Other Topics Concern   Not on file  Social History Narrative   Not on file   Social Drivers of Health   Financial Resource Strain: Low Risk  (10/21/2023)   Received from [facility]   Overall Financial Resource Strain (CARDIA)    How hard is it for you to pay for the very basics like food, housing, medical care, and heating?: Not very hard  Food Insecurity: No Food Insecurity (11/27/2023)   Hunger Vital Sign    Worried About Running Out of Food in the Last Year: Never true    Ran Out of Food in the Last Year: Never true  Transportation Needs: No Transportation Needs (11/27/2023)   PRAPARE - Administrator, Civil Service (Medical): No    Lack of Transportation (Non-Medical): No  Physical Activity: Insufficiently Active (10/21/2023)   Received from [facility]   Exercise Vital Sign    On average, how many days per week do you engage in moderate to strenuous exercise (like a brisk walk)?: 1 day    On average, how many minutes do you engage in exercise at this level?: 60 min  Stress: No Stress Concern Present (10/21/2023)   Received from [employer] [facility] - Occupational Stress Questionnaire    Do you feel stress - tense, restless, nervous, or anxious, or unable to sleep at night because your mind is troubled all the time - these days?: Only a little  Social Connections: Socially Integrated (10/21/2023)   Received from [facility]   Social Network    How would you rate your social network (family, work, friends)?: Good participation with social networks     Family History: The patient's family history includes CAD in her mother; Diabetes in her mother and paternal  grandmother; High blood pressure in her maternal grandmother. ROS:   Please see the history of present illness.    All 14 point review of systems negative except as described per history of present illness.  EKGs/Labs/Other Studies Reviewed:    The following studies were reviewed today:   EKG:       Recent Labs: 11/30/2023: Magnesium  2.0 12/02/2023: BUN 10; Creatinine, Ser 0.56; Hemoglobin 11.6; Platelets 303; Potassium 3.5; Sodium 136  Recent Lipid Panel    Component Value Date/Time   CHOL 153 11/27/2023 0356   TRIG 121 11/27/2023 0356  HDL 31 (L) 11/27/2023 0356   CHOLHDL 4.9 11/27/2023 0356   VLDL 24 11/27/2023 0356   LDLCALC 98 11/27/2023 0356    Physical Exam:    VS:  BP (!) 136/96   Pulse 94   Ht 5' 2 (1.575 m)   Wt 232 lb 3.2 oz (105.3 kg)   SpO2 96%   BMI 42.47 kg/m     Wt Readings from Last 3 Encounters:  12/18/23 232 lb 3.2 oz (105.3 kg)  12/08/23 234 lb 8 oz (106.4 kg)  12/03/23 238 lb 1.6 oz (108 kg)     GENERAL:  Well nourished, well developed in no acute distress NECK: No JVD; No carotid bruits CARDIAC: Sternotomy site appears to be healing well.  RRR, S1 and S2 present, no murmurs, no rubs, no gallops CHEST:  Clear to auscultation without rales, wheezing or rhonchi  Extremities: No pitting pedal edema. Pulses bilaterally symmetric with dorsalis pedis 2+.  Left radial artery harvest site scar healing well.  Left ulnar 1+.  Right radial and ulnar 1+. NEUROLOGIC:  Alert and oriented x 3  Medication Adjustments/Labs and Tests Ordered: Current medicines are reviewed at length with the patient today.  Concerns regarding medicines are outlined above.  No orders of the defined types were placed in this encounter.  No orders of the defined types were placed in this encounter.   Signed, Jasmin jess Kobus, MD, MPH, [facility]. 12/18/2023 12:00 PM    [facility]

## 2023-12-18 NOTE — Patient Instructions (Signed)
 Medication Instructions:  Your physician recommends that you continue on your current medications as directed.  Resume taking your Crestor 40 mg daily. Please refer to the Current Medication list given to you today.  *If you need a refill on your cardiac medications before your next appointment, please call your pharmacy*   Lab Work: None ordered If you have labs (blood work) drawn today and your tests are completely normal, you will receive your results only by: MyChart Message (if you have MyChart) OR A paper copy in the mail If you have any lab test that is abnormal or we need to change your treatment, we will call you to review the results.   Testing/Procedures: None ordered   Follow-Up: At [facility], you and your health needs are our priority.  As part of our continuing mission to provide you with exceptional heart care, we have created designated Provider Care Teams.  These Care Teams include your primary Cardiologist (physician) and Advanced Practice Providers (APPs -  Physician Assistants and Nurse Practitioners) who all work together to provide you with the care you need, when you need it.  We recommend signing up for the patient portal called MyChart.  Sign up information is provided on this After Visit Summary.  MyChart is used to connect with patients for Virtual Visits (Telemedicine).  Patients are able to view lab/test results, encounter notes, upcoming appointments, etc.  Non-urgent messages can be sent to your provider as well.   To learn more about what you can do with MyChart, go to [email or web address].    Your next appointment:   3 month(s)  The format for your next appointment:   In Person  Provider:   [name], MD    Other Instructions none  Important Information About Sugar

## 2023-12-18 NOTE — Assessment & Plan Note (Signed)
 Recovering well post CABG.  Continue aspirin and Plavix tentatively for 12 months. Aspirin 81 mg after that indefinitely. Continue rosuvastatin 40 mg once daily, she will resume taking this.

## 2023-12-18 NOTE — Assessment & Plan Note (Signed)
 Suboptimal in the office today. Continue carvedilol 12.5 mg twice daily Continue amlodipine  2.5 mg once daily Maintain home blood pressure log and if consistently above 130/80 mmHg titrate him up amlodipine  to 5 mg once daily.

## 2023-12-18 NOTE — Assessment & Plan Note (Signed)
 Resume rosuvastatin 40 mg once daily. She inadvertently stopped taking this after discharge due to misunderstanding.  Agreeable to resume.

## 2023-12-21 ENCOUNTER — Other Ambulatory Visit: Payer: Self-pay

## 2023-12-21 MED ORDER — AMLODIPINE BESYLATE 2.5 MG PO TABS: 2.5000 mg | ORAL_TABLET | Freq: Every day | ORAL | 3 refills | Status: DC

## 2023-12-25 ENCOUNTER — Ambulatory Visit: Attending: Cardiology

## 2023-12-25 VITALS — BP 127/102 | HR 97 | Resp 18 | Ht 62.0 in | Wt 234.4 lb

## 2023-12-25 DIAGNOSIS — I10 Essential (primary) hypertension: Secondary | ICD-10-CM

## 2023-12-25 MED ORDER — AMLODIPINE BESYLATE 2.5 MG PO TABS: 2.5000 mg | ORAL_TABLET | Freq: Every day | ORAL | 3 refills | Status: DC

## 2023-12-25 NOTE — Progress Notes (Signed)
   Nurse Visit   Date of Encounter: 12/25/2023 ID: Jasmin Gordon, DOB [birth year], MRN [id number]  PCP:  [name], MD   [facility] Providers Cardiologist:  Alean SAUNDERS Madireddy, MD      Visit Details   VS:  BP 134/89 (BP Location: Right Arm, Patient Position: Sitting, Cuff Size: Normal)   Pulse 97   Resp 18   Ht 5' 2 (1.575 m)   Wt 234 lb 6.4 oz (106.3 kg)   SpO2 95%   BMI 42.87 kg/m  , BMI Body mass index is 42.87 kg/m.  Wt Readings from Last 3 Encounters:  12/25/23 234 lb 6.4 oz (106.3 kg)  12/18/23 232 lb 3.2 oz (105.3 kg)  12/08/23 234 lb 8 oz (106.4 kg)     Reason for visit: BP validation Performed today: Vitals, Provider consulted:Madireddy, and Education Changes (medications, testing, etc.) : No changes Length of Visit: 15 minutes    Medications Adjustments/Labs and Tests Ordered: No orders of the defined types were placed in this encounter.  Meds ordered this encounter  Medications   amLODipine  (NORVASC ) 2.5 MG tablet    Sig: Take 1 tablet (2.5 mg total) by mouth daily.    Dispense:  90 tablet    Refill:  3     Signed, Melene Meissner, RN  12/25/2023 11:22 AM

## 2023-12-25 NOTE — Addendum Note (Signed)
 Addended by: Macintyre Alexa, ANNABELLA L on: 12/25/2023 07:20 AM   Modules accepted: Orders

## 2023-12-29 ENCOUNTER — Other Ambulatory Visit: Payer: Self-pay | Admitting: Physician Assistant

## 2023-12-29 MED ORDER — OXYCODONE HCL 5 MG PO TABS: 5.0000 mg | ORAL_TABLET | Freq: Four times a day (QID) | ORAL | 0 refills | Status: AC | PRN

## 2023-12-29 NOTE — Progress Notes (Signed)
 Jasmin Gordon contacted the after hours answering service with a request for pain medication refill.  She is S/P CABG x 2 performed by Dr. Kerrin on 11/29/2023.  At time of discharge she was given an RX for Oxycodone  5 mg every 4 hours as needed for pain which was filled on 12/03/2023.  She has been taking medication appropriately.  She is currently only using for nighttime.  She was instructed to use Tylenol  as able.    She will be given a refill on her Oxycodone  5 mg for 1 tablet every 6 hours as needed for pain dispense 30 with no refills.   Rocky Shad, PA-C 3:51 PM 12/29/23

## 2024-01-04 ENCOUNTER — Other Ambulatory Visit: Payer: Self-pay

## 2024-01-04 MED ORDER — CARVEDILOL 6.25 MG PO TABS: 6.2500 mg | ORAL_TABLET | Freq: Two times a day (BID) | ORAL | 3 refills | Status: DC

## 2024-01-05 ENCOUNTER — Other Ambulatory Visit: Payer: Self-pay | Admitting: Thoracic Surgery (Cardiothoracic Vascular Surgery)

## 2024-01-05 DIAGNOSIS — Z951 Presence of aortocoronary bypass graft: Secondary | ICD-10-CM

## 2024-01-08 ENCOUNTER — Ambulatory Visit: Admission: RE | Admit: 2024-01-08 | Discharge: 2024-01-08 | Payer: Self-pay

## 2024-01-08 ENCOUNTER — Ambulatory Visit: Payer: Self-pay

## 2024-01-08 VITALS — BP 139/80 | HR 90 | Resp 20 | Ht 62.0 in | Wt 240.0 lb

## 2024-01-08 DIAGNOSIS — Z951 Presence of aortocoronary bypass graft: Secondary | ICD-10-CM

## 2024-01-08 NOTE — Patient Instructions (Signed)

## 2024-01-08 NOTE — Progress Notes (Signed)
 [address] [place] Jasmin Gordon [address]             [phone]       HPI:  Patient returns for routine postoperative follow-up having undergone Coronary artery bypass grafting x2, Left internal mammary artery to LAD, Left radial artery to first diagonal on 11/29/2023 with Dr. Kerrin.  The patient's early postoperative recovery while in the hospital was notable for being routinely diuresed. She was started on plavix  and amlodipine . She was stable for discharge home on 12/03/2023.  She presents to the clinic today and reports that she is doing well.  Her pain is controlled with tylenol  during the day and oxycodone  at night.  She has been trying to stay active and will start cardiac rehab tomorrow.  She has had some constipation and has been trying to increase her water  intake to help.  Her incision sites have been healing appropriately.  She denies chest pain, shortness of breath and lower leg edema.     Allergies as of 01/08/2024       Reactions   Bee Venom Anaphylaxis        Medication List        Accurate as of January 08, 2024 11:09 AM. If you have any questions, ask your nurse or doctor.          acetaminophen  500 MG tablet Commonly known as: TYLENOL  Take 1-2 tablets (500-1,000 mg total) by mouth every 6 (six) hours.   amLODipine  2.5 MG tablet Commonly known as: NORVASC  Take 1 tablet (2.5 mg total) by mouth daily.   aspirin  EC 81 MG tablet Take 1 tablet (81 mg total) by mouth daily.   carvedilol  12.5 MG tablet Commonly known as: COREG  Take 12.5 mg by mouth 2 (two) times daily. What changed: Another medication with the same name was removed. Continue taking this medication, and follow the directions you see here.   clopidogrel  75 MG tablet Commonly known as: Plavix  Take 1 tablet (75 mg total) by mouth daily.   guaiFENesin  600 MG 12 hr tablet Commonly known as: MUCINEX  Take 1 tablet (600 mg total) by mouth 2 (two) times daily.    Lantus  100 UNIT/ML injection Generic drug: insulin  glargine Inject 15 Units into the skin daily.   oxyCODONE  5 MG immediate release tablet Commonly known as: Oxy IR/ROXICODONE  Take 1 tablet (5 mg total) by mouth every 6 (six) hours as needed for severe pain (pain score 7-10).   Ozempic (1 MG/DOSE) 4 MG/3ML Sopn Generic drug: Semaglutide (1 MG/DOSE) Inject 1 mg into the skin once a week. Thursdays   rosuvastatin  40 MG tablet Commonly known as: CRESTOR  Take 1 tablet (40 mg total) by mouth daily.         ROS  Review of Systems  Constitutional:  Negative for malaise/fatigue.  Respiratory:  Negative for cough and shortness of breath.   Cardiovascular:  Negative for chest pain, palpitations and leg swelling.     BP 139/80   Pulse 90   Resp 20   Ht 5' 2 (1.575 m)   Wt 240 lb (108.9 kg)   SpO2 96% Comment: RA  BMI 43.90 kg/m    Physical Exam Constitutional:      Appearance: Normal appearance.  HENT:     Head: Normocephalic and atraumatic.  Cardiovascular:     Rate and Rhythm: Normal rate and regular rhythm.     Heart sounds: Normal heart sounds, S1 normal  and S2 normal.  Pulmonary:     Effort: Pulmonary effort is normal.     Breath sounds: Normal breath sounds.  Musculoskeletal:     Cervical back: Normal range of motion.  Skin:    General: Skin is warm and dry.      Neurological:     General: No focal deficit present.     Mental Status: She is alert.      Imaging: CLINICAL DATA:  History recent bypass surgery.   EXAM: CHEST - 2 VIEW   COMPARISON:  Chest x-ray 12/08/2023   FINDINGS: Surgical changes from bypass surgery with median sternotomy wires. No complicating features.   The cardiac silhouette, mediastinal and contours are stable. Low lung volumes with vascular crowding and streaky atelectasis but the left pleural effusion and left lower lobe atelectasis have resolved. No pulmonary edema or pneumothorax. The bony thorax is intact.    IMPRESSION: 1. Low lung volumes with vascular crowding and streaky atelectasis. 2. Resolution of left pleural effusion and left lower lobe atelectasis.     Electronically Signed   By: MYRTIS Stammer M.D.   On: 01/08/2024 11:05   Assessment/Plan:  S/P CABG x 2 -We reviewed today's chest x ray. Left pleural effusion resolved. She is to continue to use her incentive spirometer -We discussed driving and she is able to start.  First time driving should be a short distance in the day time and she can increase form there -She is cleared to participate in cardiac rehab at this time, she has orientation tomorrow -Sternal precautions can be lifted since she is 6 weeks from surgery.  She should continue to not lift over 10 pounds until a full 3 months from surgery -She has been seen by cardiology.  Her carvedilol  was increased to 12.5 mg BID, which she has not started yet.  She is to continue with 2.5 mg amlodipine , plavix  75 mg, aspirin  81 mg, and 40 mg rosuvastatin .  She has kept a home BP log and reports readings 130s/80s.  She is to start increased dose of carvedilol  and if BP readings are still elevated then cardiology recommended increasing amlodipine  to 5 mg daily.  Informed her that she should let her cardiologist know if she increases her dosing.  -For constipation she can try OTC medications for constipation. She should continue to stay hydrated.  Discussed discontinuation of oxycodone  at night to help with constipation.  She should try to use tylenol  only for pain -Follow up with [facility] as needed   Manuelita CHRISTELLA Rough, PA-C 11:09 AM 01/08/24

## 2024-01-15 ENCOUNTER — Ambulatory Visit

## 2024-01-15 VITALS — BP 132/88 | HR 98 | Ht 62.0 in | Wt 240.5 lb

## 2024-01-15 DIAGNOSIS — I25118 Atherosclerotic heart disease of native coronary artery with other forms of angina pectoris: Secondary | ICD-10-CM | POA: Diagnosis not present

## 2024-01-15 DIAGNOSIS — E782 Mixed hyperlipidemia: Secondary | ICD-10-CM

## 2024-01-15 DIAGNOSIS — I10 Essential (primary) hypertension: Secondary | ICD-10-CM

## 2024-01-15 MED ORDER — AMLODIPINE BESYLATE 5 MG PO TABS: 5.0000 mg | ORAL_TABLET | Freq: Every day | ORAL | 3 refills | Status: AC

## 2024-01-15 NOTE — Patient Instructions (Signed)
 Medication Instructions:  Your physician has recommended you make the following change in your medication:   Increase your Amlodipine  to 5 mg daily  *If you need a refill on your cardiac medications before your next appointment, please call your pharmacy*   Lab Work: None ordered If you have labs (blood work) drawn today and your tests are completely normal, you will receive your results only by: MyChart Message (if you have MyChart) OR A paper copy in the mail If you have any lab test that is abnormal or we need to change your treatment, we will call you to review the results.   Testing/Procedures: None ordered   Follow-Up: At [facility], you and your health needs are our priority.  As part of our continuing mission to provide you with exceptional heart care, we have created designated Provider Care Teams.  These Care Teams include your primary Cardiologist (physician) and Advanced Practice Providers (APPs -  Physician Assistants and Nurse Practitioners) who all work together to provide you with the care you need, when you need it.  We recommend signing up for the patient portal called MyChart.  Sign up information is provided on this After Visit Summary.  MyChart is used to connect with patients for Virtual Visits (Telemedicine).  Patients are able to view lab/test results, encounter notes, upcoming appointments, etc.  Non-urgent messages can be sent to your provider as well.   To learn more about what you can do with MyChart, go to [email or web address].    Your next appointment:   3-4 month(s)  The format for your next appointment:   In Person  Provider:   [name], MD    Other Instructions none  Important Information About Sugar

## 2024-01-15 NOTE — Assessment & Plan Note (Signed)
 Recovering well post CABG. Started cardiac rehab past week. Mild sternotomy site pain. Using oxycodone . Likely contributing to her constipation.  Advised her to cut down oxycodone  use and continue with hydration to help with her constipation symptom relief.  Continue aspirin  and Plavix  tentatively for 1 year.  Okay to return to her work with limitations weight restriction to below 10 pounds for at least 3 months post CABG.  Okay for a travel so long as she can stick with weight lifting restrictions.

## 2024-01-15 NOTE — Progress Notes (Signed)
 Cardiology Consultation:    Date:  01/15/2024   ID:  Jasmin Gordon, DOB [birth year], MRN [id number]  PCP:  Jasmin Nest, MD  Cardiologist:  Alean SAUNDERS Jarret Torre, MD   Referring MD: Jasmin Nest, MD   No chief complaint on file.    ASSESSMENT AND PLAN:   Jasmin Gordon 53 year old woman with history of  CAD with symptoms of shortness of breath, followed by abnormal cardiac CT 11/21/2022, scheduled for elective cardiac cath, presented to the ER for ongoing chest pain, unremarkable troponins, but with ongoing symptoms proceeded with inpatient cardiac cath 11/27/2023, followed by CT surgery evaluation and CABG 11/30/2023 LIMA-LAD and left radial artery graft to diagonal 1. Also has history of diabetes mellitus, hypertension, obesity, OSA, GERD, hyperlipidemia. Echocardiogram 11/15/2023 noted normal biventricular function EF 60 to 65%, grade 1 diastolic dysfunction without significant valve abnormality IntraOp TEE noted normal LVEF greater than 65%, no significant valve abnormalities.   Here for follow-up visit.  Problem List Items Addressed This Visit       Cardiovascular and Mediastinum   HTN (hypertension) - Primary   Suboptimal. Target below 130/80 mmHg. Continue carvedilol  12.5 mg twice daily.  She has been on this dose at least for the last week. Titrate up amlodipine  to 5 mg once daily.       Relevant Medications   amLODipine  (NORVASC ) 5 MG tablet   CAD (coronary artery disease), s/p CABG 2 vessel [LIMA-LAD, left radial-D1 11/30/2023]   Recovering well post CABG. Started cardiac rehab past week. Mild sternotomy site pain. Using oxycodone . Likely contributing to her constipation.  Advised her to cut down oxycodone  use and continue with hydration to help with her constipation symptom relief.  Continue aspirin  and Plavix  tentatively for 1 year.  Okay to return to her work with limitations weight restriction to below 10 pounds for at least 3 months post  CABG.  Okay for a travel so long as she can stick with weight lifting restrictions.        Relevant Medications   amLODipine  (NORVASC ) 5 MG tablet     Other   Hyperlipidemia   Continue rosuvastatin  40 mg once daily. Tolerating well.      Relevant Medications   amLODipine  (NORVASC ) 5 MG tablet   Will return for follow-up tentatively in 3 to 4 months.    History of Present Illness:    Jasmin Gordon is a 53 y.o. female who is being seen today for follow-up visit. PCP is Jasmin Nest, MD. Last visit with me in the office was 12/18/2023.  Here for the visit today accompanied by her sister.  Has history of  CAD with symptoms of shortness of breath, followed by abnormal cardiac CT 11/21/2022, scheduled for elective cardiac cath, presented to the ER for ongoing chest pain, unremarkable troponins, but with ongoing symptoms proceeded with inpatient cardiac cath 11/27/2023, followed by CT surgery evaluation and CABG 11/30/2023 LIMA-LAD and left radial artery graft to diagonal 1. Also has history of diabetes mellitus, hypertension, obesity, OSA, GERD, hyperlipidemia. Echocardiogram 11/15/2023 noted normal biventricular function EF 60 to 65%, grade 1 diastolic dysfunction without significant valve abnormality IntraOp TEE noted normal LVEF greater than 65%, no significant valve abnormalities.    Mentions overall she has been doing well. Had a follow-up visit with CT surgeons 01/08/2024.  Has been cleared for driving and cardiac rehab and had 3 sessions of cardiac rehab thus far.  Mentions blood pressures at home typically systolic in 130s and diastolic in 80s. Has  increased carvedilol  to 12.5 mg twice daily last week. Continues on amlodipine  2.5 mg once daily.  Continues to have symptoms from pain over the sternotomy site which she describes as sharp discomfort typically worse towards the end of the day.  She has been using Tylenol  during the daytime and at night she has been taking  oxycodone  once. Has been dealing with symptoms of constipation not improved with fiber rich food and increase fluid intake through the day.  Denies any palpitations, lightheadedness, dizziness or syncopal episodes.  Past Medical History:  Diagnosis Date   Acute cystitis 08/11/2017   Anxiety and depression 08/11/2017   Atypical chest pain    BMI 40.0-44.9, adult (HCC)    CAD (coronary artery disease), abnormal cardiac CT 11-21-2023 11/22/2023   Cardiac CT coronary angiogram completed 11/21/2023 notes severe proximal LAD lesion which was hemodynamically significant on CT FFR assessment.  Calcium  score 83.     Chest pain 08/11/2017   Constipation 08/14/2011   Cough 08/11/2017   Diabetes mellitus (HCC)    Diabetic gastroparesis associated with type 2 diabetes mellitus (HCC)    Dyspnea on exertion 08/11/2017   GERD (gastroesophageal reflux disease) 08/11/2017   H/O open surgical procurement of radial artery 12/03/2023   Hoarseness 08/11/2017   HTN (hypertension) 08/09/2011   Hyperlipidemia 11/23/2023   Intractable low back pain 08/07/2011   Left flank pain 08/11/2017   Lower urinary tract infectious disease 08/08/2011   IMO SNOMED Dx Update Oct 2024     Malaise and fatigue 08/11/2017   Morbid obesity (HCC)    Numbness of foot 08/11/2017   Obese    Oliguria 08/11/2017   Paresthesia 08/11/2017   Preop cardiovascular exam 11/13/2023   Radiculopathy, lumbosacral region 08/08/2011   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   Sciatica 08/11/2017   Sinus tachycardia by electrocardiogram    Sleep apnea    Type 2 diabetes mellitus with hyperlipidemia (HCC)    Unstable angina (HCC) 11/26/2023    Past Surgical History:  Procedure Laterality Date   CESAREAN SECTION     x 4   CORONARY ARTERY BYPASS GRAFT N/A 11/29/2023   Procedure: CORONARY ARTERY BYPASS GRAFTING (CABG) TIMES TWO USING LEFT RADIAL ARTERY AND LEFT INTERNAL MAMMARY ARTERY;  Surgeon: Kerrin Elspeth BROCKS, MD;  Location: [facility] OR;  Service:  Open Heart Surgery;  Laterality: N/A;   hysterectomy surgery     INTRAOPERATIVE TRANSESOPHAGEAL ECHOCARDIOGRAM N/A 11/29/2023   Procedure: ECHOCARDIOGRAM, TRANSESOPHAGEAL, INTRAOPERATIVE;  Surgeon: Kerrin Elspeth BROCKS, MD;  Location[facility];  Service: Open Heart Surgery;  Laterality: N/A;   LEFT HEART CATH AND CORONARY ANGIOGRAPHY N/A 11/27/2023   Procedure: LEFT HEART CATH AND CORONARY ANGIOGRAPHY;  Surgeon: Verlin Lonni BIRCH, MD;  Location: [facility] INVASIVE CV LAB;  Service: Cardiovascular;  Laterality: N/A;   lower back surgery  2013,2007   disc    RADIAL ARTERY HARVEST Left 11/29/2023   Procedure: SURGICAL PROCUREMENT, ARTERY, RADIAL;  Surgeon: Kerrin Elspeth BROCKS, MD;  Location: [facility] OR;  Service: Open Heart Surgery;  Laterality: Left;    Current Medications: Active Medications[1]   Allergies:   Bee venom   Social History   Socioeconomic History   Marital status: Married    Spouse name: Not on file   Number of children: 5   Years of education: college   Highest education level: Not on file  Occupational History   Occupation: social research officer, government    Employer: [employer]  Tobacco Use   Smoking status: Never  Smokeless tobacco: Never  Substance and Sexual Activity   Alcohol use: Yes    Comment: Patient drinks 1-2 drinks a year.   Drug use: No   Sexual activity: Not on file  Other Topics Concern   Not on file  Social History Narrative   Not on file   Social Drivers of Health   Tobacco Use: Low Risk (01/15/2024)   Patient History    Smoking Tobacco Use: Never    Smokeless Tobacco Use: Never    Passive Exposure: Not on file  Financial Resource Strain: Low Risk (10/21/2023)   Received from [facility]   Overall Financial Resource Strain (CARDIA)    How hard is it for you to pay for the very basics like food, housing, medical care, and heating?: Not very hard  Food Insecurity: No Food Insecurity (11/27/2023)   Epic    Worried About Radiation Protection Practitioner of Food in the  Last Year: Never true    Ran Out of Food in the Last Year: Never true  Transportation Needs: No Transportation Needs (11/27/2023)   Epic    Lack of Transportation (Medical): No    Lack of Transportation (Non-Medical): No  Physical Activity: Insufficiently Active (10/21/2023)   Received from [facility]   Exercise Vital Sign    On average, how many days per week do you engage in moderate to strenuous exercise (like a brisk walk)?: 1 day    On average, how many minutes do you engage in exercise at this level?: 60 min  Stress: No Stress Concern Present (10/21/2023)   Received from [facility] of Occupational Health - Occupational Stress Questionnaire    Do you feel stress - tense, restless, nervous, or anxious, or unable to sleep at night because your mind is troubled all the time - these days?: Only a little  Social Connections: Socially Integrated (10/21/2023)   Received from [facility]   Social Network    How would you rate your social network (family, work, friends)?: Good participation with social networks  Depression (PHQ2-9): Not on file  Alcohol Screen: Not on file  Housing: Low Risk (11/27/2023)   Epic    Unable to Pay for Housing in the Last Year: No    Number of Times Moved in the Last Year: 0    Homeless in the Last Year: No  Utilities: Not At Risk (11/27/2023)   Epic    Threatened with loss of utilities: No  Health Literacy: Not on file     Family History: The patient's family history includes CAD in her mother; Diabetes in her mother and paternal grandmother; High blood pressure in her maternal grandmother. ROS:   Please see the history of present illness.    All 14 point review of systems negative except as described per history of present illness.  EKGs/Labs/Other Studies Reviewed:    The following studies were reviewed today:   EKG:       Recent Labs: 11/30/2023: Magnesium  2.0 12/02/2023: BUN 10; Creatinine, Ser 0.56; Hemoglobin  11.6; Platelets 303; Potassium 3.5; Sodium 136  Recent Lipid Panel    Component Value Date/Time   CHOL 153 11/27/2023 0356   TRIG 121 11/27/2023 0356   HDL 31 (L) 11/27/2023 0356   CHOLHDL 4.9 11/27/2023 0356   VLDL 24 11/27/2023 0356   LDLCALC 98 11/27/2023 0356    Physical Exam:    VS:  BP 132/88   Pulse 98   Ht 5' 2 (1.575 m)  Wt 240 lb 8 oz (109.1 kg)   SpO2 97%   BMI 43.99 kg/m     Wt Readings from Last 3 Encounters:  01/15/24 240 lb 8 oz (109.1 kg)  01/08/24 240 lb (108.9 kg)  12/25/23 234 lb 6.4 oz (106.3 kg)     GENERAL:  Well nourished, well developed in no acute distress NECK: No JVD; No carotid bruits CARDIAC: Sternotomy scar healed well.  RRR, S1 and S2 present, no murmurs, no rubs, no gallops CHEST:  Clear to auscultation without rales, wheezing or rhonchi  Extremities: No pitting pedal edema.  Left radial artery harvest site scar healed well.  Left ulnar 1+.  Right radial and ulnar 1+. NEUROLOGIC:  Alert and oriented x 3  Medication Adjustments/Labs and Tests Ordered: Current medicines are reviewed at length with the patient today.  Concerns regarding medicines are outlined above.  No orders of the defined types were placed in this encounter.  Meds ordered this encounter  Medications   amLODipine  (NORVASC ) 5 MG tablet    Sig: Take 1 tablet (5 mg total) by mouth daily.    Dispense:  90 tablet    Refill:  3    Signed, Pauline Pegues reddy Amyjo Mizrachi, MD, MPH, [facility]. 01/15/2024 3:59 PM    [facility]    [1]  Current Meds  Medication Sig   acetaminophen  (TYLENOL ) 500 MG tablet Take 1-2 tablets (500-1,000 mg total) by mouth every 6 (six) hours.   aspirin  EC 81 MG tablet Take 1 tablet (81 mg total) by mouth daily.   carvedilol  (COREG ) 12.5 MG tablet Take 12.5 mg by mouth 2 (two) times daily.   clopidogrel  (PLAVIX ) 75 MG tablet Take 1 tablet (75 mg total) by mouth daily.   guaiFENesin  (MUCINEX ) 600 MG 12 hr tablet Take 1 tablet (600  mg total) by mouth 2 (two) times daily.   insulin  glargine (LANTUS ) 100 UNIT/ML injection Inject 15 Units into the skin daily.   oxyCODONE  (OXY IR/ROXICODONE ) 5 MG immediate release tablet Take 1 tablet (5 mg total) by mouth every 6 (six) hours as needed for severe pain (pain score 7-10).   OZEMPIC, 1 MG/DOSE, 4 MG/3ML SOPN Inject 1 mg into the skin once a week. Thursdays   rosuvastatin  (CRESTOR ) 40 MG tablet Take 1 tablet (40 mg total) by mouth daily.   [DISCONTINUED] amLODipine  (NORVASC ) 2.5 MG tablet Take 1 tablet (2.5 mg total) by mouth daily.

## 2024-01-15 NOTE — Assessment & Plan Note (Signed)
 Continue rosuvastatin  40 mg once daily. Tolerating well.

## 2024-01-15 NOTE — Assessment & Plan Note (Signed)
 Suboptimal. Target below 130/80 mmHg. Continue carvedilol  12.5 mg twice daily.  She has been on this dose at least for the last week. Titrate up amlodipine  to 5 mg once daily.

## 2024-02-04 ENCOUNTER — Other Ambulatory Visit: Payer: Self-pay | Admitting: Physician Assistant

## 2024-02-07 MED ORDER — CARVEDILOL 25 MG PO TABS: 25.0000 mg | ORAL_TABLET | Freq: Two times a day (BID) | ORAL | 3 refills | Status: AC

## 2024-02-07 MED ORDER — CLOPIDOGREL BISULFATE 75 MG PO TABS: 75.0000 mg | ORAL_TABLET | Freq: Every day | ORAL | 3 refills | Status: DC

## 2024-02-09 ENCOUNTER — Telehealth (HOSPITAL_BASED_OUTPATIENT_CLINIC_OR_DEPARTMENT_OTHER): Payer: Self-pay

## 2024-02-09 NOTE — Telephone Encounter (Signed)
"  ° °  Pre-operative Risk Assessment    Patient Name: Jasmin Gordon  DOB: 11-Sep-1970 MRN: [id number]   Date of last office visit: 01/14/25 with Madireddy  Date of next office visit: 03/19/24 with Madireddy   Request for Surgical Clearance    Procedure:    Date of Surgery:  Clearance 02/14/24                                 Surgeon:  NA Surgeon's Group or Practice Name:  [facility]  Phone number:  [phone] Fax number:  [phone]   Type of Clearance Requested:   - Medical  - Pharmacy:  Hold Aspirin  and Clopidogrel  (Plavix ) not indicated   Type of Anesthesia:  Local    Additional requests/questions:    Jasmin Gordon   02/09/2024, 8:28 AM   "

## 2024-02-09 NOTE — Telephone Encounter (Signed)
 Patient is having teeth extracted but it was not noted on the clearance how many teeth the patient is having extracted. The office is not open today,02/09/23, to confirm. Office will open back up on Monday 02/12/24. I will call then.

## 2024-02-12 ENCOUNTER — Other Ambulatory Visit: Payer: Self-pay

## 2024-02-13 NOTE — Telephone Encounter (Signed)
"  ° °  Primary Cardiologist: Alean SAUNDERS Madireddy, MD  Chart reviewed as part of pre-operative protocol coverage. Simple dental extractions, deep cleanings, and fillings are considered low risk procedures per guidelines and generally do not require any specific cardiac clearance. Would recommend with recent CABG, that patient wait 3 months (after February 29, 2024)  before proceeding with deep cleaning due to risk of infection.   It is also generally accepted that for simple extractions (1-2 teeth) and dental cleanings, there is no need to interrupt blood thinner therapy.   SBE prophylaxis is not required for the patient.  I will route this recommendation to the requesting party via Epic fax function and remove from pre-op pool.  Please call with questions.  Rosaline EMERSON Bane, NP-C 02/13/2024, 2:40 PM [address] [place], [place] [address] Office [phone] Fax [phone]     "

## 2024-02-13 NOTE — Telephone Encounter (Signed)
 Called [facility] [place] to clarify what the clearance was for. At this time, pt only needs x-rays and a simple or possible deep (SRP) cleaning. If SPR, they will use local anesthesia. She stated that pt might need future fillings and extractions so I informed her that we cannot do blanket clearances and to send clearances as treatment follows.

## 2024-02-14 MED ORDER — CLOPIDOGREL BISULFATE 75 MG PO TABS: 75.0000 mg | ORAL_TABLET | Freq: Every day | ORAL | 3 refills | Status: AC

## 2024-03-19 ENCOUNTER — Ambulatory Visit
